# Patient Record
Sex: Female | Born: 1950 | Race: White | Hispanic: No | Marital: Married | Smoking: Never smoker
Health system: Southern US, Community
[De-identification: ages and names within clinical notes are randomized; demographics above are authoritative.]

## PROBLEM LIST (undated history)

## (undated) DIAGNOSIS — Z86718 Personal history of other venous thrombosis and embolism: Secondary | ICD-10-CM

## (undated) DIAGNOSIS — M549 Dorsalgia, unspecified: Secondary | ICD-10-CM

## (undated) DIAGNOSIS — M1990 Unspecified osteoarthritis, unspecified site: Secondary | ICD-10-CM

## (undated) DIAGNOSIS — M479 Spondylosis, unspecified: Secondary | ICD-10-CM

## (undated) DIAGNOSIS — M5126 Other intervertebral disc displacement, lumbar region: Secondary | ICD-10-CM

## (undated) DIAGNOSIS — K219 Gastro-esophageal reflux disease without esophagitis: Secondary | ICD-10-CM

## (undated) DIAGNOSIS — M4800 Spinal stenosis, site unspecified: Secondary | ICD-10-CM

## (undated) DIAGNOSIS — I10 Essential (primary) hypertension: Secondary | ICD-10-CM

## (undated) HISTORY — PX: OTHER SURGICAL HISTORY: SHX169

## (undated) HISTORY — PX: UPPER GI ENDOSCOPY: SHX6162

## (undated) HISTORY — PX: TUBAL LIGATION: SHX77

## (undated) HISTORY — PX: TOTAL KNEE ARTHROPLASTY: SHX125

## (undated) HISTORY — PX: BREAST BIOPSY: SHX20

---

## 2000-11-22 ENCOUNTER — Encounter: Payer: Self-pay | Admitting: Specialist

## 2000-12-03 ENCOUNTER — Inpatient Hospital Stay (HOSPITAL_COMMUNITY): Admission: RE | Admit: 2000-12-03 | Discharge: 2000-12-07 | Payer: Self-pay | Admitting: Specialist

## 2004-04-07 ENCOUNTER — Encounter: Admission: RE | Admit: 2004-04-07 | Discharge: 2004-04-07 | Payer: Self-pay | Admitting: General Surgery

## 2004-04-20 HISTORY — PX: JOINT REPLACEMENT: SHX530

## 2005-05-29 ENCOUNTER — Ambulatory Visit (HOSPITAL_COMMUNITY): Admission: RE | Admit: 2005-05-29 | Discharge: 2005-05-29 | Payer: Self-pay | Admitting: Unknown Physician Specialty

## 2008-06-04 ENCOUNTER — Ambulatory Visit (HOSPITAL_COMMUNITY): Admission: RE | Admit: 2008-06-04 | Discharge: 2008-06-04 | Payer: Self-pay | Admitting: Unknown Physician Specialty

## 2009-06-10 ENCOUNTER — Ambulatory Visit (HOSPITAL_COMMUNITY): Admission: RE | Admit: 2009-06-10 | Discharge: 2009-06-10 | Payer: Self-pay | Admitting: Unknown Physician Specialty

## 2010-05-20 ENCOUNTER — Other Ambulatory Visit: Payer: Self-pay

## 2010-05-20 DIAGNOSIS — Z139 Encounter for screening, unspecified: Secondary | ICD-10-CM

## 2010-06-16 ENCOUNTER — Ambulatory Visit (HOSPITAL_COMMUNITY): Payer: Self-pay

## 2010-06-16 ENCOUNTER — Ambulatory Visit (HOSPITAL_COMMUNITY)
Admission: RE | Admit: 2010-06-16 | Discharge: 2010-06-16 | Disposition: A | Discharge status: Home / Self Care | Payer: Managed Care, Other (non HMO) | Source: Ambulatory Visit | Attending: Unknown Physician Specialty | Admitting: Unknown Physician Specialty

## 2010-06-16 DIAGNOSIS — Z1231 Encounter for screening mammogram for malignant neoplasm of breast: Secondary | ICD-10-CM | POA: Insufficient documentation

## 2010-06-16 DIAGNOSIS — Z139 Encounter for screening, unspecified: Secondary | ICD-10-CM

## 2010-09-05 NOTE — Discharge Summary (Signed)
[facility]  Patient:    Danielle Gray, Danielle Gray Visit Number: [id number] MRN: [id number]          Service Type: SUR Location: [id number] Attending Physician:  Erasmo Leventhal Dictated by:   Sammuel Cooper Mahar, P.A.-C. Admit Date:  12/03/2000 Discharge Date: 12/07/2000                             Discharge Summary  DATE OF BIRTH:  [birth year]  HISTORY OF PRESENT ILLNESS:  Patient is a 60 year old female.  ADMITTING DIAGNOSES: 1. Post-traumatic right knee osteoarthritis, end stage. 2. Anxiety and depression. 3. History of superficial thrombophlebitis.  DISCHARGE DIAGNOSES: 1. Status post right total knee arthroplasty. 2. Postoperative hemorrhagic anemia which is asymptomatic and stabilized, did    not require any transfusions. 3. Anxiety and depression. 4. History of superficial thrombophlebitis.  PROCEDURE:  Right total knee arthroplasty by Dr. Valma Cava with the assistance of Ralene Bathe, P.A.-C.  ANESTHESIA USED:  General.  CONSULTS:  None.  HISTORY OF PRESENT ILLNESS:  Patient is a 60 year old female who has had progressive problems involving her right knee.  She gives a history of what sounds to be a knee injury that was treated by a knee arthroscopy in another town.  The details of the events are unknown.  However, she has been followed and, ultimately, has come to our office for further treatment involving her right knee.  She has known osteoarthritis.  Standing x-rays taken in our office shows significantly advanced arthritis, tricompartmental in nature. She had bone-on-bone deformity and is having significant pain limiting her activities of daily living.  She had undergone several interarticular injections of cortisone which have failed to relieve her pain.  She also failed on oral anti-inflammatories.  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  LABORATORY DATA:  Preadmission labs on November 22, 2000:  CBC was within normal limits.  PT and INR on the same date and PTT were 13.3, 1.0, and 36 respectively, all within normal limits.  A complete metabolic panel on November 22, 2000, was within normal limits.  UA on the same date was negative. On December 03, 2000, blood type was O, Rh type positive, antibody screen negative.  On December 04, 2000, H&H 11.4 and 32.7 respectively, dropping down to 9.8 and 28.5 on December 05, 2000.  This was the low point, did stabilize there.  On December 06, 2000, it had increased to 10.2 and 29.6 respectively. PT and INR were monitored postoperatively while on Coumadin reaching therapeutic range on December 06, 2000, of 21.0 and 2.2 respectively.  On December 04, 2000, a basic metabolic panel was within normal limits with the exception of a glucose of 144 and a calcium of 8.3.  EKG done on November 22, 2000, revealed normal sinus rhythm.  Additional comments:  No old tracing to compare.  This was confirmed by Charlton Haws, M.D.  X-rays done on November 22, 2000, of the chest revealed no active cardiopulmonary disease and x-rays of the right knee revealed severe degenerative changes.  HOSPITAL COURSE:  On December 03, 2000, the patient was taken to the operating room to undergo the above-listed procedures by Dr. Elvera Lennox. Valma Cava with the assistance of [facility], P.A.-C.  Patient was put under general anesthesia.  Patient had tolerated surgery very well.  There were no intraoperative complications and  was transferred to the recovery room in stable condition.  Initially postoperatively, the patient was placed on both Coumadin as well as Lovenox for DVT prevention.  Lovenox was continued until the patient reached a therapeutic range Coumadin.  Postoperatively, the patient was placed on appropriate antibiotics and tolerated that course well. Patient was also placed on a PCA for pain control utilizing a PCA as well  as oral analgesics.  On postoperative day #2, the patient was transferred over to oral analgesics and tolerated that well.  Pain control remained adequate throughout the entire hospital stay.  Neurovascular checks were initiated postoperatively and done periodically throughout the hospital stay and she remained intact without any problems.  Patient did work with physical therapy and occupational therapy on a total knee protocol, progressed very well, increasing up to ambulating 200 feet with only supervision prior to discharge. Patients operative dressing was taken down on postoperative day #2, revealed good looking incision without any signs of infection, no erythema or purulent discharge.  Dressing changes were done daily as the incision continued to look good and did not develop any signs of infection.  On December 07, 2000, the patient was doing very well, had met all orthopedic goals, and was medically stable and thought to be ready for discharge home.  DISCHARGE DIAGNOSIS:  Status post right total knee arthroscopy doing very well.  DISCHARGE INSTRUCTIONS:  Activity is weightbearing as tolerated to the right lower extremity, total knee protocol.  Patient may shower on postoperative day #5, dressing changes should be done daily, and the patient will utilize [place] for home health physical therapy as well as nursing.  Patient is to follow up with Dr. Thomasena Edis two weeks postoperatively, to call [phone] for an appointment for that.  Diet:  Is to resume her preoperative diet as tolerated.  MEDICATIONS ON DISCHARGE: 1. Percocet one to two tablets p.o. q.4-6h. p.r.n. pain, #40, with no refills. 2. Robaxin 500 mg one tablet p.o. q.6-8h. p.r.n. spasm, #40, with no refills. 3. Coumadin to be dosed per pharmacy and managed as an outpatient per [place].  DISPOSITION:  Patient is being discharged to her home with her family.  CONDITION ON DISCHARGE:  Stable and improved. Dictated by:    Sammuel Cooper. Mahar, P.A.-C. Attending Physician:  Erasmo Leventhal DD:  12/17/00 TD:  12/17/00 Job: [id number]

## 2010-09-05 NOTE — H&P (Signed)
[facility]  Patient:    Danielle Gray, Danielle Gray                       MRN: [id number] Attending:  R. Valma Cava, M.D. Dictator:   Ralene Bathe, P.A. CC:         Danielle Gray, M.D., [facility], [address] [place], [place], [place] [address]                         History and Physical  DATE OF BIRTH:  [birth year]  CHIEF COMPLAINT:  Right knee pain.  HISTORY OF PRESENT ILLNESS:  Danielle Gray is a 60 year old female who has had progressive problems involving her right knee.  She gives a history of what sounds to be a knee injury that was treated by a knee arthroscopy in another town.  The details of this event are unknown; however, she has been followed and ultimately has come to our office for further treatment involving her right knee.  She has known osteoarthritis.  Standing x-rays taken in our office show significantly advanced arthritis, tricompartmental in nature.  She has bone-on-bone deformity and is having significant pain limiting her activities of daily living.  She has undergone several intra-articular injections of cortisone, which have failed to relieve her pain.  She has also failed oral anti-inflammatories.  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  At this time, we have been unable to control her symptoms. Therefore, total knee arthroplasty is indicated.  Risks and benefits discussed with the patient and she is in agreement and wishes to proceed at this time.  PAST MEDICAL HISTORY: 1. Anxiety/depression. 2. History of superficial phlebitis right lower extremity x 2. 3. Gastroesophageal reflux disease. 4. Perimenopausal.  PAST SURGICAL HISTORY: 1. Right knee surgery, unknown details. 2. Bladder tacking. 3. BTL.  ALLERGIES:  No known drug allergies.  MEDICATIONS: 1. Celebrex 200 mg q.d. 2. Zoloft 100 mg 1/2 q.d. 3. Ranitidine 150 mg  b.i.d. 4. Ortho-Prefest tablet 1 q.d.  FAMILY HISTORY:  Mother with history of blood clots.  SOCIAL HISTORY:  The patient is married.  She has two children.  She does not drink nor smoke.  She lives in a one-story home with five steps into the usual entrance and has other family also to help.  Family physician is Dr. Almond Gray with [facility] in [place] who did provide medical clearance.  REVIEW OF SYSTEMS:  The patient denies any recent fevers, chills, night sweats.  No bleeding tendencies.  CNS:  No blurred or double vision, seizures, or paralysis.  RESPIRATORY:  No shortness of breath, productive cough, hemoptysis.  CARDIOVASCULAR:  No chest pain, angina, or orthopnea.  GI:  No nausea or vomiting, constipation, melena, or bloody stools.  GENITOURINARY: No dysuria or hematuria.  MUSCULOSKELETAL:  As pertinent to present illness.  PHYSICAL EXAMINATION:  GENERAL:  A well-developed, well-nourished 60 year old female.  She walks with an antalgic gait.  VITAL SIGNS:  Blood pressure today on exam is 142/88.  HEENT:  Normocephalic.  Extraocular motions intact.  NECK:  Supple.  No lymphadenopathy, no carotid bruits appreciated on exam.  CHEST:  Clear to auscultation bilaterally.  No rales or rhonchi.  HEART:  Regular rate and rhythm.  No murmurs, gallops, rubs, heaves, or thrills.  ABDOMEN:  Positive bowel sounds.  Soft and nontender.  EXTREMITIES:  Right knee as in history of present illness and bilateral lower extremities show she has some mild varicosities noted.  She is neurovascularly intact.  No pitting edema and +2 symmetric distal pulses.  IMPRESSION: 1. Post-traumatic right knee osteoarthritis, end-stage. 2. Anxiety/depression. 3. History of superficial thrombophlebitis.  PLAN:  At this time, she will be admitted for right total knee arthroplasty. Given her history of superficial phlebitis, we will avoid postoperative epidural, so as  to begin anticoagulation immediately postoperatively.  We will use also Lovenox with Coumadin for coverage of this. DD:  11/23/00 TD:  11/23/00 Job: [id number]

## 2010-09-05 NOTE — Op Note (Signed)
[facility]  Patient:    Danielle Gray, Danielle Gray                MRN: [id number] Proc. Date: 12/03/00 Adm. Date:  [id number] Attending:  Erasmo Leventhal                           Operative Report  PREOPERATIVE DIAGNOSIS:  Right knee end-stage osteoarthritis.  POSTOPERATIVE DIAGNOSIS:  Right knee end-stage osteoarthritis.  PROCEDURE:  Right total knee arthroplasty.  SURGEON:  R. Valma Cava, M.D.  ASSISTANT:  [place] Ana Shuford, P.A.-C.  ANESTHESIA:  General.  ESTIMATED BLOOD LOSS:  Less than 50 cc.  DRAINS:  Two Hemovacs.  COMPLICATIONS:  None.  OPERATIVE IMPLANTS:  Osteonics components, posterior stabilized.  Size #9 femur, size #9 tibia, 15 mm polyethylene insert, 26 mm patella, all cemented.  TOURNIQUET TIME:  1 hour and 40 minutes at 350 mmHg.  OPERATIVE DETAILS:  The patient was counseled in the holding area.  The correct site was identified, IV started.  Antibiotics were given.  She was taken to the OR and placed in the supine position with general anesthesia. The knee was examined, 5 degree flexure contracture and flexed to 110 degrees. Foley catheter was then placed utilizing sterile technique by the OR circulating nurse.  The right lower extremity elevated, prepped with DuraPrep, and draped in a sterile fashion.  Exsanguinated with an Esmarch, and the tourniquet was inflated to 350 mmHg.  A straight midline incision was made through the skin and subcutaneous tissue. Skin flaps were developed at the appropriate level.  Medial parapatellar arthrotomy was performed and medial soft tissue release was done to her varus knee.  She had end-stage osteoarthritis with large bone spurs.  These were removed.  Cruciate ligaments were resected.  Starting hole was made in the distal femur.  The canal was irrigated.  Intermedullary awl was placed and took a 10 mm cut off the distal femur.  The distal femur was found to be a size #9.   Rotational marks were made, and the distal femur was cut to fit a size #9.  Tibial eminence was resected.  Medial and lateral menisci remnants were removed.  Tenaculates were coagulated.  The proximal tibia was found to be a size #9.  Starting hole was made.  Step reamer was utilized.  The intramedullary canal was irrigated.  An intramedullary rod was gently placed. We took a total 10 mm cut based upon the lateral side with 5 degree posterior slope.  Posteromedial and posterolateral femoral osteophytes were removed under direct visualization.  The femoral trochlea was prepared in a standard fashion.  At this time, with a size #9 femur, a size #9 tibia, with a 15 mm polyethylene insert to trial with excellent range of motion and soft tissue balance and alignment.  Rotation marks were made in the proximal tibia, and the Delta keel was performed in the standard fashion.  Attention directed to the patella.  It was found to be 24 mm in thickness. Osteophytes were removed and found to be a size #26.  It was reamed to a depth of 10 mm.  Locking holes were made and excess bone was removed.  At this point in time, utilizing Modern and cement technique, all components were cemented into place.  Size #9 tibia, size #9 femur, with a 26 mm patella.  After the cement had been allowed to cure, we went through trials with the 12  and 15 mm inserts, and the 15 mm insert had the best range of motion and soft tissue balance.  At this point in time, the trial was removed, and we put in a 15 mm polyethylene insert.  Lateral release was performed, patellofemoral tracking. Patellofemoral tracking was anatomic, and she had excellent alignment.  The wounds were copiously irrigated.  Bone wax was placed on the exposed bony surfaces.  Two mini Hemovac drains were placed.  The knee was irrigated with antibiotic solution during the closure.  A sequential closure of layers was done, synovium with Vicryl, arthrotomy with  Vicryl, subcutaneous with Vicryl, skin closed with subcuticular Monocryl suture.  Benzoin and Steri-Strips were applied.  Marcaine 20 cc of 0.5% with epinephrine was placed through the drains to the knee joint.  A sterile compressive dressing was applied to the knee, tourniquet was deflated.  She was given another gram of Ancef and tourniquet deflated.  Sponge, needle, and instrument counts were correct.  She had normal pulse in the foot and ankle at the end of the case.  She was awakened and extubated and taken from the operating room to the PACU in stable condition.  There were no complications. DD:  12/03/00 TD:  12/04/00 Job: [id number]

## 2011-06-17 ENCOUNTER — Other Ambulatory Visit (HOSPITAL_COMMUNITY): Payer: Self-pay | Admitting: Unknown Physician Specialty

## 2011-06-17 DIAGNOSIS — Z139 Encounter for screening, unspecified: Secondary | ICD-10-CM

## 2011-07-03 ENCOUNTER — Ambulatory Visit (HOSPITAL_COMMUNITY)
Admission: RE | Admit: 2011-07-03 | Discharge: 2011-07-03 | Disposition: A | Discharge status: Home / Self Care | Payer: Managed Care, Other (non HMO) | Source: Ambulatory Visit | Attending: Unknown Physician Specialty | Admitting: Unknown Physician Specialty

## 2011-07-03 ENCOUNTER — Ambulatory Visit (HOSPITAL_COMMUNITY): Payer: Managed Care, Other (non HMO)

## 2011-07-03 DIAGNOSIS — Z1231 Encounter for screening mammogram for malignant neoplasm of breast: Secondary | ICD-10-CM | POA: Insufficient documentation

## 2011-07-03 DIAGNOSIS — Z139 Encounter for screening, unspecified: Secondary | ICD-10-CM

## 2012-07-11 ENCOUNTER — Other Ambulatory Visit (HOSPITAL_COMMUNITY): Payer: Self-pay | Admitting: Unknown Physician Specialty

## 2012-07-11 DIAGNOSIS — Z139 Encounter for screening, unspecified: Secondary | ICD-10-CM

## 2012-08-05 ENCOUNTER — Ambulatory Visit (HOSPITAL_COMMUNITY): Payer: Managed Care, Other (non HMO)

## 2012-08-09 ENCOUNTER — Ambulatory Visit (HOSPITAL_COMMUNITY): Payer: Managed Care, Other (non HMO)

## 2012-08-15 ENCOUNTER — Ambulatory Visit (HOSPITAL_COMMUNITY)
Admission: RE | Admit: 2012-08-15 | Discharge: 2012-08-15 | Disposition: A | Discharge status: Home / Self Care | Payer: Managed Care, Other (non HMO) | Source: Ambulatory Visit | Attending: Unknown Physician Specialty | Admitting: Unknown Physician Specialty

## 2012-08-15 DIAGNOSIS — Z1231 Encounter for screening mammogram for malignant neoplasm of breast: Secondary | ICD-10-CM | POA: Insufficient documentation

## 2012-08-15 DIAGNOSIS — Z139 Encounter for screening, unspecified: Secondary | ICD-10-CM

## 2012-08-18 ENCOUNTER — Other Ambulatory Visit: Payer: Self-pay | Admitting: Unknown Physician Specialty

## 2012-08-18 DIAGNOSIS — R928 Other abnormal and inconclusive findings on diagnostic imaging of breast: Secondary | ICD-10-CM

## 2012-08-31 ENCOUNTER — Other Ambulatory Visit: Payer: Self-pay | Admitting: Unknown Physician Specialty

## 2012-08-31 ENCOUNTER — Ambulatory Visit
Admission: RE | Admit: 2012-08-31 | Discharge: 2012-08-31 | Disposition: A | Discharge status: Home / Self Care | Payer: Managed Care, Other (non HMO) | Source: Ambulatory Visit | Attending: Unknown Physician Specialty | Admitting: Unknown Physician Specialty

## 2012-08-31 DIAGNOSIS — R928 Other abnormal and inconclusive findings on diagnostic imaging of breast: Secondary | ICD-10-CM

## 2012-10-10 ENCOUNTER — Other Ambulatory Visit: Payer: Self-pay | Admitting: Unknown Physician Specialty

## 2012-10-10 DIAGNOSIS — N6310 Unspecified lump in the right breast, unspecified quadrant: Secondary | ICD-10-CM

## 2013-02-17 ENCOUNTER — Other Ambulatory Visit (HOSPITAL_COMMUNITY): Payer: Self-pay | Admitting: Unknown Physician Specialty

## 2013-02-17 DIAGNOSIS — N6310 Unspecified lump in the right breast, unspecified quadrant: Secondary | ICD-10-CM

## 2013-03-08 ENCOUNTER — Ambulatory Visit (HOSPITAL_COMMUNITY)
Admission: RE | Admit: 2013-03-08 | Discharge: 2013-03-08 | Disposition: A | Discharge status: Home / Self Care | Payer: Managed Care, Other (non HMO) | Source: Ambulatory Visit | Attending: Unknown Physician Specialty | Admitting: Unknown Physician Specialty

## 2013-03-08 DIAGNOSIS — N63 Unspecified lump in unspecified breast: Secondary | ICD-10-CM | POA: Insufficient documentation

## 2013-03-08 DIAGNOSIS — N6310 Unspecified lump in the right breast, unspecified quadrant: Secondary | ICD-10-CM

## 2013-03-08 DIAGNOSIS — Z09 Encounter for follow-up examination after completed treatment for conditions other than malignant neoplasm: Secondary | ICD-10-CM | POA: Insufficient documentation

## 2013-09-21 ENCOUNTER — Other Ambulatory Visit (HOSPITAL_COMMUNITY): Payer: Self-pay | Admitting: Unknown Physician Specialty

## 2013-09-21 DIAGNOSIS — R928 Other abnormal and inconclusive findings on diagnostic imaging of breast: Secondary | ICD-10-CM

## 2013-09-27 ENCOUNTER — Ambulatory Visit (HOSPITAL_COMMUNITY)
Admission: RE | Admit: 2013-09-27 | Discharge: 2013-09-27 | Disposition: A | Discharge status: Home / Self Care | Payer: Managed Care, Other (non HMO) | Source: Ambulatory Visit | Attending: Unknown Physician Specialty | Admitting: Unknown Physician Specialty

## 2013-09-27 ENCOUNTER — Other Ambulatory Visit (HOSPITAL_COMMUNITY): Payer: Self-pay | Admitting: Unknown Physician Specialty

## 2013-09-27 DIAGNOSIS — R928 Other abnormal and inconclusive findings on diagnostic imaging of breast: Secondary | ICD-10-CM

## 2013-09-27 DIAGNOSIS — N641 Fat necrosis of breast: Secondary | ICD-10-CM | POA: Insufficient documentation

## 2014-12-21 ENCOUNTER — Other Ambulatory Visit (HOSPITAL_COMMUNITY): Payer: Self-pay | Admitting: Unknown Physician Specialty

## 2014-12-21 DIAGNOSIS — Z1231 Encounter for screening mammogram for malignant neoplasm of breast: Secondary | ICD-10-CM

## 2015-01-03 ENCOUNTER — Ambulatory Visit (HOSPITAL_COMMUNITY)
Admission: RE | Admit: 2015-01-03 | Discharge: 2015-01-03 | Disposition: A | Discharge status: Home / Self Care | Payer: Managed Care, Other (non HMO) | Source: Ambulatory Visit | Attending: Unknown Physician Specialty | Admitting: Unknown Physician Specialty

## 2015-01-03 DIAGNOSIS — Z1231 Encounter for screening mammogram for malignant neoplasm of breast: Secondary | ICD-10-CM | POA: Diagnosis present

## 2015-04-11 ENCOUNTER — Other Ambulatory Visit: Payer: Self-pay | Admitting: Orthopedic Surgery

## 2015-04-11 DIAGNOSIS — M48061 Spinal stenosis, lumbar region without neurogenic claudication: Secondary | ICD-10-CM

## 2015-04-26 ENCOUNTER — Inpatient Hospital Stay
Admission: RE | Admit: 2015-04-26 | Discharge: 2015-04-26 | Disposition: A | Discharge status: Home / Self Care | Payer: Self-pay | Source: Ambulatory Visit | Attending: Orthopedic Surgery | Admitting: Orthopedic Surgery

## 2015-04-26 ENCOUNTER — Other Ambulatory Visit: Payer: Self-pay | Admitting: Orthopedic Surgery

## 2015-04-26 ENCOUNTER — Ambulatory Visit
Admission: RE | Admit: 2015-04-26 | Discharge: 2015-04-26 | Disposition: A | Discharge status: Home / Self Care | Payer: Managed Care, Other (non HMO) | Source: Ambulatory Visit | Attending: Orthopedic Surgery | Admitting: Orthopedic Surgery

## 2015-04-26 DIAGNOSIS — R52 Pain, unspecified: Secondary | ICD-10-CM

## 2015-04-26 DIAGNOSIS — M48061 Spinal stenosis, lumbar region without neurogenic claudication: Secondary | ICD-10-CM

## 2015-04-26 MED ORDER — DIAZEPAM 5 MG PO TABS
5.0000 mg | ORAL_TABLET | Freq: Once | ORAL | Status: AC
  Administered 2015-04-26: 5 mg via ORAL

## 2015-04-26 MED ORDER — IOHEXOL 180 MG/ML  SOLN
15.0000 mL | Freq: Once | INTRAMUSCULAR | Status: AC | PRN
  Administered 2015-04-26: 15 mL via INTRATHECAL

## 2015-04-26 MED ORDER — MEPERIDINE HCL 100 MG/ML IJ SOLN
75.0000 mg | Freq: Once | INTRAMUSCULAR | Status: AC
  Administered 2015-04-26: 75 mg via INTRAMUSCULAR

## 2015-04-26 MED ORDER — ONDANSETRON HCL 4 MG/2ML IJ SOLN
4.0000 mg | Freq: Once | INTRAMUSCULAR | Status: AC
Start: 2015-04-26 — End: 2015-04-26
  Administered 2015-04-26: 4 mg via INTRAMUSCULAR

## 2015-04-26 NOTE — Discharge Instructions (Signed)
Myelogram Discharge Instructions  1. Go home and rest quietly for the next 24 hours.  It is important to lie flat for the next 24 hours.  Get up only to go to the restroom.  You may lie in the bed or on a couch on your back, your stomach, your left side or your right side.  You may have one pillow under your head.  You may have pillows between your knees while you are on your side or under your knees while you are on your back.  2. DO NOT drive today.  Recline the seat as far back as it will go, while still wearing your seat belt, on the way home.  3. You may get up to go to the bathroom as needed.  You may sit up for 10 minutes to eat.  You may resume your normal diet and medications unless otherwise indicated.  Drink lots of extra fluids today and tomorrow.  4. The incidence of headache, nausea, or vomiting is about 5% (one in 20 patients).  If you develop a headache, lie flat and drink plenty of fluids until the headache goes away.  Caffeinated beverages may be helpful.  If you develop severe nausea and vomiting or a headache that does not go away with flat bed rest, call [phone].  5. You may resume normal activities after your 24 hours of bed rest is over; however, do not exert yourself strongly or do any heavy lifting tomorrow. If when you get up you have a headache when standing, go back to bed and force fluids for another 24 hours.  6. Call your physician for a follow-up appointment.  The results of your myelogram will be sent directly to your physician by the following day.  7. If you have any questions or if complications develop after you arrive home, please call [phone].  Discharge instructions have been explained to the patient.  The patient, or the person responsible for the patient, fully understands these instructions.       May resume Tramadol on Apr 27, 2015, after 11:00 am.

## 2015-05-07 ENCOUNTER — Other Ambulatory Visit: Payer: Self-pay | Admitting: Surgical

## 2015-05-14 NOTE — Patient Instructions (Addendum)
YOUR PROCEDURE IS SCHEDULED ON :  05/22/15  REPORT TO [facility] MAIN ENTRANCE FOLLOW SIGNS TO EAST ELEVATOR - GO TO 3rd FLOOR CHECK IN AT 3 EAST NURSES STATION (SHORT STAY) AT:  6:30 AM  CALL THIS NUMBER IF YOU HAVE PROBLEMS THE MORNING OF SURGERY [phone]  REMEMBER:ONLY 1 PER PERSON MAY GO TO SHORT STAY WITH YOU TO GET READY THE MORNING OF YOUR SURGERY  DO NOT EAT FOOD OR DRINK LIQUIDS AFTER MIDNIGHT  TAKE THESE MEDICINES THE MORNING OF SURGERY: OXYCODONE  YOU MAY NOT HAVE ANY METAL ON YOUR BODY INCLUDING HAIR PINS AND PIERCING'S. DO NOT WEAR JEWELRY, MAKEUP, LOTIONS, POWDERS OR PERFUMES. DO NOT WEAR NAIL POLISH. DO NOT SHAVE 48 HRS PRIOR TO SURGERY. MEN MAY SHAVE FACE AND NECK.  DO NOT [place]. [facility] IS NOT RESPONSIBLE FOR VALUABLES.  CONTACTS, DENTURES OR PARTIALS MAY NOT BE WORN TO SURGERY. LEAVE SUITCASE IN CAR. CAN BE BROUGHT TO ROOM AFTER SURGERY.  PATIENTS DISCHARGED THE DAY OF SURGERY WILL NOT BE ALLOWED TO DRIVE HOME.  PLEASE READ OVER THE FOLLOWING INSTRUCTION SHEETS _________________________________________________________________________________                                          [facility] - PREPARING FOR SURGERY  Before surgery, you can play an important role.  Because skin is not sterile, your skin needs to be as free of germs as possible.  You can reduce the number of germs on your skin by washing with CHG (chlorahexidine gluconate) soap before surgery.  CHG is an antiseptic cleaner which kills germs and bonds with the skin to continue killing germs even after washing. Please DO NOT use if you have an allergy to CHG or antibacterial soaps.  If your skin becomes reddened/irritated stop using the CHG and inform your nurse when you arrive at Short Stay. Do not shave (including legs and underarms) for at least 48 hours prior to the first CHG shower.  You may shave your face. Please follow these instructions  carefully:   1.  Shower with CHG Soap the night before surgery and the  morning of Surgery.   2.  If you choose to wash your hair, wash your hair first as usual with your  normal  Shampoo.   3.  After you shampoo, rinse your hair and body thoroughly to remove the  shampoo.                                         4.  Use CHG as you would any other liquid soap.  You can apply chg directly  to the skin and wash . Gently wash with scrungie or clean wascloth    5.  Apply the CHG Soap to your body ONLY FROM THE NECK DOWN.   Do not use on open                           Wound or open sores. Avoid contact with eyes, ears mouth and genitals (private parts).                        Genitals (private parts) with your normal soap.  6.  Wash thoroughly, paying special attention to the area where your surgery  will be performed.   7.  Thoroughly rinse your body with warm water from the neck down.   8.  DO NOT shower/wash with your normal soap after using and rinsing off  the CHG Soap .                9.  Pat yourself dry with a clean towel.             10.  Wear clean night clothes to bed after shower             11.  Place clean sheets on your bed the night of your first shower and do not  sleep with pets.  Day of Surgery : Do not apply any lotions/deodorants the morning of surgery.  Please wear clean clothes to the hospital/surgery center.  FAILURE TO FOLLOW THESE INSTRUCTIONS MAY RESULT IN THE CANCELLATION OF YOUR SURGERY    PATIENT SIGNATURE_________________________________  ______________________________________________________________________     Danielle Gray  An incentive spirometer is a tool that can help keep your lungs clear and active. This tool measures how well you are filling your lungs with each breath. Taking long deep breaths may help reverse or decrease the chance of developing breathing (pulmonary) problems (especially infection) following:  A long  period of time when you are unable to move or be active. BEFORE THE PROCEDURE   If the spirometer includes an indicator to show your best effort, your nurse or respiratory therapist will set it to a desired goal.  If possible, sit up straight or lean slightly forward. Try not to slouch.  Hold the incentive spirometer in an upright position. INSTRUCTIONS FOR USE  1. Sit on the edge of your bed if possible, or sit up as far as you can in bed or on a chair. 2. Hold the incentive spirometer in an upright position. 3. Breathe out normally. 4. Place the mouthpiece in your mouth and seal your lips tightly around it. 5. Breathe in slowly and as deeply as possible, raising the piston or the ball toward the top of the column. 6. Hold your breath for 3-5 seconds or for as long as possible. Allow the piston or ball to fall to the bottom of the column. 7. Remove the mouthpiece from your mouth and breathe out normally. 8. Rest for a few seconds and repeat Steps 1 through 7 at least 10 times every 1-2 hours when you are awake. Take your time and take a few normal breaths between deep breaths. 9. The spirometer may include an indicator to show your best effort. Use the indicator as a goal to work toward during each repetition. 10. After each set of 10 deep breaths, practice coughing to be sure your lungs are clear. If you have an incision (the cut made at the time of surgery), support your incision when coughing by placing a pillow or rolled up towels firmly against it. Once you are able to get out of bed, walk around indoors and cough well. You may stop using the incentive spirometer when instructed by your caregiver.  RISKS AND COMPLICATIONS  Take your time so you do not get dizzy or light-headed.  If you are in pain, you may need to take or ask for pain medication before doing incentive spirometry. It is harder to take a deep breath if you are having pain. AFTER USE  Rest and breathe slowly and  easily.  It can be helpful to keep track of a log of your progress. Your caregiver can provide you with a simple table to help with this. If you are using the spirometer at home, follow these instructions: [place] IF:   You are having difficultly using the spirometer.  You have trouble using the spirometer as often as instructed.  Your pain medication is not giving enough relief while using the spirometer.  You develop fever of 100.5 F (38.1 C) or higher. SEEK IMMEDIATE MEDICAL CARE IF:   You cough up bloody sputum that had not been present before.  You develop fever of 102 F (38.9 C) or greater.  You develop worsening pain at or near the incision site. MAKE SURE YOU:   Understand these instructions.  Will watch your condition.  Will get help right away if you are not doing well or get worse. Document Released: 08/17/2006 Document Revised: 06/29/2011 Document Reviewed: 10/18/2006 [facility] Patient Information 2014 [place], [place].   ________________________________________________________________________

## 2015-05-15 ENCOUNTER — Encounter (HOSPITAL_COMMUNITY)
Admission: RE | Admit: 2015-05-15 | Discharge: 2015-05-15 | Disposition: A | Discharge status: Home / Self Care | Payer: Managed Care, Other (non HMO) | Source: Ambulatory Visit | Attending: Orthopedic Surgery | Admitting: Orthopedic Surgery

## 2015-05-15 ENCOUNTER — Ambulatory Visit (HOSPITAL_COMMUNITY)
Admission: RE | Admit: 2015-05-15 | Discharge: 2015-05-15 | Disposition: A | Discharge status: Home / Self Care | Payer: Managed Care, Other (non HMO) | Source: Ambulatory Visit | Attending: Surgical | Admitting: Surgical

## 2015-05-15 ENCOUNTER — Encounter (HOSPITAL_COMMUNITY): Payer: Self-pay

## 2015-05-15 DIAGNOSIS — Z01818 Encounter for other preprocedural examination: Secondary | ICD-10-CM | POA: Diagnosis present

## 2015-05-15 DIAGNOSIS — M5136 Other intervertebral disc degeneration, lumbar region: Secondary | ICD-10-CM | POA: Diagnosis not present

## 2015-05-15 HISTORY — DX: Unspecified osteoarthritis, unspecified site: M19.90

## 2015-05-15 HISTORY — DX: Dorsalgia, unspecified: M54.9

## 2015-05-15 HISTORY — DX: Personal history of other venous thrombosis and embolism: Z86.718

## 2015-05-15 HISTORY — DX: Essential (primary) hypertension: I10

## 2015-05-15 HISTORY — DX: Other intervertebral disc displacement, lumbar region: M51.26

## 2015-05-15 HISTORY — DX: Gastro-esophageal reflux disease without esophagitis: K21.9

## 2015-05-15 LAB — CBC WITH DIFFERENTIAL/PLATELET
Basophils Absolute: 0 10*3/uL (ref 0.0–0.1)
Basophils Relative: 0 %
Eosinophils Absolute: 0.2 10*3/uL (ref 0.0–0.7)
Eosinophils Relative: 2 %
HCT: 40.8 % (ref 36.0–46.0)
Hemoglobin: 13.5 g/dL (ref 12.0–15.0)
Lymphocytes Relative: 22 %
Lymphs Abs: 2.4 10*3/uL (ref 0.7–4.0)
MCH: 29.9 pg (ref 26.0–34.0)
MCHC: 33.1 g/dL (ref 30.0–36.0)
MCV: 90.3 fL (ref 78.0–100.0)
Monocytes Absolute: 0.7 10*3/uL (ref 0.1–1.0)
Monocytes Relative: 6 %
Neutro Abs: 7.7 10*3/uL (ref 1.7–7.7)
Neutrophils Relative %: 70 %
Platelets: 279 10*3/uL (ref 150–400)
RBC: 4.52 MIL/uL (ref 3.87–5.11)
RDW: 12.3 % (ref 11.5–15.5)
WBC: 11.1 10*3/uL — ABNORMAL HIGH (ref 4.0–10.5)

## 2015-05-15 LAB — COMPREHENSIVE METABOLIC PANEL
ALT: 21 U/L (ref 14–54)
AST: 21 U/L (ref 15–41)
Albumin: 4 g/dL (ref 3.5–5.0)
Alkaline Phosphatase: 78 U/L (ref 38–126)
Anion gap: 10 (ref 5–15)
BUN: 18 mg/dL (ref 6–20)
CO2: 28 mmol/L (ref 22–32)
Calcium: 9.7 mg/dL (ref 8.9–10.3)
Chloride: 104 mmol/L (ref 101–111)
Creatinine, Ser: 0.8 mg/dL (ref 0.44–1.00)
GFR calc Af Amer: >60 mL/min (ref >60)
GFR calc non Af Amer: >60 mL/min (ref >60)
Glucose, Bld: 106 mg/dL — ABNORMAL HIGH (ref 65–99)
Potassium: 4 mmol/L (ref 3.5–5.1)
Sodium: 142 mmol/L (ref 135–145)
Total Bilirubin: 0.3 mg/dL (ref 0.3–1.2)
Total Protein: 6.9 g/dL (ref 6.5–8.1)

## 2015-05-15 LAB — SURGICAL PCR SCREEN
MRSA, PCR: NEGATIVE
STAPHYLOCOCCUS AUREUS: POSITIVE — AB

## 2015-05-15 LAB — PROTIME-INR
INR: 1.09 (ref 0.00–1.49)
Prothrombin Time: 13.9 seconds (ref 11.6–15.2)

## 2015-05-15 LAB — APTT: aPTT: 34 seconds (ref 24–37)

## 2015-05-15 NOTE — Progress Notes (Signed)
Rx Mupirocin called to [place] [id number] Pt notified

## 2015-05-21 NOTE — Anesthesia Preprocedure Evaluation (Addendum)
Anesthesia Evaluation  Patient identified by MRN, date of birth, ID band Patient awake    Reviewed: Allergy & Precautions, H&P , NPO status , Patient's Chart, lab work & pertinent test results  Airway Mallampati: II  TM Distance: >3 FB Neck ROM: full    Dental  (+) Dental Advisory Given, Caps Left upper front capped:   Pulmonary neg pulmonary ROS,    Pulmonary exam normal breath sounds clear to auscultation       Cardiovascular Exercise Tolerance: Good hypertension, Pt. on medications Normal cardiovascular exam Rhythm:regular Rate:Normal     Neuro/Psych negative neurological ROS  negative psych ROS   GI/Hepatic negative GI ROS, Neg liver ROS, GERD  Medicated and Controlled,  Endo/Other  negative endocrine ROS  Renal/GU negative Renal ROS  negative genitourinary   Musculoskeletal   Abdominal   Peds  Hematology negative hematology ROS (+)   Anesthesia Other Findings   Reproductive/Obstetrics negative OB ROS                            Anesthesia Physical Anesthesia Plan  ASA: II  Anesthesia Plan: General   Post-op Pain Management:    Induction: Intravenous  Airway Management Planned: Oral ETT  Additional Equipment:   Intra-op Plan:   Post-operative Plan: Extubation in OR  Informed Consent: I have reviewed the patients History and Physical, chart, labs and discussed the procedure including the risks, benefits and alternatives for the proposed anesthesia with the patient or authorized representative who has indicated his/her understanding and acceptance.   Dental Advisory Given  Plan Discussed with: CRNA and Surgeon  Anesthesia Plan Comments:         Anesthesia Quick Evaluation

## 2015-05-22 ENCOUNTER — Ambulatory Visit (HOSPITAL_COMMUNITY): Payer: Managed Care, Other (non HMO) | Admitting: Anesthesiology

## 2015-05-22 ENCOUNTER — Encounter (HOSPITAL_COMMUNITY)
Admission: RE | Disposition: A | Discharge status: Home / Self Care | Payer: Self-pay | Source: Ambulatory Visit | Attending: Orthopedic Surgery

## 2015-05-22 ENCOUNTER — Ambulatory Visit (HOSPITAL_COMMUNITY): Payer: Managed Care, Other (non HMO)

## 2015-05-22 ENCOUNTER — Encounter (HOSPITAL_COMMUNITY): Payer: Self-pay | Admitting: *Deleted

## 2015-05-22 ENCOUNTER — Observation Stay (HOSPITAL_COMMUNITY)
Admission: RE | Admit: 2015-05-22 | Discharge: 2015-05-24 | Disposition: A | Discharge status: Home / Self Care | Payer: Managed Care, Other (non HMO) | Source: Ambulatory Visit | Attending: Orthopedic Surgery | Admitting: Orthopedic Surgery

## 2015-05-22 DIAGNOSIS — M1990 Unspecified osteoarthritis, unspecified site: Secondary | ICD-10-CM | POA: Insufficient documentation

## 2015-05-22 DIAGNOSIS — M5126 Other intervertebral disc displacement, lumbar region: Secondary | ICD-10-CM | POA: Diagnosis not present

## 2015-05-22 DIAGNOSIS — K219 Gastro-esophageal reflux disease without esophagitis: Secondary | ICD-10-CM | POA: Insufficient documentation

## 2015-05-22 DIAGNOSIS — Z79899 Other long term (current) drug therapy: Secondary | ICD-10-CM | POA: Insufficient documentation

## 2015-05-22 DIAGNOSIS — I10 Essential (primary) hypertension: Secondary | ICD-10-CM | POA: Diagnosis not present

## 2015-05-22 DIAGNOSIS — Z419 Encounter for procedure for purposes other than remedying health state, unspecified: Secondary | ICD-10-CM

## 2015-05-22 DIAGNOSIS — Z79891 Long term (current) use of opiate analgesic: Secondary | ICD-10-CM | POA: Insufficient documentation

## 2015-05-22 DIAGNOSIS — M48062 Spinal stenosis, lumbar region with neurogenic claudication: Secondary | ICD-10-CM | POA: Diagnosis present

## 2015-05-22 DIAGNOSIS — M21372 Foot drop, left foot: Secondary | ICD-10-CM | POA: Diagnosis not present

## 2015-05-22 DIAGNOSIS — M4806 Spinal stenosis, lumbar region: Secondary | ICD-10-CM | POA: Diagnosis not present

## 2015-05-22 DIAGNOSIS — Z96651 Presence of right artificial knee joint: Secondary | ICD-10-CM | POA: Insufficient documentation

## 2015-05-22 DIAGNOSIS — M549 Dorsalgia, unspecified: Secondary | ICD-10-CM | POA: Diagnosis present

## 2015-05-22 DIAGNOSIS — Z86718 Personal history of other venous thrombosis and embolism: Secondary | ICD-10-CM | POA: Insufficient documentation

## 2015-05-22 HISTORY — PX: LUMBAR LAMINECTOMY/DECOMPRESSION MICRODISCECTOMY: SHX5026

## 2015-05-22 SURGERY — LUMBAR LAMINECTOMY/DECOMPRESSION MICRODISCECTOMY 1 LEVEL
Anesthesia: General | Site: Spine Lumbar | Laterality: Left

## 2015-05-22 MED ORDER — ONDANSETRON HCL 4 MG/2ML IJ SOLN
INTRAMUSCULAR | Status: AC
  Filled 2015-05-22: qty 4

## 2015-05-22 MED ORDER — LIDOCAINE HCL (CARDIAC) 20 MG/ML IV SOLN
INTRAVENOUS | Status: AC
  Filled 2015-05-22: qty 5

## 2015-05-22 MED ORDER — HYDROMORPHONE HCL 1 MG/ML IJ SOLN
0.2500 mg | INTRAMUSCULAR | Status: DC | PRN
  Administered 2015-05-22 (×4): 0.5 mg via INTRAVENOUS

## 2015-05-22 MED ORDER — SUGAMMADEX SODIUM 200 MG/2ML IV SOLN
INTRAVENOUS | Status: DC | PRN
  Administered 2015-05-22: 200 mg via INTRAVENOUS

## 2015-05-22 MED ORDER — HYDROMORPHONE HCL 1 MG/ML IJ SOLN
INTRAMUSCULAR | Status: AC
  Filled 2015-05-22: qty 1

## 2015-05-22 MED ORDER — POLYETHYLENE GLYCOL 3350 17 G PO PACK: 17.0000 g | PACK | Freq: Every day | ORAL | Status: DC | PRN

## 2015-05-22 MED ORDER — ONDANSETRON HCL 4 MG/2ML IJ SOLN
INTRAMUSCULAR | Status: DC | PRN
  Administered 2015-05-22: 2 mg via INTRAVENOUS
  Administered 2015-05-22: 4 mg via INTRAVENOUS
  Administered 2015-05-22: 2 mg via INTRAVENOUS

## 2015-05-22 MED ORDER — BACITRACIN-NEOMYCIN-POLYMYXIN 400-5-5000 EX OINT
TOPICAL_OINTMENT | CUTANEOUS | Status: AC
  Filled 2015-05-22: qty 1

## 2015-05-22 MED ORDER — SUGAMMADEX SODIUM 200 MG/2ML IV SOLN
INTRAVENOUS | Status: AC
  Filled 2015-05-22: qty 2

## 2015-05-22 MED ORDER — PROMETHAZINE HCL 25 MG/ML IJ SOLN
INTRAMUSCULAR | Status: DC
Start: 2015-05-22 — End: 2015-05-22
  Filled 2015-05-22: qty 1

## 2015-05-22 MED ORDER — CEFAZOLIN SODIUM-DEXTROSE 2-3 GM-% IV SOLR
INTRAVENOUS | Status: AC
  Filled 2015-05-22: qty 50

## 2015-05-22 MED ORDER — ACETAMINOPHEN 10 MG/ML IV SOLN
INTRAVENOUS | Status: AC
  Filled 2015-05-22: qty 100

## 2015-05-22 MED ORDER — OXYCODONE-ACETAMINOPHEN 5-325 MG PO TABS
1.0000 | ORAL_TABLET | ORAL | Status: DC | PRN
  Administered 2015-05-22: 1 via ORAL
  Administered 2015-05-23 (×2): 2 via ORAL
  Administered 2015-05-23: 1 via ORAL
  Administered 2015-05-23 – 2015-05-24 (×6): 2 via ORAL
  Filled 2015-05-22 (×2): qty 2
  Filled 2015-05-22: qty 1
  Filled 2015-05-22 (×7): qty 2

## 2015-05-22 MED ORDER — METHOCARBAMOL 500 MG PO TABS
500.0000 mg | ORAL_TABLET | Freq: Four times a day (QID) | ORAL | Status: DC | PRN
  Administered 2015-05-23 – 2015-05-24 (×6): 500 mg via ORAL
  Filled 2015-05-22 (×6): qty 1

## 2015-05-22 MED ORDER — MIDAZOLAM HCL 2 MG/2ML IJ SOLN
INTRAMUSCULAR | Status: AC
  Filled 2015-05-22: qty 2

## 2015-05-22 MED ORDER — PROMETHAZINE HCL 25 MG/ML IJ SOLN
6.2500 mg | INTRAMUSCULAR | Status: DC | PRN
Start: 2015-05-22 — End: 2015-05-22

## 2015-05-22 MED ORDER — BUPIVACAINE-EPINEPHRINE (PF) 0.5% -1:200000 IJ SOLN
INTRAMUSCULAR | Status: AC
  Filled 2015-05-22: qty 30

## 2015-05-22 MED ORDER — PROPOFOL 10 MG/ML IV BOLUS
INTRAVENOUS | Status: AC
  Filled 2015-05-22: qty 20

## 2015-05-22 MED ORDER — DEXAMETHASONE SODIUM PHOSPHATE 10 MG/ML IJ SOLN
INTRAMUSCULAR | Status: DC | PRN
  Administered 2015-05-22: 5 mg via INTRAVENOUS

## 2015-05-22 MED ORDER — FENTANYL CITRATE (PF) 100 MCG/2ML IJ SOLN
INTRAMUSCULAR | Status: DC | PRN
  Administered 2015-05-22 (×5): 50 ug via INTRAVENOUS

## 2015-05-22 MED ORDER — ONDANSETRON HCL 4 MG/2ML IJ SOLN
4.0000 mg | INTRAMUSCULAR | Status: DC | PRN
  Administered 2015-05-23: 4 mg via INTRAVENOUS
  Filled 2015-05-22: qty 2

## 2015-05-22 MED ORDER — BUPIVACAINE-EPINEPHRINE 0.5% -1:200000 IJ SOLN
INTRAMUSCULAR | Status: DC | PRN
  Administered 2015-05-22: 20 mL

## 2015-05-22 MED ORDER — HYDROCHLOROTHIAZIDE 12.5 MG PO CAPS
12.5000 mg | ORAL_CAPSULE | Freq: Every day | ORAL | Status: DC
  Administered 2015-05-22: 12.5 mg via ORAL
  Filled 2015-05-22 (×3): qty 1

## 2015-05-22 MED ORDER — MENTHOL 3 MG MT LOZG: 1.0000 | LOZENGE | OROMUCOSAL | Status: DC | PRN

## 2015-05-22 MED ORDER — ACETAMINOPHEN 325 MG PO TABS: 650.0000 mg | ORAL_TABLET | ORAL | Status: DC | PRN

## 2015-05-22 MED ORDER — CEFAZOLIN SODIUM 1-5 GM-% IV SOLN
1.0000 g | Freq: Three times a day (TID) | INTRAVENOUS | Status: AC
Start: 2015-05-22 — End: 2015-05-23
  Administered 2015-05-22 – 2015-05-23 (×3): 1 g via INTRAVENOUS
  Filled 2015-05-22 (×3): qty 50

## 2015-05-22 MED ORDER — THROMBIN 5000 UNITS EX SOLR
OROMUCOSAL | Status: DC | PRN
  Administered 2015-05-22: 10 mL via TOPICAL

## 2015-05-22 MED ORDER — ROCURONIUM BROMIDE 100 MG/10ML IV SOLN
INTRAVENOUS | Status: DC | PRN
  Administered 2015-05-22: 50 mg via INTRAVENOUS
  Administered 2015-05-22: 10 mg via INTRAVENOUS

## 2015-05-22 MED ORDER — LISINOPRIL-HYDROCHLOROTHIAZIDE 10-12.5 MG PO TABS: 1.0000 | ORAL_TABLET | Freq: Every morning | ORAL | Status: DC

## 2015-05-22 MED ORDER — LACTATED RINGERS IV SOLN
INTRAVENOUS | Status: DC
  Administered 2015-05-22 (×2): via INTRAVENOUS

## 2015-05-22 MED ORDER — BACITRACIN-NEOMYCIN-POLYMYXIN 400-5-5000 EX OINT
TOPICAL_OINTMENT | CUTANEOUS | Status: DC | PRN
  Administered 2015-05-22: 1 via TOPICAL

## 2015-05-22 MED ORDER — MIDAZOLAM HCL 2 MG/2ML IJ SOLN
0.5000 mg | INTRAMUSCULAR | Status: DC | PRN
  Administered 2015-05-22 (×2): 0.5 mg via INTRAVENOUS

## 2015-05-22 MED ORDER — PROPOFOL 10 MG/ML IV BOLUS
INTRAVENOUS | Status: DC | PRN
  Administered 2015-05-22: 200 mg via INTRAVENOUS

## 2015-05-22 MED ORDER — LACTATED RINGERS IV SOLN: INTRAVENOUS | Status: DC

## 2015-05-22 MED ORDER — SODIUM CHLORIDE 0.9 % IR SOLN
Status: DC | PRN
  Administered 2015-05-22: 500 mL

## 2015-05-22 MED ORDER — CHLORHEXIDINE GLUCONATE 4 % EX LIQD: 60.0000 mL | Freq: Once | CUTANEOUS | Status: DC

## 2015-05-22 MED ORDER — BISACODYL 5 MG PO TBEC: 5.0000 mg | DELAYED_RELEASE_TABLET | Freq: Every day | ORAL | Status: DC | PRN

## 2015-05-22 MED ORDER — HYDROMORPHONE HCL 1 MG/ML IJ SOLN
0.2500 mg | INTRAMUSCULAR | Status: DC | PRN
  Administered 2015-05-22 (×2): 0.5 mg via INTRAVENOUS

## 2015-05-22 MED ORDER — PHENOL 1.4 % MT LIQD
1.0000 | OROMUCOSAL | Status: DC | PRN
  Filled 2015-05-22: qty 177

## 2015-05-22 MED ORDER — PHENYLEPHRINE 40 MCG/ML (10ML) SYRINGE FOR IV PUSH (FOR BLOOD PRESSURE SUPPORT)
PREFILLED_SYRINGE | INTRAVENOUS | Status: AC
  Filled 2015-05-22: qty 10

## 2015-05-22 MED ORDER — LISINOPRIL 10 MG PO TABS
10.0000 mg | ORAL_TABLET | Freq: Every day | ORAL | Status: DC
  Administered 2015-05-22: 10 mg via ORAL
  Filled 2015-05-22 (×3): qty 1

## 2015-05-22 MED ORDER — SODIUM CHLORIDE 0.9 % IR SOLN
Status: AC
  Filled 2015-05-22: qty 1

## 2015-05-22 MED ORDER — HYDROCODONE-ACETAMINOPHEN 5-325 MG PO TABS: 1.0000 | ORAL_TABLET | ORAL | Status: DC | PRN

## 2015-05-22 MED ORDER — FENTANYL CITRATE (PF) 250 MCG/5ML IJ SOLN
INTRAMUSCULAR | Status: AC
  Filled 2015-05-22: qty 5

## 2015-05-22 MED ORDER — SUCCINYLCHOLINE CHLORIDE 20 MG/ML IJ SOLN
INTRAMUSCULAR | Status: DC | PRN
  Administered 2015-05-22: 100 mg via INTRAVENOUS

## 2015-05-22 MED ORDER — FLEET ENEMA 7-19 GM/118ML RE ENEM: 1.0000 | ENEMA | Freq: Once | RECTAL | Status: DC | PRN

## 2015-05-22 MED ORDER — LIDOCAINE HCL (CARDIAC) 20 MG/ML IV SOLN
INTRAVENOUS | Status: DC | PRN
  Administered 2015-05-22: 60 mg via INTRAVENOUS

## 2015-05-22 MED ORDER — BUPIVACAINE LIPOSOME 1.3 % IJ SUSP
20.0000 mL | Freq: Once | INTRAMUSCULAR | Status: AC
  Administered 2015-05-22: 20 mL
  Filled 2015-05-22: qty 20

## 2015-05-22 MED ORDER — ACETAMINOPHEN 10 MG/ML IV SOLN
1000.0000 mg | Freq: Once | INTRAVENOUS | Status: AC
  Administered 2015-05-22: 1000 mg via INTRAVENOUS

## 2015-05-22 MED ORDER — DEXAMETHASONE SODIUM PHOSPHATE 10 MG/ML IJ SOLN
INTRAMUSCULAR | Status: AC
  Filled 2015-05-22: qty 1

## 2015-05-22 MED ORDER — LACTATED RINGERS IV SOLN
INTRAVENOUS | Status: DC
  Administered 2015-05-22 (×2): via INTRAVENOUS

## 2015-05-22 MED ORDER — HYDROMORPHONE HCL 1 MG/ML IJ SOLN
0.5000 mg | INTRAMUSCULAR | Status: DC | PRN
  Administered 2015-05-22 – 2015-05-23 (×3): 1 mg via INTRAVENOUS
  Filled 2015-05-22 (×3): qty 1

## 2015-05-22 MED ORDER — CEFAZOLIN SODIUM-DEXTROSE 2-3 GM-% IV SOLR
2.0000 g | INTRAVENOUS | Status: AC
  Administered 2015-05-22: 2 g via INTRAVENOUS

## 2015-05-22 MED ORDER — THROMBIN 5000 UNITS EX SOLR
CUTANEOUS | Status: AC
  Filled 2015-05-22: qty 10000

## 2015-05-22 MED ORDER — PHENYLEPHRINE HCL 10 MG/ML IJ SOLN
INTRAMUSCULAR | Status: DC | PRN
  Administered 2015-05-22: 40 ug via INTRAVENOUS
  Administered 2015-05-22: 80 ug via INTRAVENOUS
  Administered 2015-05-22 (×2): 40 ug via INTRAVENOUS
  Administered 2015-05-22: 80 ug via INTRAVENOUS
  Administered 2015-05-22: 40 ug via INTRAVENOUS

## 2015-05-22 MED ORDER — MIDAZOLAM HCL 5 MG/5ML IJ SOLN
INTRAMUSCULAR | Status: DC | PRN
  Administered 2015-05-22: 2 mg via INTRAVENOUS

## 2015-05-22 MED ORDER — ROCURONIUM BROMIDE 100 MG/10ML IV SOLN
INTRAVENOUS | Status: AC
  Filled 2015-05-22: qty 1

## 2015-05-22 MED ORDER — DEXTROSE 5 % IV SOLN
500.0000 mg | Freq: Four times a day (QID) | INTRAVENOUS | Status: DC | PRN
  Administered 2015-05-22: 500 mg via INTRAVENOUS
  Filled 2015-05-22 (×2): qty 5

## 2015-05-22 MED ORDER — ACETAMINOPHEN 650 MG RE SUPP: 650.0000 mg | RECTAL | Status: DC | PRN

## 2015-05-22 SURGICAL SUPPLY — 43 items
CLEANER TIP ELECTROSURG 2X2 (MISCELLANEOUS) ×2 IMPLANT
DRAPE MICROSCOPE LEICA (MISCELLANEOUS) ×2 IMPLANT
DRAPE POUCH INSTRU U-SHP 10X18 (DRAPES) ×2 IMPLANT
DRAPE SHEET LG 3/4 BI-LAMINATE (DRAPES) ×2 IMPLANT
DRAPE SURG 17X11 SM STRL (DRAPES) ×2 IMPLANT
DRSG ADAPTIC 3X8 NADH LF (GAUZE/BANDAGES/DRESSINGS) ×2 IMPLANT
DRSG PAD ABDOMINAL 8X10 ST (GAUZE/BANDAGES/DRESSINGS) ×5 IMPLANT
DURAPREP 26ML APPLICATOR (WOUND CARE) ×2 IMPLANT
ELECT BLADE TIP CTD 4 INCH (ELECTRODE) ×2 IMPLANT
ELECT REM PT RETURN 9FT ADLT (ELECTROSURGICAL) ×2
ELECTRODE REM PT RTRN 9FT ADLT (ELECTROSURGICAL) ×1 IMPLANT
GAUZE SPONGE 4X4 12PLY STRL (GAUZE/BANDAGES/DRESSINGS) ×2 IMPLANT
GLOVE BIOGEL PI IND STRL 6.5 (GLOVE) IMPLANT
GLOVE BIOGEL PI IND STRL 7.5 (GLOVE) IMPLANT
GLOVE BIOGEL PI IND STRL 8 (GLOVE) ×1 IMPLANT
GLOVE BIOGEL PI INDICATOR 6.5 (GLOVE) ×2
GLOVE BIOGEL PI INDICATOR 7.5 (GLOVE) ×1
GLOVE BIOGEL PI INDICATOR 8 (GLOVE) ×1
GLOVE ECLIPSE 8.0 STRL XLNG CF (GLOVE) ×3 IMPLANT
GLOVE SURG SS PI 7.5 STRL IVOR (GLOVE) ×1 IMPLANT
GOWN STRL REUS W/ TWL LRG LVL3 (GOWN DISPOSABLE) IMPLANT
GOWN STRL REUS W/TWL LRG LVL3 (GOWN DISPOSABLE) ×2
GOWN STRL REUS W/TWL XL LVL3 (GOWN DISPOSABLE) ×5 IMPLANT
KIT BASIN OR (CUSTOM PROCEDURE TRAY) ×2 IMPLANT
KIT POSITIONING SURG ANDREWS (MISCELLANEOUS) ×1 IMPLANT
MANIFOLD NEPTUNE II (INSTRUMENTS) ×2 IMPLANT
MARKER SKIN DUAL TIP RULER LAB (MISCELLANEOUS) ×2 IMPLANT
NEEDLE HYPO 22GX1.5 SAFETY (NEEDLE) ×2 IMPLANT
NEEDLE SPNL 18GX3.5 QUINCKE PK (NEEDLE) ×6 IMPLANT
PACK LAMINECTOMY ORTHO (CUSTOM PROCEDURE TRAY) ×2 IMPLANT
PATTIES SURGICAL .5 X.5 (GAUZE/BANDAGES/DRESSINGS) ×1 IMPLANT
PATTIES SURGICAL .75X.75 (GAUZE/BANDAGES/DRESSINGS) ×2 IMPLANT
PATTIES SURGICAL 1X1 (DISPOSABLE) ×2 IMPLANT
SPONGE LAP 4X18 X RAY DECT (DISPOSABLE) ×4 IMPLANT
SPONGE SURGIFOAM ABS GEL 100 (HEMOSTASIS) ×2 IMPLANT
STAPLER VISISTAT 35W (STAPLE) ×2 IMPLANT
SUT VIC AB 0 CT1 27 (SUTURE) ×2
SUT VIC AB 0 CT1 27XBRD ANTBC (SUTURE) ×1 IMPLANT
SUT VIC AB 1 CT1 27 (SUTURE) ×6
SUT VIC AB 1 CT1 27XBRD ANTBC (SUTURE) ×3 IMPLANT
SYR 20CC LL (SYRINGE) ×4 IMPLANT
TAPE CLOTH SURG 4X10 WHT LF (GAUZE/BANDAGES/DRESSINGS) ×1 IMPLANT
TOWEL OR 17X26 10 PK STRL BLUE (TOWEL DISPOSABLE) ×2 IMPLANT

## 2015-05-22 NOTE — Anesthesia Procedure Notes (Addendum)
Procedure Name: Intubation Date/Time: 05/22/2015 9:17 AM Performed by: Freddie Breech Pre-anesthesia Checklist: Patient identified, Emergency Drugs available, Suction available, Patient being monitored and Timeout performed Patient Re-evaluated:Patient Re-evaluated prior to inductionOxygen Delivery Method: Circle system utilized Preoxygenation: Pre-oxygenation with 100% oxygen Intubation Type: IV induction Ventilation: Mask ventilation with difficulty and Oral airway inserted - appropriate to patient size Laryngoscope Size: Mac and 3 Grade View: Grade II Tube type: Oral Tube size: 7.5 mm Number of attempts: 1 Airway Equipment and Method: Patient positioned with wedge pillow and Stylet Placement Confirmation: ETT inserted through vocal cords under direct vision,  positive ETCO2,  CO2 detector and breath sounds checked- equal and bilateral Secured at: 22 cm Tube secured with: Tape Dental Injury: Teeth and Oropharynx as per pre-operative assessment

## 2015-05-22 NOTE — Op Note (Signed)
NAMERULA, VRANICH              ACCOUNT NO.:  [id number]  MEDICAL RECORD NO.:  [id number]  LOCATION:  [facility]                         FACILITY:  [facility]  PHYSICIAN:  Kipp Brood. Kenderick Kobler, M.D.DATE OF BIRTH:  December 14, 1950  DATE OF PROCEDURE:  05/22/2015 DATE OF DISCHARGE:                              OPERATIVE REPORT   SURGEON:  Kipp Brood. Gladstone Lighter, M.D.  ASSISTANT:  Susa Day, M.D.  PREOPERATIVE DIAGNOSES: 1. Severe spinal stenosis with a complete block at L3-L4. 2. Partial block at L4-L5 spinal stenosis. 3. Herniated lumbar disk at L3-4 on the left. 4. Foraminal stenosis of the L4 root on the left. 5. Foraminal stenosis of the L5 root on the left.  POSTOPERATIVE DIAGNOSES: 1. Severe spinal stenosis with a complete block at L3-L4. 2. Partial block at L4-L5 spinal stenosis. 3. Herniated lumbar disk at L3-4 on the left. 4. Foraminal stenosis of the L4 root on the left. 5. Foraminal stenosis of the L5 root on the left.  OPERATION: 1. Central decompressive lumbar laminectomy at L3-4. 2. Central lumbar laminectomy for spinal stenosis at L4-5. 3. Foraminotomy of the L4 root on the left. 4. Foraminotomy for the L5 root on the left. 5. Microdiskectomy for herniated disk at L3-4 on the left.  Note, she     had a partial footdrop on the left.  DESCRIPTION OF PROCEDURE:  Under general anesthesia, routine orthopedic prep and drape was carried out with the patient on spinal frame.  The appropriate time-out was first carried out.  I marked the left side of the back as that is where both of her symptoms and her footdrop was.  At this time after the time-out and prep, 2 needles were placed in the back for localization purposes.  X-ray was taken.  At this time, an incision was made over L3-4 and L4-5 space.  Bleeders were identified and cauterized.  At this time, the muscle was stripped from the lamina and spinous process bilaterally.  Another x-ray was taken with 2 Kocher clamps in place.   We then went on and did a central decompressive lumbar laminectomy at L3-4 and L4-5.  Note, there was extreme tightness and ligamentum thickness at L3-4, mainly central and to the left.  We brought the microscope in and carefully did a decompression in the usual fashion.  We protected the dura at all times and then removed the ligamentum flavum.  We then went out and did foraminotomies for the L4 root and the L5 root on the left.  We were able to identify the disk, small cruciate incision was made in the disk and microdiskectomy was carried out.  The area was thoroughly irrigated.  Note, we checked the foramina with the hockey-stick and they were wide open now.  We went up with a hockey-stick proximally and distally and the decompression now was complete.  We were able to easily pass the hockey-stick in both directions.  At this time, we thoroughly irrigated the wound, no specimen was sent.  At the beginning of the case, I injected 25 mL of 0.5% Marcaine with epinephrine into the soft tissue.  At the end of the case, I injected 20 mL of Exparel into the  soft tissue.  The thrombin- soaked Gelfoam was placed over the dura loosely, I then closed the muscle and fascia with #1 Vicryl.  I left a small distal and deep part of the wound open for drainage purposes.  I injected 20 mL of Exparel into the soft tissue at this time.  I then closed the subcu with #1 Vicryl.  Skin was closed with metal staples and sterile Neosporin dressing was applied.  Prior to surgery, she had 2 g of IV Ancef.          ______________________________ Kipp Brood. Gladstone Lighter, M.D.     RAG/MEDQ  D:  05/22/2015  T:  05/22/2015  Job:  [id number]

## 2015-05-22 NOTE — Anesthesia Postprocedure Evaluation (Signed)
Anesthesia Post Note  Patient: Danielle Gray  Procedure(s) Performed: Procedure(s) (LRB): CENTRAL DECOMPRESSION LUMBAR LAMINECTOMY L3-4 AND L4-5, 2 LEVEL SPINAL STENOSIS WITH MICRODISCECTOMY L3-4 ON LEFT AND FORAMINOTOMY L3-4 AND L4-5 LEFT (Left)  Patient location during evaluation: PACU Anesthesia Type: General Level of consciousness: awake and alert Pain management: pain level controlled Vital Signs Assessment: post-procedure vital signs reviewed and stable Respiratory status: spontaneous breathing, nonlabored ventilation, respiratory function stable and patient connected to nasal cannula oxygen Cardiovascular status: blood pressure returned to baseline and stable Postop Assessment: no signs of nausea or vomiting Anesthetic complications: no    Last Vitals:  Filed Vitals:   05/22/15 1130 05/22/15 1145  BP: 135/60 122/57  Pulse: 80 67  Temp:    Resp: 18 20    Last Pain:  Filed Vitals:   05/22/15 1148  PainSc: 5                  Ascencion Coye L

## 2015-05-22 NOTE — Transfer of Care (Signed)
Immediate Anesthesia Transfer of Care Note  Patient: Danielle Gray  Procedure(s) Performed: Procedure(s): CENTRAL DECOMPRESSION LUMBAR LAMINECTOMY L3-4 AND L4-5, 2 LEVEL SPINAL STENOSIS WITH MICRODISCECTOMY L3-4 ON LEFT AND FORAMINOTOMY L3-4 AND L4-5 LEFT (Left)  Patient Location: PACU  Anesthesia Type:General  Level of Consciousness:  sedated, patient cooperative and responds to stimulation  Airway & Oxygen Therapy:Patient Spontanous Breathing and Patient connected to face mask oxgen  Post-op Assessment:  Report given to PACU RN and Post -op Vital signs reviewed and stable  Post vital signs:  Reviewed and stable  Last Vitals:  Filed Vitals:   05/22/15 0640 05/22/15 1045  BP: 126/53 142/64  Pulse: 79 80  Temp: 36.5 C 36.4 C  Resp: 18 14    Complications: No apparent anesthesia complications

## 2015-05-22 NOTE — Brief Op Note (Signed)
05/22/2015  10:40 AM  PATIENT:  Danielle Gray  65 y.o. female  PRE-OPERATIVE DIAGNOSIS:  SPINAL STENOSIS.HNP L3-4 and L-4-L-5.Foraminal Stenosis at BOTH Levels on the Left.Herniated Lumbar Disc at L-3-L-4 on the Left.  POST-OPERATIVE DIAGNOSIS:  Same as Pre-Op  PROCEDURE:  Procedure(s): CENTRAL DECOMPRESSION LUMBAR LAMINECTOMY L3-4 AND L4-5, 2 LEVEL SPINAL STENOSIS WITH MICRODISCECTOMY L3-4 ON LEFT AND FORAMINOTOMY L3-4 AND L4-5 LEFT (Left)  SURGEON:  Surgeon(s) and Role:    * Susa Day, MD - Assisting    * Latanya Maudlin, MD - Primary    ASSISTANTS: Susa Day MD   ANESTHESIA:   general  EBL:  Total I/O In: 1000 [I.V.:1000] Out: 200 [Blood:200]  BLOOD ADMINISTERED:none  DRAINS: none   LOCAL MEDICATIONS USED:  MARCAINE 20cc of 0.25% with Epinephrine at the start of the case and 20cc of Exparel mixed with 20cc of Normal Saline at the end of the Case.    SPECIMEN:  No Specimen  DISPOSITION OF SPECIMEN:  N/A  COUNTS:  YES  TOURNIQUET:  * No tourniquets in log *  DICTATION: .Other Dictation: Dictation Number [id number]  PLAN OF CARE: Admit for overnight observation  PATIENT DISPOSITION:  PACU - hemodynamically stable.   Delay start of Pharmacological VTE agent (>24hrs) due to surgical blood loss or risk of bleeding: yes

## 2015-05-22 NOTE — Interval H&P Note (Signed)
History and Physical Interval Note:  05/22/2015 8:14 AM  Danielle Gray  has presented today for surgery, with the diagnosis of SPINAL STENOSIS.HNP L3-4  The various methods of treatment have been discussed with the patient and family. After consideration of risks, benefits and other options for treatment, the patient has consented to  Procedure(s): CENTRAL DECOMPRESSION LUMBAR LAMINECTOMY L3-4 WITH MICRODISCECTOMY L3-4 LEFT (Left) as a surgical intervention .  The patient's history has been reviewed, patient examined, no change in status, stable for surgery.  I have reviewed the patient's chart and labs.  Questions were answered to the patient's satisfaction.     Vallorie Niccoli A

## 2015-05-22 NOTE — H&P (Signed)
Danielle Gray is an 65 y.o. female.   Chief Complaint: Pain and weakness in both Legs. HPI: Progressive pain in both Legs.  Past Medical History  Diagnosis Date  . Hypertension   . Arthritis   . Lumbar herniated disc   . GERD (gastroesophageal reflux disease)   . 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 Back pain     Past Surgical History  Procedure Laterality Date  . Joint replacement  2006    RT TOTAL KNEE  . Bladder tack  25 YRS AGO    History reviewed. No pertinent family history. Social History:  reports that she has never smoked. She does not have any smokeless tobacco history on file. She reports that she drinks alcohol. She reports that she does not use illicit drugs.  Allergies: No Known Allergies  Medications Prior to Admission  Medication Sig Dispense Refill  . lisinopril-hydrochlorothiazide (PRINZIDE,ZESTORETIC) 10-12.5 MG tablet Take 1 tablet by mouth every morning.    Marland Kitchen oxyCODONE-acetaminophen (PERCOCET) 10-325 MG tablet Take 1 tablet by mouth every 4 (four) hours as needed for pain.    Marland Kitchen acetaminophen (TYLENOL) 500 MG tablet Take 1,000 mg by mouth every 6 (six) hours as needed for moderate pain.    Marland Kitchen FLUCELVAX QUADRIVALENT 0.5 ML SUSY TO BE ADMINISTERED BY PHARMACIST FOR IMMUNIZATION  0  . predniSONE (STERAPRED UNI-PAK 21 TAB) 10 MG (21) TBPK tablet       No results found for this or any previous visit (from the past 48 hour(s)). No results found.  Review of Systems  Constitutional: Negative.   HENT: Negative.   Eyes: Negative.   Respiratory: Negative.   Cardiovascular: Negative.   Gastrointestinal: Negative.   Genitourinary: Negative.   Musculoskeletal: Positive for back pain and joint pain.  Skin: Negative.   Neurological: Positive for focal weakness.  Endo/Heme/Allergies: Negative.   Psychiatric/Behavioral: Negative.     Blood pressure 126/53, pulse 79, temperature 97.7 F (36.5 C), temperature source Oral, resp. rate 18, height 5\' 5"   (1.651 m), weight 98.431 kg (217 lb), SpO2 98 %. Physical Exam  Constitutional: She appears well-developed.  HENT:  Head: Normocephalic.  Eyes: Pupils are equal, round, and reactive to light.  Neck: Normal range of motion.  Cardiovascular: Normal rate.   Respiratory: Effort normal.  GI: Soft.  Musculoskeletal:  Weakness of Dorsiflexors of Left foot.  Neurological:  Weakness of dorsiflexors of Left foot.  Skin: Skin is warm.  Psychiatric: She has a normal mood and affect.     Assessment/Plan Decompressive Lumbar Laminectomies at L-3-L-4 and L-4-L-5 and Microdiscectomy at L-3-L-4 on the Left.  Danton Palmateer A 05/22/2015, 8:09 AM

## 2015-05-23 DIAGNOSIS — M4806 Spinal stenosis, lumbar region: Secondary | ICD-10-CM | POA: Diagnosis not present

## 2015-05-23 MED ORDER — ASPIRIN 325 MG PO TABS: 325.0000 mg | ORAL_TABLET | Freq: Every day | ORAL | Status: DC

## 2015-05-23 MED ORDER — OXYCODONE-ACETAMINOPHEN 5-325 MG PO TABS: 1.0000 | ORAL_TABLET | ORAL | Status: DC | PRN

## 2015-05-23 MED ORDER — METHOCARBAMOL 500 MG PO TABS: 500.0000 mg | ORAL_TABLET | Freq: Four times a day (QID) | ORAL | Status: DC | PRN

## 2015-05-23 NOTE — Progress Notes (Signed)
Subjective: 1 Day Post-Op Procedure(s) (LRB): CENTRAL DECOMPRESSION LUMBAR LAMINECTOMY L3-4 AND L4-5, 2 LEVEL SPINAL STENOSIS WITH MICRODISCECTOMY L3-4 ON LEFT AND FORAMINOTOMY L3-4 AND L4-5 LEFT (Left) Patient reports pain as 1 on 0-10 scale. Doing very well. Has voided and no further leg pain Will DC   Objective: Vital signs in last 24 hours: Temp:  [97.6 F (36.4 C)-100.1 F (37.8 C)] 98 F (36.7 C) (02/02 0530) Pulse Rate:  [63-90] 76 (02/02 0530) Resp:  [14-23] 16 (02/02 0530) BP: (112-142)/(48-66) 131/61 mmHg (02/02 0530) SpO2:  [93 %-100 %] 96 % (02/02 0530) Weight:  [98.431 kg (217 lb)] 98.431 kg (217 lb) (02/01 1231)  Intake/Output from previous day: 02/01 0701 - 02/02 0700 In: 5593.3 [P.O.:1560; I.V.:3783.3; IV Piggyback:250] Out: 1150 [Urine:950; Blood:200] Intake/Output this shift:    No results for input(s): HGB in the last 72 hours. No results for input(s): WBC, RBC, HCT, PLT in the last 72 hours. No results for input(s): NA, K, CL, CO2, BUN, CREATININE, GLUCOSE, CALCIUM in the last 72 hours. No results for input(s): LABPT, INR in the last 72 hours.  Neurologically intact  Assessment/Plan: 1 Day Post-Op Procedure(s) (LRB): CENTRAL DECOMPRESSION LUMBAR LAMINECTOMY L3-4 AND L4-5, 2 LEVEL SPINAL STENOSIS WITH MICRODISCECTOMY L3-4 ON LEFT AND FORAMINOTOMY L3-4 AND L4-5 LEFT (Left) Up with therapy Discharge home with home health  Devarius Nelles A 05/23/2015, 7:09 AM

## 2015-05-23 NOTE — Care Management Note (Signed)
Case Management Note  Patient Details  Name: MAGGIEMAE DIGIOVANNA MRN: [id number] Date of Birth: 1950-05-01  Subjective/Objective:                  CENTRAL DECOMPRESSION LUMBAR LAMINECTOMY L3-4 AND L4-5, 2 LEVEL SPINAL STENOSIS WITH MICRODISCECTOMY L3-4 ON LEFT AND FORAMINOTOMY L3-4 AND L4-5 LEFT (Left) Action/Plan: Discharge planning Expected Discharge Date:  05/23/15               Expected Discharge Plan:  Home/Self Care  In-House Referral:     Discharge planning Services  CM Consult  Post Acute Care Choice:    Choice offered to:     DME Arranged:  N/A DME Agency:  NA  HH Arranged:  NA HH Agency:     Status of Service:  Completed, signed off  Medicare Important Message Given:    Date Medicare IM Given:    Medicare IM give by:    Date Additional Medicare IM Given:    Additional Medicare Important Message give by:     If discussed at [place] of Stay Meetings, dates discussed:    Additional Comments: Pt states no DME as pt is borrowing both a rolling wakler and 3n1.  NO PT/OT HH services recc or ordered.  No other CM needs were communicated. Dellie Catholic, RN 05/23/2015, 1:04 PM

## 2015-05-23 NOTE — Evaluation (Signed)
Physical Therapy Evaluation Patient Details Name: Danielle Gray MRN: [id number] DOB: 1951-01-01 Today's Date: 05/23/2015   History of Present Illness  s/p CENTRAL DECOMPRESSION LUMBAR LAMINECTOMY L3-4 AND L4-5, 2 LEVEL SPINAL STENOSIS WITH MICRODISCECTOMY L3-4 ON LEFT AND FORAMINOTOMY L3-4 AND L4-5 LEFT (Left)  Clinical Impression  Pt admitted with above diagnosis. Pt currently with functional limitations due to the deficits listed below (see PT Problem List).  Pt will benefit from skilled PT to increase their independence and safety with mobility to allow discharge to the venue listed below.      Follow Up Recommendations No PT follow up;Supervision for mobility/OOB    Equipment Recommendations  None recommended by PT    Recommendations for Other Services       Precautions / Restrictions Precautions Precautions: Back Precaution Comments: reviewed back precautions with patient via handout Restrictions Weight Bearing Restrictions: No      Mobility  Bed Mobility Overal bed mobility: Needs Assistance Bed Mobility: Sidelying to Sit;Sit to Sidelying Rolling: Min guard Sidelying to sit: Min guard     Sit to sidelying: Min assist General bed mobility comments: cues for technique to maintain back precautions, incr time, assist to bring LEs onto bed  Transfers Overall transfer level: Needs assistance Equipment used: Rolling walker (2 wheeled) Transfers: Sit to/from Stand Sit to Stand: Min guard         General transfer comment: cues for hand placement, incr time  Ambulation/Gait Ambulation/Gait assistance: Min assist Ambulation Distance (Feet): 5 Feet (forward and back) Assistive device: Rolling walker (2 wheeled) Gait Pattern/deviations: Step-to pattern;Decreased stride length;Shuffle     General Gait Details: cues for step length and posture; pt too painful to continue and had to return to bed   Stairs            Wheelchair Mobility    Modified Rankin  (Stroke Patients Only)       Balance Overall balance assessment: Needs assistance           Standing balance-Leahy Scale: Fair                               Pertinent Vitals/Pain Pain Assessment: 0-10 Pain Score: 10-Worst pain ever Pain Location: hips and back Pain Descriptors / Indicators: Cramping Pain Intervention(s): Limited activity within patient's tolerance;Premedicated before session;Monitored during session;Repositioned    Home Living Family/patient expects to be discharged to:: Private residence Living Arrangements: Spouse/significant other Available Help at Discharge: Family;Available PRN/intermittently Type of Home: House         Home Equipment: None Additional Comments: can borrow RW, [facility]    Prior Function Level of Independence: Independent               Hand Dominance   Dominant Hand: Right    Extremity/Trunk Assessment   Upper Extremity Assessment: Overall WFL for tasks assessed;Defer to OT evaluation           Lower Extremity Assessment: Overall WFL for tasks assessed;RLE deficits/detail;LLE deficits/detail         Communication   Communication: No difficulties  Cognition Arousal/Alertness: Awake/alert Behavior During Therapy: WFL for tasks assessed/performed Overall Cognitive Status: Within Functional Limits for tasks assessed                      General Comments      Exercises        Assessment/Plan    PT Assessment Patient needs continued  PT services  PT Diagnosis Difficulty walking   PT Problem List Decreased strength;Decreased range of motion;Decreased activity tolerance;Decreased mobility  PT Treatment Interventions DME instruction;Gait training;Stair training;Functional mobility training;Therapeutic activities;Therapeutic exercise;Balance training;Patient/family education   PT Goals (Current goals can be found in the Care Plan section) Acute Rehab PT Goals Patient Stated Goal: decreased  pain PT Goal Formulation: With patient Time For Goal Achievement: 05/30/15 Potential to Achieve Goals: Good    Frequency 7X/week   Barriers to discharge        Co-evaluation               End of Session Equipment Utilized During Treatment: Gait belt Activity Tolerance: Patient tolerated treatment well Patient left: with call bell/phone within reach;in bed;with family/visitor present      Functional Assessment Tool Used: clinical judgement Functional Limitation: Mobility: Walking and moving around Mobility: Walking and Moving Around Current Status [id number]): At least 1 percent but less than 20 percent impaired, limited or restricted Mobility: Walking and Moving Around Goal Status [phone]): At least 1 percent but less than 20 percent impaired, limited or restricted    Time: 0918-0933 PT Time Calculation (min) (ACUTE ONLY): 15 min   Charges:   PT Evaluation $PT Eval Low Complexity: 1 Procedure     PT G Codes:   PT G-Codes **NOT FOR INPATIENT CLASS** Functional Assessment Tool Used: clinical judgement Functional Limitation: Mobility: Walking and moving around Mobility: Walking and Moving Around Current Status [id number]): At least 1 percent but less than 20 percent impaired, limited or restricted Mobility: Walking and Moving Around Goal Status [phone]): At least 1 percent but less than 20 percent impaired, limited or restricted    [facility] 05/23/2015, 12:44 PM

## 2015-05-23 NOTE — Discharge Instructions (Signed)
For the first few days, remove your dressing, tape a piece of saran wrap over your incision.  Take your shower, then remove the saran wrap and put a clean dressing on. After two days you can shower without the saran wrap.  Call Dr. Gladstone Lighter if any wound complications or temperature of 101 degrees F or over.  Call the office for an appointment to see Dr. Gladstone Lighter in two weeks: [phone] and ask for Dr. Charlestine Night nurse, Brunilda Payor.  Change your dressing daily. Shower only, no tub bath. Call if any temperatures greater than 101 or any wound complications: [phone] during the day and ask for Dr. Charlestine Night nurse, Brunilda Payor.

## 2015-05-23 NOTE — Progress Notes (Signed)
PT Cancellation Note  Patient Details Name: Danielle Gray MRN: [id number] DOB: 09-Aug-1950   Cancelled Treatment:    Reason Eval/Treat Not Completed: Pain limiting ability to participate; second attempt to see pt since limited eval earlier this am--pt in bed crying and states she can't get up d/t muscle spasms;  Pt has been up to bathroom repeatedly with NT but has been unable to take more than 2 steps with PT or OT today; will attempt again next day or as schedule permits; RN is aware   [facility] 05/23/2015, 3:12 PM

## 2015-05-23 NOTE — Progress Notes (Signed)
Physical Therapy Treatment Patient Details Name: Danielle Gray MRN: [id number] DOB: [birth year] Today's Date: 05/23/2015    History of Present Illness s/p CENTRAL DECOMPRESSION LUMBAR LAMINECTOMY L3-4 AND L4-5, 2 LEVEL SPINAL STENOSIS WITH MICRODISCECTOMY L3-4 ON LEFT AND FORAMINOTOMY L3-4 AND L4-5 LEFT (Left)    PT Comments    Pain improved, pt still very guarded but able to amb short distance and practice stairs this pm; will see in am prior to D/C   Follow Up Recommendations  No PT follow up;Supervision for mobility/OOB     Equipment Recommendations  None recommended by PT    Recommendations for Other Services       Precautions / Restrictions Precautions Precautions: Back Precaution Comments: reviewed back precautions Restrictions Weight Bearing Restrictions: No    Mobility  Bed Mobility Overal bed mobility: Needs Assistance Bed Mobility: Rolling;Sidelying to Sit Rolling: Supervision Sidelying to sit: Supervision     Sit to sidelying: Min assist General bed mobility comments: cues for technique to maintain back precautions, incr time, assist to bring LEs onto bed  Transfers Overall transfer level: Needs assistance Equipment used: Rolling walker (2 wheeled) Transfers: Sit to/from Stand Sit to Stand: Supervision         General transfer comment: cues for hand placement, incr time  Ambulation/Gait Ambulation/Gait assistance: Min guard Ambulation Distance (Feet): 30 Feet Assistive device: Rolling walker (2 wheeled) Gait Pattern/deviations: Step-to pattern;Step-through pattern;Decreased stride length     General Gait Details: cues for posture, step length, RW distance from self   Stairs Stairs: Yes Stairs assistance: Min assist Stair Management: One rail Left;One rail Right;Step to pattern;Forwards (and HHA) Number of Stairs: 5 General stair comments: cues for sequence  Wheelchair Mobility    Modified Rankin (Stroke Patients Only)        Balance Overall balance assessment: Needs assistance           Standing balance-Leahy Scale: Fair                      Cognition Arousal/Alertness: Awake/alert Behavior During Therapy: WFL for tasks assessed/performed Overall Cognitive Status: Within Functional Limits for tasks assessed                      Exercises      General Comments        Pertinent Vitals/Pain Pain Assessment: 0-10 Pain Score: 4  Pain Location: hips Pain Descriptors / Indicators: Cramping Pain Intervention(s): Limited activity within patient's tolerance;Monitored during session;Premedicated before session;Repositioned    Home Living Family/patient expects to be discharged to:: Private residence Living Arrangements: Spouse/significant other Available Help at Discharge: Family;Available PRN/intermittently Type of Home: House       Home Equipment: None Additional Comments: can borrow RW, [facility]    Prior Function Level of Independence: Independent          PT Goals (current goals can now be found in the care plan section) Acute Rehab PT Goals Patient Stated Goal: decreased pain PT Goal Formulation: With patient Time For Goal Achievement: 05/30/15 Potential to Achieve Goals: Good Progress towards PT goals: Progressing toward goals    Frequency  7X/week    PT Plan Current plan remains appropriate    Co-evaluation             End of Session Equipment Utilized During Treatment: Gait belt Activity Tolerance: Patient tolerated treatment well Patient left: in chair;with call bell/phone within reach;with family/visitor present     Time: [id number] PT Time  Calculation (min) (ACUTE ONLY): 18 min  Charges:  $Gait Training: 8-22 mins                    G Codes:  Functional Assessment Tool Used: clinical judgement Functional Limitation: Mobility: Walking and moving around Mobility: Walking and Moving Around Current Status [phone]): At least 1 percent but less than 20  percent impaired, limited or restricted Mobility: Walking and Moving Around Goal Status [phone]): At least 1 percent but less than 20 percent impaired, limited or restricted   [facility] 05/23/2015, 4:14 PM

## 2015-05-23 NOTE — Progress Notes (Signed)
Occupational Therapy Evaluation Patient Details Name: Danielle Gray MRN: [id number] DOB: 1950-08-31 Today's Date: 05/23/2015    History of Present Illness s/p CENTRAL DECOMPRESSION LUMBAR LAMINECTOMY L3-4 AND L4-5, 2 LEVEL SPINAL STENOSIS WITH MICRODISCECTOMY L3-4 ON LEFT AND FORAMINOTOMY L3-4 AND L4-5 LEFT (Left)   Clinical Impression   Patient limited by pain this session. Education completed but patient unable to practice toilet and shower transfers. OT will follow.    Follow Up Recommendations  No OT follow up;Supervision/Assistance - 24 hour    Equipment Recommendations  None recommended by OT (pt to borrow RW and BSC)    Recommendations for Other Services PT consult     Precautions / Restrictions Precautions Precautions: Back Precaution Comments: reviewed back precautions with patient via handout Restrictions Weight Bearing Restrictions: No      Mobility Bed Mobility Overal bed mobility: Needs Assistance Bed Mobility: Sidelying to Sit;Sit to Sidelying   Sidelying to sit: Min guard     Sit to sidelying: Min assist General bed mobility comments: cues for technique to maintain back precautions  Transfers Overall transfer level: Needs assistance Equipment used: Rolling walker (2 wheeled) Transfers: Sit to/from Stand Sit to Stand: Min guard         General transfer comment: cues for hand placement    Balance                                            ADL Overall ADL's : Needs assistance/impaired Eating/Feeding: Independent;Bed level   Grooming: Set up;Bed level   Upper Body Bathing: Set up;Sitting   Lower Body Bathing: Moderate assistance;Sit to/from stand   Upper Body Dressing : Set up;Sitting   Lower Body Dressing: Moderate assistance;Sit to/from stand               Functional mobility during ADLs: Min guard;Minimal assistance;Rolling walker General ADL Comments: Patient received in bed, dressed in her own pajamas,  family at bedside. Agreeable to try to get OOB. Reports recently going to the bathroom to toilet using RW and 3 in 1 over toilet, while she toileted she also had family assist her with getting dressed into her own pajamas. Reviewed all back precautions with patient. Bed mobility min guard A supine to sit and min A sit to supine for LE management. Cues for technique. Patient stood to RW with min guard A and was able to take a few steps forward before reporting she could not do any more and proceeded to take backward steps back to bed. Back to sidelying and positioned comfortably. Nurse made aware of patient's pain.     Vision     Perception     Praxis      Pertinent Vitals/Pain Pain Assessment: 0-10 Pain Score: 10-Worst pain ever Pain Location: hips and back Pain Descriptors / Indicators: Cramping;Discomfort;Grimacing;Guarding;Squeezing Pain Intervention(s): Limited activity within patient's tolerance;Monitored during session;Premedicated before session     Hand Dominance Right   Extremity/Trunk Assessment Upper Extremity Assessment Upper Extremity Assessment: Overall WFL for tasks assessed   Lower Extremity Assessment Lower Extremity Assessment: Defer to PT evaluation       Communication Communication Communication: No difficulties   Cognition Arousal/Alertness: Awake/alert Behavior During Therapy: WFL for tasks assessed/performed Overall Cognitive Status: Within Functional Limits for tasks assessed  General Comments       Exercises       Shoulder Instructions      Home Living Family/patient expects to be discharged to:: Private residence Living Arrangements: Spouse/significant other Available Help at Discharge: Family;Available PRN/intermittently Type of Home: House             Bathroom Shower/Tub: Walk-in Psychologist, prison and probation services: Standard Bathroom Accessibility: Yes How Accessible: Accessible via walker Home Equipment: None    Additional Comments: can borrow RW, BSC      Prior Functioning/Environment Level of Independence: Independent             OT Diagnosis: Acute pain   OT Problem List: Decreased activity tolerance;Decreased knowledge of use of DME or AE;Decreased knowledge of precautions;Pain   OT Treatment/Interventions: Self-care/ADL training;DME and/or AE instruction;Therapeutic activities;Patient/family education    OT Goals(Current goals can be found in the care plan section) Acute Rehab OT Goals Patient Stated Goal: decreased pain OT Goal Formulation: With patient Time For Goal Achievement: 06/06/15 Potential to Achieve Goals: Good ADL Goals Pt Will Perform Lower Body Bathing: with supervision;sit to/from stand Pt Will Perform Lower Body Dressing: with min assist;sit to/from stand Pt Will Transfer to Toilet: with supervision;ambulating;bedside commode Pt Will Perform Toileting - Clothing Manipulation and hygiene: with supervision;sit to/from stand Pt Will Perform Tub/Shower Transfer: with supervision;ambulating;shower seat;rolling walker;Shower transfer  OT Frequency: Min 2X/week   Barriers to D/C:            Co-evaluation              End of Session Equipment Utilized During Treatment: Surveyor, mining Communication: Mobility status;Patient requests pain meds  Activity Tolerance: Patient limited by pain Patient left: in bed;with call bell/phone within reach;with family/visitor present   Time: 1157-1210 OT Time Calculation (min): 13 min Charges:  OT General Charges $OT Visit: 1 Procedure OT Evaluation $OT Eval Low Complexity: 1 Procedure G-Codes: OT G-codes **NOT FOR INPATIENT CLASS** Functional Assessment Tool Used: clinical judgment Functional Limitation: Self care Self Care Current Status [id number]): At least 40 percent but less than 60 percent impaired, limited or restricted Self Care Goal Status [id number]): At least 1 percent but less than 20 percent impaired,  limited or restricted  Jaqualyn Juday A 05/23/2015, 12:22 PM

## 2015-05-24 DIAGNOSIS — M4806 Spinal stenosis, lumbar region: Secondary | ICD-10-CM | POA: Diagnosis not present

## 2015-05-24 NOTE — Progress Notes (Signed)
Subjective: 2 Days Post-Op Procedure(s) (LRB): CENTRAL DECOMPRESSION LUMBAR LAMINECTOMY L3-4 AND L4-5, 2 LEVEL SPINAL STENOSIS WITH MICRODISCECTOMY L3-4 ON LEFT AND FORAMINOTOMY L3-4 AND L4-5 LEFT (Left) Patient reports pain as 1 on 0-10 scale.  Much better this morning. She had difficulty ambulating yesterday at noon so we held her DC. She was in severe spasms but now is much better.Will DC  Objective: Vital signs in last 24 hours: Temp:  [98.6 F (37 C)-99.3 F (37.4 C)] 99.3 F (37.4 C) (02/03 0507) Pulse Rate:  [80-93] 88 (02/03 0507) Resp:  [18] 18 (02/03 0507) BP: (109-113)/(38-49) 113/38 mmHg (02/03 0507) SpO2:  [95 %-97 %] 95 % (02/03 0507)  Intake/Output from previous day: 02/02 0701 - 02/03 0700 In: 480 [P.O.:480] Out: 750 [Urine:750] Intake/Output this shift:    No results for input(s): HGB in the last 72 hours. No results for input(s): WBC, RBC, HCT, PLT in the last 72 hours. No results for input(s): NA, K, CL, CO2, BUN, CREATININE, GLUCOSE, CALCIUM in the last 72 hours. No results for input(s): LABPT, INR in the last 72 hours.  Dorsiflexion/Plantar flexion intact  Assessment/Plan: 2 Days Post-Op Procedure(s) (LRB): CENTRAL DECOMPRESSION LUMBAR LAMINECTOMY L3-4 AND L4-5, 2 LEVEL SPINAL STENOSIS WITH MICRODISCECTOMY L3-4 ON LEFT AND FORAMINOTOMY L3-4 AND L4-5 LEFT (Left) Up with therapy Discharge home with home health  Grazia Taffe A 05/24/2015, 7:21 AM

## 2015-05-24 NOTE — Progress Notes (Signed)
Occupational Therapy Treatment Patient Details Name: Danielle Gray MRN: [id number] DOB: 01-18-1951 Today's Date: 05/24/2015    History of present illness s/p CENTRAL DECOMPRESSION LUMBAR LAMINECTOMY L3-4 AND L4-5, 2 LEVEL SPINAL STENOSIS WITH MICRODISCECTOMY L3-4 ON LEFT AND FORAMINOTOMY L3-4 AND L4-5 LEFT (Left)   OT comments  All education completed this session.  No further OT needs  Follow Up Recommendations  No OT follow up;Supervision/Assistance - 24 hour    Equipment Recommendations  None recommended by OT (pt will borrow 3:1)    Recommendations for Other Services      Precautions / Restrictions Precautions Precautions: Back Precaution Comments: reviewed back precautions Restrictions Weight Bearing Restrictions: No       Mobility Bed Mobility       Sidelying to sit: Supervision       General bed mobility comments: pt was in sidelying. utilized Merchandiser, retail used: Conservation officer, nature (2 wheeled)   Sit to Stand: Supervision         General transfer comment: cues for UE placement    Balance                                   ADL                           Toilet Transfer: Supervision/safety;Ambulation;BSC   Toileting- Clothing Manipulation and Hygiene: Minimal assistance;Sit to/from stand   Tub/ Shower Transfer: Min guard;Ambulation     General ADL Comments: practiced bathroom transfers and completed oral care at sink with cues for back precautions. Pt also needed cues to step into walker to keep back straight.  Reviewed toilet aide, but did not have one to show her.  Reviewed back precautions during the context of ADLs. Pt and daughter verbalize understanding of all education      Vision                     Perception     Praxis      Cognition   Behavior During Therapy: WFL for tasks assessed/performed Overall Cognitive Status: Within Functional Limits for tasks assessed                        Extremity/Trunk Assessment               Exercises     Shoulder Instructions       General Comments      Pertinent Vitals/ Pain       Pain Score: 4  Pain Location: back Pain Descriptors / Indicators: Aching Pain Intervention(s): Limited activity within patient's tolerance;Monitored during session;Premedicated before session;Repositioned;Ice applied  Home Living                                          Prior Functioning/Environment              Frequency       Progress Toward Goals  OT Goals(current goals can now be found in the care plan section)  Progress towards OT goals: Progressing toward goals  Acute Rehab OT Goals Patient Stated Goal: decreased pain  Plan      Co-evaluation  End of Session     Activity Tolerance Patient tolerated treatment well   Patient Left in chair;with call bell/phone within reach;with family/visitor present   Nurse Communication          Time: [phone] OT Time Calculation (min): 19 min  Charges: OT General Charges $OT Visit: 1 Procedure OT Treatments $Self Care/Home Management : 8-22 mins  Abriel Geesey 05/24/2015, 9:00 AM Lesle Chris, OTR/L [phone] 05/24/2015

## 2015-05-27 NOTE — Discharge Summary (Signed)
Physician Discharge Summary   Patient ID: Danielle Gray MRN: [id number] DOB/AGE: [birth year] 63 y.o.  Admit date: 05/22/2015 Discharge date: 05/24/2015  Primary Diagnosis: Lumbar spinal stenosis with disc herniation  Admission Diagnoses:  Past Medical History  Diagnosis Date  . Hypertension   . Arthritis   . Lumbar herniated disc   . GERD (gastroesophageal reflux disease)   . 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 Back pain    Discharge Diagnoses:   Active Problems:   Spinal stenosis, lumbar region, with neurogenic claudication  Estimated body mass index is 36.11 kg/(m^2) as calculated from the following:   Height as of this encounter: 5' 5"  (1.651 m).   Weight as of this encounter: 98.431 kg (217 lb).  Procedure:  Procedure(s) (LRB): CENTRAL DECOMPRESSION LUMBAR LAMINECTOMY L3-4 AND L4-5, 2 LEVEL SPINAL STENOSIS WITH MICRODISCECTOMY L3-4 ON LEFT AND FORAMINOTOMY L3-4 AND L4-5 LEFT (Left)   Consults: None  HPI: The patient presented with the chief complaint of low back pain that started to radiate into her legs, left greater than right. She also developed weakness in the left foot. She was not responsive to conservative treatments. MRI showed a disc herniation at L3-L4 and L4-L5.   Laboratory Data: Hospital Outpatient Visit on 05/15/2015  Component Date Value Ref Range Status  . MRSA, PCR 05/15/2015 NEGATIVE  NEGATIVE Final  . Staphylococcus aureus 05/15/2015 POSITIVE* NEGATIVE Final   Comment:        The Xpert SA Assay (FDA approved for NASAL specimens in patients over 63 years of age), is one component of a comprehensive surveillance program.  Test performance has been validated by [facility] for patients greater than or equal to 5 year old. It is not intended to diagnose infection nor to guide or monitor treatment.   Marland Kitchen aPTT 05/15/2015 34  24 - 37 seconds Final  . WBC 05/15/2015 11.1* 4.0 - 10.5 K/uL Final  . RBC 05/15/2015 4.52  3.87 - 5.11 MIL/uL  Final  . Hemoglobin 05/15/2015 13.5  12.0 - 15.0 g/dL Final  . HCT 05/15/2015 40.8  36.0 - 46.0 % Final  . MCV 05/15/2015 90.3  78.0 - 100.0 fL Final  . MCH 05/15/2015 29.9  26.0 - 34.0 pg Final  . MCHC 05/15/2015 33.1  30.0 - 36.0 g/dL Final  . RDW 05/15/2015 12.3  11.5 - 15.5 % Final  . Platelets 05/15/2015 279  150 - 400 K/uL Final  . Neutrophils Relative % 05/15/2015 70   Final  . Neutro Abs 05/15/2015 7.7  1.7 - 7.7 K/uL Final  . Lymphocytes Relative 05/15/2015 22   Final  . Lymphs Abs 05/15/2015 2.4  0.7 - 4.0 K/uL Final  . Monocytes Relative 05/15/2015 6   Final  . Monocytes Absolute 05/15/2015 0.7  0.1 - 1.0 K/uL Final  . Eosinophils Relative 05/15/2015 2   Final  . Eosinophils Absolute 05/15/2015 0.2  0.0 - 0.7 K/uL Final  . Basophils Relative 05/15/2015 0   Final  . Basophils Absolute 05/15/2015 0.0  0.0 - 0.1 K/uL Final  . Sodium 05/15/2015 142  135 - 145 mmol/L Final  . Potassium 05/15/2015 4.0  3.5 - 5.1 mmol/L Final  . Chloride 05/15/2015 104  101 - 111 mmol/L Final  . CO2 05/15/2015 28  22 - 32 mmol/L Final  . Glucose, Bld 05/15/2015 106* 65 - 99 mg/dL Final  . BUN 05/15/2015 18  6 - 20 mg/dL Final  . Creatinine, Ser 05/15/2015 0.80  0.44 - 1.00 mg/dL Final  . Calcium 05/15/2015 9.7  8.9 - 10.3 mg/dL Final  . Total Protein 05/15/2015 6.9  6.5 - 8.1 g/dL Final  . Albumin 05/15/2015 4.0  3.5 - 5.0 g/dL Final  . AST 05/15/2015 21  15 - 41 U/L Final  . ALT 05/15/2015 21  14 - 54 U/L Final  . Alkaline Phosphatase 05/15/2015 78  38 - 126 U/L Final  . Total Bilirubin 05/15/2015 0.3  0.3 - 1.2 mg/dL Final  . GFR calc non Af Amer 05/15/2015 >60  >60 mL/min Final  . GFR calc Af Amer 05/15/2015 >60  >60 mL/min Final   Comment: (NOTE) The eGFR has been calculated using the CKD EPI equation. This calculation has not been validated in all clinical situations. eGFR's persistently <60 mL/min signify possible Chronic Kidney Disease.   . Anion gap 05/15/2015 10  5 - 15 Final  .  Prothrombin Time 05/15/2015 13.9  11.6 - 15.2 seconds Final  . INR 05/15/2015 1.09  0.00 - 1.49 Final     X-Rays:Dg Chest 2 View  05/15/2015  CLINICAL DATA:  Preop lumbar laminectomy. EXAM: CHEST  2 VIEW COMPARISON:  None. FINDINGS: Linear densities in the lingula, right middle lobe, likely scarring. Heart is normal size. No confluent opacities or effusions. No acute bony abnormality. Degenerative spurring anteriorly throughout the thoracic spine. IMPRESSION: No active cardiopulmonary disease. Electronically Signed   By: Rolm Baptise M.D.   On: 05/15/2015 10:34   Dg Lumbar Spine 2-3 Views  05/15/2015  CLINICAL DATA:  Moderate mid to lower back pain radiating down left leg. Preop laminectomy. EXAM: LUMBAR SPINE - 2-3 VIEW COMPARISON:  None. FINDINGS: Diffuse degenerative disc disease and facet disease throughout the lumbar spine, most pronounced at L4-5 and L5-S1. Normal alignment. No fracture. IMPRESSION: Diffuse spondylosis.  No acute bony abnormality. Electronically Signed   By: Rolm Baptise M.D.   On: 05/15/2015 10:35   Dg Spine Portable 1 View  05/22/2015  CLINICAL DATA:  Intraoperative film  #3 EXAM: PORTABLE SPINE - 1 VIEW COMPARISON:  Prior lumbar spine films same day FINDINGS: Single lateral view of the lumbar spine submitted. There is a posterior localization instrument at the level of L3-L4 disc space. IMPRESSION: Posterior localization instrument at the level of L3-L4 disc space. Electronically Signed   By: Lahoma Crocker M.D.   On: 05/22/2015 09:40   Dg Spine Portable 1 View  05/22/2015  CLINICAL DATA:  Intraoperative film for L3-L4 lumbar decompression EXAM: PORTABLE SPINE - 1 VIEW COMPARISON:  Prior film same day FINDINGS: Single lateral view of the lumbar spine submitted. There is a posterior localization instrument at the level of L3-L4 disc space. Second posterior localization instrument at the level of L4 vertebral body about mid aspect of spinous process of L4. IMPRESSION: There is a  posterior localization instrument at the level of L3-L4 disc space. Second posterior localization instrument at the level of L4 vertebral body about mid aspect of spinous process of L4. Electronically Signed   By: Lahoma Crocker M.D.   On: 05/22/2015 09:30   Dg Spine Portable 1 View  05/22/2015  CLINICAL DATA:  Lumbar decompression L3-L4 level. EXAM: PORTABLE SPINE - 1 VIEW COMPARISON:  05/15/2015 FINDINGS: Single lateral view of the lumbar spine submitted. There is minimal about 4 mm anterolisthesis L3 on L4 vertebral body. There is a posterior localization needle at L4 level about superior aspect of spinous process of L4. There is a second posterior localization needle  posteriorly at the level of L5 vertebral body. IMPRESSION: There is minimal about 4 mm anterolisthesis L3 on L4 vertebral body. There is a posterior localization needle at L4 level about superior aspect of spinous process of L4. There is a second posterior localization needle posteriorly at the level of L5 vertebral body. Electronically Signed   By: Lahoma Crocker M.D.   On: 05/22/2015 09:04    EKG: Orders placed or performed during the hospital encounter of 05/15/15  . EKG  . EKG     Hospital Course: Danielle Gray is a 65 y.o. who was admitted to [facility]. They were brought to the operating room on 05/22/2015 and underwent Procedure(s): CENTRAL DECOMPRESSION LUMBAR LAMINECTOMY L3-4 AND L4-5, 2 LEVEL SPINAL STENOSIS WITH MICRODISCECTOMY L3-4 ON LEFT AND FORAMINOTOMY L3-4 AND L4-5 LEFT.  Patient tolerated the procedure well and was later transferred to the recovery room and then to the orthopaedic floor for postoperative care.  They were given PO and IV analgesics for pain control following their surgery.  They were given 24 hours of postoperative antibiotics of  Anti-infectives    Start     Dose/Rate Route Frequency Ordered Stop   05/22/15 1400  ceFAZolin (ANCEF) IVPB 1 g/50 mL premix     1 g 100 mL/hr over 30 Minutes  Intravenous 3 times per day 05/22/15 1241 05/23/15 0541   05/22/15 0859  polymyxin B 500,000 Units, bacitracin 50,000 Units in sodium chloride irrigation 0.9 % 500 mL irrigation  Status:  Discontinued       As needed 05/22/15 0914 05/22/15 1047   05/22/15 0633  ceFAZolin (ANCEF) IVPB 2 g/50 mL premix     2 g 100 mL/hr over 30 Minutes Intravenous On call to O.R. 05/22/15 [id number] 05/22/15 0845     and started on DVT prophylaxis in the form of Aspirin.   PT was ordered to walk with the patient.  Discharge planning consulted to help with postop disposition and equipment needs.  Patient had a fair night on the evening of surgery.  They started to get up OOB with therapy on day one. She progressed slowly with therapy because of her pain.  Continued to work with therapy into day two.  Dressing was changed on day two and the incision was clean and dry.  The patient had progressed with therapy and meeting their goals.  Incision was healing well.  Patient was seen in rounds and was ready to go home.   Diet: Cardiac diet Activity:WBAT Follow-up:in 2 weeks Disposition - Home Discharged Condition: stable   Discharge Instructions    Call MD / Call 911    Complete by:  As directed   If you experience chest pain or shortness of breath, CALL 911 and be transported to the hospital emergency room.  If you develope a fever above 101 F, pus (white drainage) or increased drainage or redness at the wound, or calf pain, call your surgeon's office.     Constipation Prevention    Complete by:  As directed   Drink plenty of fluids.  Prune juice may be helpful.  You may use a stool softener, such as Colace (over the counter) 100 mg twice a day.  Use MiraLax (over the counter) for constipation as needed.     Diet - low sodium heart healthy    Complete by:  As directed      Discharge instructions    Complete by:  As directed   For the first few  days, remove your dressing, tape a piece of saran wrap over your incision.    Take your shower, then remove the saran wrap and put a clean dressing on. After two days you can shower without the saran wrap.  Call Dr. Gladstone Lighter if any wound complications or temperature of 101 degrees F or over.  Call the office for an appointment to see Dr. Gladstone Lighter in two weeks: [phone] and ask for Dr. Charlestine Night nurse, Brunilda Payor.  Change your dressing daily. Shower only, no tub bath. Call if any temperatures greater than 101 or any wound complications: [phone] during the day and ask for Dr. Charlestine Night nurse, Brunilda Payor.     Driving restrictions    Complete by:  As directed   No driving while taking pain medications     Increase activity slowly as tolerated    Complete by:  As directed      Lifting restrictions    Complete by:  As directed   No lifting            Medication List    STOP taking these medications        oxyCODONE-acetaminophen 10-325 MG tablet  Commonly known as:  PERCOCET  Replaced by:  oxyCODONE-acetaminophen 5-325 MG tablet     predniSONE 10 MG (21) Tbpk tablet  Commonly known as:  STERAPRED UNI-PAK 21 TAB      TAKE these medications        acetaminophen 500 MG tablet  Commonly known as:  TYLENOL  Take 1,000 mg by mouth every 6 (six) hours as needed for moderate pain.     aspirin 325 MG tablet  Commonly known as:  BAYER ASPIRIN  Take 1 tablet (325 mg total) by mouth daily. To prevent blood clots     FLUCELVAX QUADRIVALENT 0.5 ML Susy  Generic drug:  Influenza Vac Subunit Quad  TO BE ADMINISTERED BY PHARMACIST FOR IMMUNIZATION     lisinopril-hydrochlorothiazide 10-12.5 MG tablet  Commonly known as:  PRINZIDE,ZESTORETIC  Take 1 tablet by mouth every morning.     methocarbamol 500 MG tablet  Commonly known as:  ROBAXIN  Take 1 tablet (500 mg total) by mouth every 6 (six) hours as needed for muscle spasms.     oxyCODONE-acetaminophen 5-325 MG tablet  Commonly known as:  PERCOCET/ROXICET  Take 1-2 tablets by mouth every 4 (four)  hours as needed for moderate pain.           Follow-up Information    Follow up with GIOFFRE,RONALD A, MD. Schedule an appointment as soon as possible for a visit in 2 weeks.   Specialty:  Orthopedic Surgery   Contact information:   [address] [place] [address] [phone]       Signed: Ardeen Jourdain, PA-C Orthopaedic Surgery 05/27/2015, 11:14 AM

## 2016-02-27 ENCOUNTER — Ambulatory Visit: Payer: Self-pay | Admitting: Orthopedic Surgery

## 2016-03-10 ENCOUNTER — Ambulatory Visit: Payer: Self-pay | Admitting: Orthopedic Surgery

## 2016-03-10 NOTE — H&P (Signed)
TOTAL HIP ADMISSION H&P  Patient is admitted for left total hip arthroplasty.  Subjective:  Chief Complaint: left hip pain  HPI: Danielle Gray, 65 y.o. female, has a history of pain and functional disability in the left hip(s) due to arthritis and patient has failed non-surgical conservative treatments for greater than 12 weeks to include NSAID's and/or analgesics, viscosupplementation injections, use of assistive devices, weight reduction as appropriate and activity modification.  Onset of symptoms was gradual starting 1 years ago with gradually worsening course since that time.The patient noted no past surgery on the left hip(s).  Patient currently rates pain in the left hip at 10 out of 10 with activity. Patient has night pain, worsening of pain with activity and weight bearing, pain that interfers with activities of daily living, pain with passive range of motion and crepitus. Patient has evidence of subchondral cysts, subchondral sclerosis, periarticular osteophytes and joint space narrowing by imaging studies. This condition presents safety issues increasing the risk of falls. There is no current active infection.  Patient Active Problem List   Diagnosis Date Noted  . Spinal stenosis, lumbar region, with neurogenic claudication 05/22/2015   Past Medical History:  Diagnosis Date  . Arthritis   . Back pain   . GERD (gastroesophageal reflux disease)   . 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 Hypertension   . Lumbar herniated disc     Past Surgical History:  Procedure Laterality Date  . BLADDER TACK  25 YRS AGO  . JOINT REPLACEMENT  2006   RT TOTAL KNEE  . LUMBAR LAMINECTOMY/DECOMPRESSION MICRODISCECTOMY Left 05/22/2015   Procedure: CENTRAL DECOMPRESSION LUMBAR LAMINECTOMY L3-4 AND L4-5, 2 LEVEL SPINAL STENOSIS WITH MICRODISCECTOMY L3-4 ON LEFT AND FORAMINOTOMY L3-4 AND L4-5 LEFT;  Surgeon: Latanya Maudlin, MD;  Location: [facility];  Service: Orthopedics;  Laterality: Left;      (Not in a hospital admission) No Known Allergies  Social History  Substance Use Topics  . Smoking status: Never Smoker  . Smokeless tobacco: Not on file  . Alcohol use Yes     Comment: OCCASIONAL    No family history on file.   Review of Systems  Constitutional: Negative.   HENT: Negative.   Eyes: Negative.   Respiratory: Negative.   Cardiovascular: Negative.   Gastrointestinal: Positive for heartburn.  Genitourinary: Negative.   Musculoskeletal: Positive for joint pain.  Skin: Negative.   Neurological: Negative.   Endo/Heme/Allergies: Negative.   Psychiatric/Behavioral: Negative.     Objective:  Physical Exam  Vitals reviewed. Constitutional: She is oriented to person, place, and time. She appears well-developed and well-nourished.  HENT:  Head: Normocephalic and atraumatic.  Eyes: Conjunctivae and EOM are normal. Pupils are equal, round, and reactive to light.  Neck: Normal range of motion. Neck supple.  Cardiovascular: Normal rate, regular rhythm and intact distal pulses.   Respiratory: Effort normal and breath sounds normal.  GI: Soft. She exhibits no distension.  Genitourinary:  Genitourinary Comments: deferred  Musculoskeletal:       Left hip: She exhibits decreased range of motion and bony tenderness.  Neurological: She is alert and oriented to person, place, and time. She has normal reflexes.  Skin: Skin is warm and dry.  Psychiatric: She has a normal mood and affect. Her behavior is normal. Judgment and thought content normal.    Vital signs in last 24 hours: @VSRANGES @  Labs:   Estimated body mass index is 36.11 kg/m as calculated from the following:  Height as of 05/22/15: 5\' 5"  (1.651 m).   Weight as of 05/22/15: 98.4 kg (217 lb).   Imaging Review Plain radiographs demonstrate severe degenerative joint disease of the left hip(s). The bone quality appears to be adequate for age and reported activity level.  Assessment/Plan:  End stage  arthritis, left hip(s)  The patient history, physical examination, clinical judgement of the provider and imaging studies are consistent with end stage degenerative joint disease of the left hip(s) and total hip arthroplasty is deemed medically necessary. The treatment options including medical management, injection therapy, arthroscopy and arthroplasty were discussed at length. The risks and benefits of total hip arthroplasty were presented and reviewed. The risks due to aseptic loosening, infection, stiffness, dislocation/subluxation,  thromboembolic complications and other imponderables were discussed.  The patient acknowledged the explanation, agreed to proceed with the plan and consent was signed. Patient is being admitted for inpatient treatment for surgery, pain control, PT, OT, prophylactic antibiotics, VTE prophylaxis, progressive ambulation and ADL's and discharge planning.The patient is planning to be discharged home with home health services

## 2016-03-17 ENCOUNTER — Encounter (HOSPITAL_COMMUNITY): Payer: Self-pay

## 2016-03-17 ENCOUNTER — Encounter (HOSPITAL_COMMUNITY)
Admission: RE | Admit: 2016-03-17 | Discharge: 2016-03-17 | Disposition: A | Discharge status: Home / Self Care | Payer: Managed Care, Other (non HMO) | Source: Ambulatory Visit | Attending: Orthopedic Surgery | Admitting: Orthopedic Surgery

## 2016-03-17 DIAGNOSIS — I10 Essential (primary) hypertension: Secondary | ICD-10-CM | POA: Insufficient documentation

## 2016-03-17 DIAGNOSIS — M5126 Other intervertebral disc displacement, lumbar region: Secondary | ICD-10-CM | POA: Insufficient documentation

## 2016-03-17 DIAGNOSIS — M1990 Unspecified osteoarthritis, unspecified site: Secondary | ICD-10-CM | POA: Diagnosis not present

## 2016-03-17 DIAGNOSIS — Z86718 Personal history of other venous thrombosis and embolism: Secondary | ICD-10-CM | POA: Diagnosis not present

## 2016-03-17 DIAGNOSIS — Z01812 Encounter for preprocedural laboratory examination: Secondary | ICD-10-CM | POA: Insufficient documentation

## 2016-03-17 DIAGNOSIS — M549 Dorsalgia, unspecified: Secondary | ICD-10-CM | POA: Insufficient documentation

## 2016-03-17 DIAGNOSIS — M48062 Spinal stenosis, lumbar region with neurogenic claudication: Secondary | ICD-10-CM | POA: Insufficient documentation

## 2016-03-17 LAB — ABO/RH: ABO/RH(D): O POS

## 2016-03-17 LAB — BASIC METABOLIC PANEL
ANION GAP: 7 (ref 5–15)
BUN: 18 mg/dL (ref 6–20)
CALCIUM: 9.3 mg/dL (ref 8.9–10.3)
CO2: 28 mmol/L (ref 22–32)
CREATININE: 0.74 mg/dL (ref 0.44–1.00)
Chloride: 104 mmol/L (ref 101–111)
GFR calc non Af Amer: >60 mL/min (ref >60)
Glucose, Bld: 101 mg/dL — ABNORMAL HIGH (ref 65–99)
Potassium: 4.1 mmol/L (ref 3.5–5.1)
SODIUM: 139 mmol/L (ref 135–145)

## 2016-03-17 LAB — CBC
HCT: 40 % (ref 36.0–46.0)
HEMOGLOBIN: 13.3 g/dL (ref 12.0–15.0)
MCH: 30.4 pg (ref 26.0–34.0)
MCHC: 33.3 g/dL (ref 30.0–36.0)
MCV: 91.3 fL (ref 78.0–100.0)
PLATELETS: 242 10*3/uL (ref 150–400)
RBC: 4.38 MIL/uL (ref 3.87–5.11)
RDW: 12.7 % (ref 11.5–15.5)
WBC: 7.6 10*3/uL (ref 4.0–10.5)

## 2016-03-17 LAB — SURGICAL PCR SCREEN
MRSA, PCR: NEGATIVE
Staphylococcus aureus: POSITIVE — AB

## 2016-03-17 LAB — PROTIME-INR
INR: 1
PROTHROMBIN TIME: 13.2 s (ref 11.4–15.2)

## 2016-03-17 NOTE — Progress Notes (Signed)
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 Results sent to Dr Lyla Glassing. Prescription for Mupriocin Ointment called to [facility]; spoke with pharmacist-Robert. Pt aware.

## 2016-03-17 NOTE — Patient Instructions (Signed)
Danielle Gray  03/17/2016   Your procedure is scheduled on: Thursday March 26, 2016  Report to [facility]  Entrance take [place]  elevators to 3rd floor to  [place] at 10:45 AM.  Call this number if you have problems the morning of surgery [phone]   Remember: ONLY 1 PERSON MAY GO WITH YOU TO SHORT STAY TO GET  READY MORNING OF [place].  Do not eat food or drink liquids :After Midnight.     Take these medicines the morning of surgery with A SIP OF WATER: Ranitidine (Zantac)                                You may not have any metal on your body including hair pins and              piercings  Do not wear jewelry, make-up, lotions, powders or perfumes, deodorant             Do not wear nail polish.  Do not shave  48 hours prior to surgery.     Do not bring valuables to the hospital. [place].  Contacts, dentures or bridgework may not be worn into surgery.  Leave suitcase in the car. After surgery it may be brought to your room.                Please read over the following fact sheets you were given:MRSA INFORMATION SHEET; INCENTIVE SPIROMETER; BLOOD TRANSFUSION INFORMATION SHEET  _____________________________________________________________________             [facility] - Preparing for Surgery Before surgery, you can play an important role.  Because skin is not sterile, your skin needs to be as free of germs as possible.  You can reduce the number of germs on your skin by washing with CHG (chlorahexidine gluconate) soap before surgery.  CHG is an antiseptic cleaner which kills germs and bonds with the skin to continue killing germs even after washing. Please DO NOT use if you have an allergy to CHG or antibacterial soaps.  If your skin becomes reddened/irritated stop using the CHG and inform your nurse when you arrive at Short Stay. Do not shave (including legs and underarms) for  at least 48 hours prior to the first CHG shower.  You may shave your face/neck. Please follow these instructions carefully:  1.  Shower with CHG Soap the night before surgery and the  morning of Surgery.  2.  If you choose to wash your hair, wash your hair first as usual with your  normal  shampoo.  3.  After you shampoo, rinse your hair and body thoroughly to remove the  shampoo.                           4.  Use CHG as you would any other liquid soap.  You can apply chg directly  to the skin and wash                       Gently with a scrungie or clean washcloth.  5.  Apply the CHG Soap to your body ONLY FROM THE  NECK DOWN.   Do not use on face/ open                           Wound or open sores. Avoid contact with eyes, ears mouth and genitals (private parts).                       Wash face,  Genitals (private parts) with your normal soap.             6.  Wash thoroughly, paying special attention to the area where your surgery  will be performed.  7.  Thoroughly rinse your body with warm water from the neck down.  8.  DO NOT shower/wash with your normal soap after using and rinsing off  the CHG Soap.                9.  Pat yourself dry with a clean towel.            10.  Wear clean pajamas.            11.  Place clean sheets on your bed the night of your first shower and do not  sleep with pets. Day of Surgery : Do not apply any lotions/deodorants the morning of surgery.  Please wear clean clothes to the hospital/surgery center.  FAILURE TO FOLLOW THESE INSTRUCTIONS MAY RESULT IN THE CANCELLATION OF YOUR SURGERY PATIENT SIGNATURE_________________________________  NURSE SIGNATURE__________________________________  ________________________________________________________________________   Adam Phenix  An incentive spirometer is a tool that can help keep your lungs clear and active. This tool measures how well you are filling your lungs with each breath. Taking long deep  breaths may help reverse or decrease the chance of developing breathing (pulmonary) problems (especially infection) following:  A long period of time when you are unable to move or be active. BEFORE THE PROCEDURE   If the spirometer includes an indicator to show your best effort, your nurse or respiratory therapist will set it to a desired goal.  If possible, sit up straight or lean slightly forward. Try not to slouch.  Hold the incentive spirometer in an upright position. INSTRUCTIONS FOR USE  1. Sit on the edge of your bed if possible, or sit up as far as you can in bed or on a chair. 2. Hold the incentive spirometer in an upright position. 3. Breathe out normally. 4. Place the mouthpiece in your mouth and seal your lips tightly around it. 5. Breathe in slowly and as deeply as possible, raising the piston or the ball toward the top of the column. 6. Hold your breath for 3-5 seconds or for as long as possible. Allow the piston or ball to fall to the bottom of the column. 7. Remove the mouthpiece from your mouth and breathe out normally. 8. Rest for a few seconds and repeat Steps 1 through 7 at least 10 times every 1-2 hours when you are awake. Take your time and take a few normal breaths between deep breaths. 9. The spirometer may include an indicator to show your best effort. Use the indicator as a goal to work toward during each repetition. 10. After each set of 10 deep breaths, practice coughing to be sure your lungs are clear. If you have an incision (the cut made at the time of surgery), support your incision when coughing by placing a pillow or rolled up towels firmly against it. Once you are able  to get out of bed, walk around indoors and cough well. You may stop using the incentive spirometer when instructed by your caregiver.  RISKS AND COMPLICATIONS  Take your time so you do not get dizzy or light-headed.  If you are in pain, you may need to take or ask for pain medication before  doing incentive spirometry. It is harder to take a deep breath if you are having pain. AFTER USE  Rest and breathe slowly and easily.  It can be helpful to keep track of a log of your progress. Your caregiver can provide you with a simple table to help with this. If you are using the spirometer at home, follow these instructions: [place] IF:   You are having difficultly using the spirometer.  You have trouble using the spirometer as often as instructed.  Your pain medication is not giving enough relief while using the spirometer.  You develop fever of 100.5 F (38.1 C) or higher. SEEK IMMEDIATE MEDICAL CARE IF:   You cough up bloody sputum that had not been present before.  You develop fever of 102 F (38.9 C) or greater.  You develop worsening pain at or near the incision site. MAKE SURE YOU:   Understand these instructions.  Will watch your condition.  Will get help right away if you are not doing well or get worse. Document Released: 08/17/2006 Document Revised: 06/29/2011 Document Reviewed: 10/18/2006 ExitCare Patient Information 2014 ExitCare, [place].   ________________________________________________________________________  WHAT IS A BLOOD TRANSFUSION? Blood Transfusion Information  A transfusion is the replacement of blood or some of its parts. Blood is made up of multiple cells which provide different functions.  Red blood cells carry oxygen and are used for blood loss replacement.  White blood cells fight against infection.  Platelets control bleeding.  Plasma helps clot blood.  Other blood products are available for specialized needs, such as hemophilia or other clotting disorders. BEFORE THE TRANSFUSION  Who gives blood for transfusions?   Healthy volunteers who are fully evaluated to make sure their blood is safe. This is blood bank blood. Transfusion therapy is the safest it has ever been in the practice of medicine. Before blood is taken  from a donor, a complete history is taken to make sure that person has no history of diseases nor engages in risky social behavior (examples are intravenous drug use or sexual activity with multiple partners). The donor's travel history is screened to minimize risk of transmitting infections, such as malaria. The donated blood is tested for signs of infectious diseases, such as HIV and hepatitis. The blood is then tested to be sure it is compatible with you in order to minimize the chance of a transfusion reaction. If you or a relative donates blood, this is often done in anticipation of surgery and is not appropriate for emergency situations. It takes many days to process the donated blood. RISKS AND COMPLICATIONS Although transfusion therapy is very safe and saves many lives, the main dangers of transfusion include:   Getting an infectious disease.  Developing a transfusion reaction. This is an allergic reaction to something in the blood you were given. Every precaution is taken to prevent this. The decision to have a blood transfusion has been considered carefully by your caregiver before blood is given. Blood is not given unless the benefits outweigh the risks. AFTER THE TRANSFUSION  Right after receiving a blood transfusion, you will usually feel much better and more energetic. This is especially true  if your red blood cells have gotten low (anemic). The transfusion raises the level of the red blood cells which carry oxygen, and this usually causes an energy increase.  The nurse administering the transfusion will monitor you carefully for complications. HOME CARE INSTRUCTIONS  No special instructions are needed after a transfusion. You may find your energy is better. Speak with your caregiver about any limitations on activity for underlying diseases you may have. SEEK MEDICAL CARE IF:   Your condition is not improving after your transfusion.  You develop redness or irritation at the  intravenous (IV) site. SEEK IMMEDIATE MEDICAL CARE IF:  Any of the following symptoms occur over the next 12 hours:  Shaking chills.  You have a temperature by mouth above 102 F (38.9 C), not controlled by medicine.  Chest, back, or muscle pain.  People around you feel you are not acting correctly or are confused.  Shortness of breath or difficulty breathing.  Dizziness and fainting.  You get a rash or develop hives.  You have a decrease in urine output.  Your urine turns a dark color or changes to pink, red, or brown. Any of the following symptoms occur over the next 10 days:  You have a temperature by mouth above 102 F (38.9 C), not controlled by medicine.  Shortness of breath.  Weakness after normal activity.  The white part of the eye turns yellow (jaundice).  You have a decrease in the amount of urine or are urinating less often.  Your urine turns a dark color or changes to pink, red, or brown. Document Released: 04/03/2000 Document Revised: 06/29/2011 Document Reviewed: 11/21/2007 [facility] Patient Information 2014 [place], [place].  _______________________________________________________________________

## 2016-03-26 ENCOUNTER — Inpatient Hospital Stay (HOSPITAL_COMMUNITY)
Admission: RE | Admit: 2016-03-26 | Discharge: 2016-03-27 | DRG: 470 | Disposition: A | Discharge status: Home Health | Payer: Managed Care, Other (non HMO) | Source: Ambulatory Visit | Attending: Orthopedic Surgery | Admitting: Orthopedic Surgery

## 2016-03-26 ENCOUNTER — Inpatient Hospital Stay (HOSPITAL_COMMUNITY): Payer: Managed Care, Other (non HMO) | Admitting: Anesthesiology

## 2016-03-26 ENCOUNTER — Inpatient Hospital Stay (HOSPITAL_COMMUNITY): Payer: Managed Care, Other (non HMO)

## 2016-03-26 ENCOUNTER — Encounter (HOSPITAL_COMMUNITY)
Admission: RE | Disposition: A | Discharge status: Home Health | Payer: Self-pay | Source: Ambulatory Visit | Attending: Orthopedic Surgery

## 2016-03-26 ENCOUNTER — Encounter (HOSPITAL_COMMUNITY): Payer: Self-pay | Admitting: Anesthesiology

## 2016-03-26 DIAGNOSIS — K219 Gastro-esophageal reflux disease without esophagitis: Secondary | ICD-10-CM | POA: Diagnosis present

## 2016-03-26 DIAGNOSIS — Z86718 Personal history of other venous thrombosis and embolism: Secondary | ICD-10-CM

## 2016-03-26 DIAGNOSIS — M5126 Other intervertebral disc displacement, lumbar region: Secondary | ICD-10-CM | POA: Diagnosis present

## 2016-03-26 DIAGNOSIS — I10 Essential (primary) hypertension: Secondary | ICD-10-CM | POA: Diagnosis present

## 2016-03-26 DIAGNOSIS — M1612 Unilateral primary osteoarthritis, left hip: Secondary | ICD-10-CM | POA: Diagnosis present

## 2016-03-26 DIAGNOSIS — Z09 Encounter for follow-up examination after completed treatment for conditions other than malignant neoplasm: Secondary | ICD-10-CM

## 2016-03-26 DIAGNOSIS — M48062 Spinal stenosis, lumbar region with neurogenic claudication: Secondary | ICD-10-CM | POA: Diagnosis present

## 2016-03-26 DIAGNOSIS — M25552 Pain in left hip: Secondary | ICD-10-CM

## 2016-03-26 DIAGNOSIS — Z96651 Presence of right artificial knee joint: Secondary | ICD-10-CM | POA: Diagnosis present

## 2016-03-26 HISTORY — PX: TOTAL HIP ARTHROPLASTY: SHX124

## 2016-03-26 LAB — TYPE AND SCREEN
ABO/RH(D): O POS
ANTIBODY SCREEN: NEGATIVE

## 2016-03-26 SURGERY — ARTHROPLASTY, HIP, TOTAL, ANTERIOR APPROACH
Anesthesia: General | Site: Hip | Laterality: Left

## 2016-03-26 MED ORDER — KETOROLAC TROMETHAMINE 15 MG/ML IJ SOLN
15.0000 mg | Freq: Four times a day (QID) | INTRAMUSCULAR | Status: DC
  Administered 2016-03-26 – 2016-03-27 (×3): 15 mg via INTRAVENOUS
  Filled 2016-03-26 (×3): qty 1

## 2016-03-26 MED ORDER — METOCLOPRAMIDE HCL 5 MG/ML IJ SOLN
5.0000 mg | Freq: Three times a day (TID) | INTRAMUSCULAR | Status: DC | PRN
Start: 2016-03-26 — End: 2016-03-27

## 2016-03-26 MED ORDER — HYDROMORPHONE HCL 2 MG/ML IJ SOLN
INTRAMUSCULAR | Status: AC
  Administered 2016-03-26 (×3): 0.5 mg
  Filled 2016-03-26: qty 1

## 2016-03-26 MED ORDER — HYDROMORPHONE HCL 1 MG/ML IJ SOLN
INTRAMUSCULAR | Status: DC | PRN
  Administered 2016-03-26 (×4): 0.5 mg via INTRAVENOUS

## 2016-03-26 MED ORDER — CEFAZOLIN SODIUM-DEXTROSE 2-4 GM/100ML-% IV SOLN
2.0000 g | INTRAVENOUS | Status: AC
  Administered 2016-03-26: 2 g via INTRAVENOUS
  Filled 2016-03-26: qty 100

## 2016-03-26 MED ORDER — SENNA 8.6 MG PO TABS
2.0000 | ORAL_TABLET | Freq: Every day | ORAL | Status: DC
  Administered 2016-03-26: 22:00:00 17.2 mg via ORAL
  Filled 2016-03-26: qty 2

## 2016-03-26 MED ORDER — HYDROMORPHONE HCL 1 MG/ML IJ SOLN: 0.5000 mg | INTRAMUSCULAR | Status: DC | PRN

## 2016-03-26 MED ORDER — CHLORHEXIDINE GLUCONATE 4 % EX LIQD: 60.0000 mL | Freq: Once | CUTANEOUS | Status: DC

## 2016-03-26 MED ORDER — LIDOCAINE 2% (20 MG/ML) 5 ML SYRINGE
INTRAMUSCULAR | Status: DC | PRN
  Administered 2016-03-26: 100 mg via INTRAVENOUS

## 2016-03-26 MED ORDER — LISINOPRIL 10 MG PO TABS
10.0000 mg | ORAL_TABLET | Freq: Every day | ORAL | Status: DC
  Administered 2016-03-27: 09:00:00 10 mg via ORAL
  Filled 2016-03-26: qty 1

## 2016-03-26 MED ORDER — BUPIVACAINE HCL (PF) 0.25 % IJ SOLN
INTRAMUSCULAR | Status: AC
  Filled 2016-03-26: qty 30

## 2016-03-26 MED ORDER — SODIUM CHLORIDE 0.9 % IV SOLN
INTRAVENOUS | Status: DC
  Administered 2016-03-26 – 2016-03-27 (×2): via INTRAVENOUS

## 2016-03-26 MED ORDER — POLYETHYLENE GLYCOL 3350 17 G PO PACK: 17.0000 g | PACK | Freq: Every day | ORAL | Status: DC | PRN

## 2016-03-26 MED ORDER — ROCURONIUM BROMIDE 50 MG/5ML IV SOSY
PREFILLED_SYRINGE | INTRAVENOUS | Status: AC
  Filled 2016-03-26: qty 5

## 2016-03-26 MED ORDER — LACTATED RINGERS IV SOLN
INTRAVENOUS | Status: DC
  Administered 2016-03-26: 14:00:00 via INTRAVENOUS
  Administered 2016-03-26: 1000 mL via INTRAVENOUS
  Administered 2016-03-26: 15:00:00 via INTRAVENOUS

## 2016-03-26 MED ORDER — WATER FOR IRRIGATION, STERILE IR SOLN
Status: DC | PRN
  Administered 2016-03-26: 2000 mL

## 2016-03-26 MED ORDER — LISINOPRIL-HYDROCHLOROTHIAZIDE 10-12.5 MG PO TABS: 1.0000 | ORAL_TABLET | Freq: Every morning | ORAL | Status: DC

## 2016-03-26 MED ORDER — PROMETHAZINE HCL 25 MG/ML IJ SOLN
INTRAMUSCULAR | Status: AC
  Filled 2016-03-26: qty 1

## 2016-03-26 MED ORDER — HYDROCHLOROTHIAZIDE 12.5 MG PO CAPS
12.5000 mg | ORAL_CAPSULE | Freq: Every day | ORAL | Status: DC
  Administered 2016-03-27: 09:00:00 12.5 mg via ORAL
  Filled 2016-03-26: qty 1

## 2016-03-26 MED ORDER — KETOROLAC TROMETHAMINE 30 MG/ML IJ SOLN
INTRAMUSCULAR | Status: DC | PRN
  Administered 2016-03-26: 30 mg

## 2016-03-26 MED ORDER — SODIUM CHLORIDE 0.9 % IJ SOLN
INTRAMUSCULAR | Status: AC
  Filled 2016-03-26: qty 50

## 2016-03-26 MED ORDER — ROCURONIUM BROMIDE 10 MG/ML (PF) SYRINGE
PREFILLED_SYRINGE | INTRAVENOUS | Status: DC | PRN
  Administered 2016-03-26: 10 mg via INTRAVENOUS
  Administered 2016-03-26: 50 mg via INTRAVENOUS
  Administered 2016-03-26: 20 mg via INTRAVENOUS

## 2016-03-26 MED ORDER — ONDANSETRON HCL 4 MG/2ML IJ SOLN: 4.0000 mg | Freq: Four times a day (QID) | INTRAMUSCULAR | Status: DC | PRN

## 2016-03-26 MED ORDER — PROPOFOL 10 MG/ML IV BOLUS
INTRAVENOUS | Status: AC
  Filled 2016-03-26: qty 20

## 2016-03-26 MED ORDER — FAMOTIDINE 20 MG PO TABS
20.0000 mg | ORAL_TABLET | Freq: Every day | ORAL | Status: DC
  Administered 2016-03-26 – 2016-03-27 (×2): 20 mg via ORAL
  Filled 2016-03-26 (×2): qty 1

## 2016-03-26 MED ORDER — LIDOCAINE 2% (20 MG/ML) 5 ML SYRINGE
INTRAMUSCULAR | Status: AC
  Filled 2016-03-26: qty 5

## 2016-03-26 MED ORDER — DOCUSATE SODIUM 100 MG PO CAPS
100.0000 mg | ORAL_CAPSULE | Freq: Two times a day (BID) | ORAL | Status: DC
  Administered 2016-03-26 – 2016-03-27 (×2): 100 mg via ORAL
  Filled 2016-03-26 (×2): qty 1

## 2016-03-26 MED ORDER — CEFAZOLIN SODIUM-DEXTROSE 2-4 GM/100ML-% IV SOLN
2.0000 g | Freq: Four times a day (QID) | INTRAVENOUS | Status: AC
  Administered 2016-03-26 – 2016-03-27 (×2): 2 g via INTRAVENOUS
  Filled 2016-03-26 (×2): qty 100

## 2016-03-26 MED ORDER — HYDROMORPHONE HCL 1 MG/ML IJ SOLN: 0.2500 mg | INTRAMUSCULAR | Status: DC | PRN

## 2016-03-26 MED ORDER — PROPOFOL 10 MG/ML IV BOLUS
INTRAVENOUS | Status: DC | PRN
  Administered 2016-03-26: 150 mg via INTRAVENOUS

## 2016-03-26 MED ORDER — ONDANSETRON HCL 4 MG/2ML IJ SOLN
INTRAMUSCULAR | Status: DC | PRN
  Administered 2016-03-26: 4 mg via INTRAVENOUS

## 2016-03-26 MED ORDER — SODIUM CHLORIDE 0.9 % IJ SOLN
INTRAMUSCULAR | Status: AC
  Filled 2016-03-26: qty 10

## 2016-03-26 MED ORDER — METOCLOPRAMIDE HCL 5 MG PO TABS
5.0000 mg | ORAL_TABLET | Freq: Three times a day (TID) | ORAL | Status: DC | PRN
Start: 2016-03-26 — End: 2016-03-27

## 2016-03-26 MED ORDER — SODIUM CHLORIDE 0.9 % IV SOLN: INTRAVENOUS | Status: DC

## 2016-03-26 MED ORDER — SUGAMMADEX SODIUM 200 MG/2ML IV SOLN
INTRAVENOUS | Status: DC | PRN
  Administered 2016-03-26: 200 mg via INTRAVENOUS

## 2016-03-26 MED ORDER — MIDAZOLAM HCL 2 MG/2ML IJ SOLN
INTRAMUSCULAR | Status: DC | PRN
  Administered 2016-03-26: 2 mg via INTRAVENOUS

## 2016-03-26 MED ORDER — ISOPROPYL ALCOHOL 70 % SOLN
Status: DC | PRN
  Administered 2016-03-26: 1 via TOPICAL

## 2016-03-26 MED ORDER — TRANEXAMIC ACID 1000 MG/10ML IV SOLN
INTRAVENOUS | Status: DC | PRN
  Administered 2016-03-26: 2000 mg via TOPICAL

## 2016-03-26 MED ORDER — VITAMIN D3 25 MCG (1000 UNIT) PO TABS
1000.0000 [IU] | ORAL_TABLET | Freq: Every day | ORAL | Status: DC
  Administered 2016-03-26 – 2016-03-27 (×2): 1000 [IU] via ORAL
  Filled 2016-03-26 (×2): qty 1

## 2016-03-26 MED ORDER — ISOPROPYL ALCOHOL 70 % SOLN
Status: AC
  Filled 2016-03-26: qty 480

## 2016-03-26 MED ORDER — DEXAMETHASONE SODIUM PHOSPHATE 10 MG/ML IJ SOLN
INTRAMUSCULAR | Status: AC
  Filled 2016-03-26: qty 1

## 2016-03-26 MED ORDER — HYDROMORPHONE HCL 2 MG/ML IJ SOLN
INTRAMUSCULAR | Status: AC
  Filled 2016-03-26: qty 1

## 2016-03-26 MED ORDER — METHOCARBAMOL 500 MG PO TABS
500.0000 mg | ORAL_TABLET | Freq: Four times a day (QID) | ORAL | Status: DC | PRN
  Administered 2016-03-27: 08:00:00 500 mg via ORAL
  Filled 2016-03-26: qty 1

## 2016-03-26 MED ORDER — DEXAMETHASONE SODIUM PHOSPHATE 10 MG/ML IJ SOLN
10.0000 mg | Freq: Once | INTRAMUSCULAR | Status: AC
  Administered 2016-03-27: 09:00:00 10 mg via INTRAVENOUS
  Filled 2016-03-26: qty 1

## 2016-03-26 MED ORDER — PROMETHAZINE HCL 25 MG/ML IJ SOLN
6.2500 mg | INTRAMUSCULAR | Status: DC | PRN
  Administered 2016-03-26: 6.25 mg via INTRAVENOUS

## 2016-03-26 MED ORDER — DEXAMETHASONE SODIUM PHOSPHATE 10 MG/ML IJ SOLN
INTRAMUSCULAR | Status: DC | PRN
  Administered 2016-03-26: 10 mg via INTRAVENOUS

## 2016-03-26 MED ORDER — SODIUM CHLORIDE 0.9 % IJ SOLN
INTRAMUSCULAR | Status: DC | PRN
  Administered 2016-03-26: 30 mL

## 2016-03-26 MED ORDER — MENTHOL 3 MG MT LOZG: 1.0000 | LOZENGE | OROMUCOSAL | Status: DC | PRN

## 2016-03-26 MED ORDER — SUFENTANIL CITRATE 50 MCG/ML IV SOLN
INTRAVENOUS | Status: DC | PRN
  Administered 2016-03-26: 20 ug via INTRAVENOUS
  Administered 2016-03-26 (×3): 10 ug via INTRAVENOUS

## 2016-03-26 MED ORDER — MIDAZOLAM HCL 2 MG/2ML IJ SOLN
INTRAMUSCULAR | Status: AC
  Filled 2016-03-26: qty 2

## 2016-03-26 MED ORDER — SUFENTANIL CITRATE 50 MCG/ML IV SOLN
INTRAVENOUS | Status: AC
  Filled 2016-03-26: qty 1

## 2016-03-26 MED ORDER — ACETAMINOPHEN 10 MG/ML IV SOLN
1000.0000 mg | INTRAVENOUS | Status: AC
  Administered 2016-03-26: 1000 mg via INTRAVENOUS
  Filled 2016-03-26: qty 100

## 2016-03-26 MED ORDER — 0.9 % SODIUM CHLORIDE (POUR BTL) OPTIME
TOPICAL | Status: DC | PRN
Start: 2016-03-26 — End: 2016-03-26
  Administered 2016-03-26: 1000 mL

## 2016-03-26 MED ORDER — BUPIVACAINE HCL (PF) 0.25 % IJ SOLN
INTRAMUSCULAR | Status: DC | PRN
  Administered 2016-03-26: 30 mL

## 2016-03-26 MED ORDER — SUGAMMADEX SODIUM 200 MG/2ML IV SOLN
INTRAVENOUS | Status: AC
  Filled 2016-03-26: qty 2

## 2016-03-26 MED ORDER — TRANEXAMIC ACID 1000 MG/10ML IV SOLN
2000.0000 mg | INTRAVENOUS | Status: DC
  Filled 2016-03-26: qty 20

## 2016-03-26 MED ORDER — KETOROLAC TROMETHAMINE 30 MG/ML IJ SOLN
INTRAMUSCULAR | Status: AC
  Filled 2016-03-26: qty 1

## 2016-03-26 MED ORDER — ACETAMINOPHEN 10 MG/ML IV SOLN
INTRAVENOUS | Status: AC
  Filled 2016-03-26: qty 100

## 2016-03-26 MED ORDER — ACETAMINOPHEN 325 MG PO TABS: 650.0000 mg | ORAL_TABLET | Freq: Four times a day (QID) | ORAL | Status: DC | PRN

## 2016-03-26 MED ORDER — HYDROCODONE-ACETAMINOPHEN 5-325 MG PO TABS
1.0000 | ORAL_TABLET | ORAL | Status: DC | PRN
  Administered 2016-03-26: 1 via ORAL
  Administered 2016-03-26 – 2016-03-27 (×4): 2 via ORAL
  Filled 2016-03-26: qty 1
  Filled 2016-03-26 (×4): qty 2

## 2016-03-26 MED ORDER — HYDROGEN PEROXIDE 3 % EX SOLN
CUTANEOUS | Status: AC
  Filled 2016-03-26: qty 473

## 2016-03-26 MED ORDER — ONDANSETRON HCL 4 MG/2ML IJ SOLN
INTRAMUSCULAR | Status: AC
  Filled 2016-03-26: qty 2

## 2016-03-26 MED ORDER — POVIDONE-IODINE 10 % EX SWAB: 2.0000 "application " | Freq: Once | CUTANEOUS | Status: DC

## 2016-03-26 MED ORDER — CEFAZOLIN SODIUM-DEXTROSE 2-4 GM/100ML-% IV SOLN
INTRAVENOUS | Status: AC
  Filled 2016-03-26: qty 100

## 2016-03-26 MED ORDER — PHENOL 1.4 % MT LIQD
1.0000 | OROMUCOSAL | Status: DC | PRN
  Filled 2016-03-26: qty 177

## 2016-03-26 MED ORDER — METHOCARBAMOL 1000 MG/10ML IJ SOLN
500.0000 mg | Freq: Four times a day (QID) | INTRAVENOUS | Status: DC | PRN
  Administered 2016-03-26: 500 mg via INTRAVENOUS
  Filled 2016-03-26: qty 550
  Filled 2016-03-26 (×2): qty 5

## 2016-03-26 MED ORDER — APIXABAN 2.5 MG PO TABS
2.5000 mg | ORAL_TABLET | Freq: Two times a day (BID) | ORAL | Status: DC
  Administered 2016-03-27: 2.5 mg via ORAL
  Filled 2016-03-26: qty 1

## 2016-03-26 MED ORDER — DIPHENHYDRAMINE HCL 12.5 MG/5ML PO ELIX: 12.5000 mg | ORAL_SOLUTION | ORAL | Status: DC | PRN

## 2016-03-26 MED ORDER — ACETAMINOPHEN 650 MG RE SUPP: 650.0000 mg | Freq: Four times a day (QID) | RECTAL | Status: DC | PRN

## 2016-03-26 MED ORDER — ONDANSETRON HCL 4 MG PO TABS: 4.0000 mg | ORAL_TABLET | Freq: Four times a day (QID) | ORAL | Status: DC | PRN

## 2016-03-26 SURGICAL SUPPLY — 50 items
ADH SKN CLS APL DERMABOND .7 (GAUZE/BANDAGES/DRESSINGS) ×2
BAG DECANTER FOR FLEXI CONT (MISCELLANEOUS) ×2 IMPLANT
BAG SPEC THK2 15X12 ZIP CLS (MISCELLANEOUS) ×1
BAG ZIPLOCK 12X15 (MISCELLANEOUS) ×2 IMPLANT
CAPT HIP TOTAL 2 ×3 IMPLANT
CHLORAPREP W/TINT 26ML (MISCELLANEOUS) ×3 IMPLANT
CLOTH BEACON ORANGE TIMEOUT ST (SAFETY) ×3 IMPLANT
COVER PERINEAL POST (MISCELLANEOUS) ×3 IMPLANT
DECANTER SPIKE VIAL GLASS SM (MISCELLANEOUS) ×3 IMPLANT
DERMABOND ADVANCED (GAUZE/BANDAGES/DRESSINGS) ×4
DERMABOND ADVANCED .7 DNX12 (GAUZE/BANDAGES/DRESSINGS) ×2 IMPLANT
DRAPE SHEET LG 3/4 BI-LAMINATE (DRAPES) ×8 IMPLANT
DRAPE STERI IOBAN 125X83 (DRAPES) ×3 IMPLANT
DRAPE U-SHAPE 47X51 STRL (DRAPES) ×6 IMPLANT
DRSG AQUACEL AG ADV 3.5X10 (GAUZE/BANDAGES/DRESSINGS) ×3 IMPLANT
ELECT PENCIL ROCKER SW 15FT (MISCELLANEOUS) ×3 IMPLANT
ELECT REM PT RETURN 15FT ADLT (MISCELLANEOUS) ×3 IMPLANT
GAUZE SPONGE 4X4 12PLY STRL (GAUZE/BANDAGES/DRESSINGS) ×3 IMPLANT
GLOVE BIO SURGEON STRL SZ8.5 (GLOVE) ×8 IMPLANT
GLOVE BIOGEL PI IND STRL 6.5 (GLOVE) IMPLANT
GLOVE BIOGEL PI IND STRL 8.5 (GLOVE) ×1 IMPLANT
GLOVE BIOGEL PI INDICATOR 6.5 (GLOVE) ×2
GLOVE BIOGEL PI INDICATOR 8.5 (GLOVE) ×2
GLOVE SURG SS PI 7.0 STRL IVOR (GLOVE) ×2 IMPLANT
GLOVE SURG SS PI 7.5 STRL IVOR (GLOVE) ×2 IMPLANT
GOWN SPEC L3 XXLG W/TWL (GOWN DISPOSABLE) ×3 IMPLANT
HANDPIECE INTERPULSE COAX TIP (DISPOSABLE) ×3
HOLDER FOLEY CATH W/STRAP (MISCELLANEOUS) ×3 IMPLANT
HOOD PEEL AWAY FLYTE STAYCOOL (MISCELLANEOUS) ×6 IMPLANT
MARKER SKIN DUAL TIP RULER LAB (MISCELLANEOUS) ×3 IMPLANT
NDL SPNL 18GX3.5 QUINCKE PK (NEEDLE) ×1 IMPLANT
NEEDLE SPNL 18GX3.5 QUINCKE PK (NEEDLE) ×3 IMPLANT
PACK ANTERIOR HIP CUSTOM (KITS) ×3 IMPLANT
SAW OSC TIP CART 19.5X105X1.3 (SAW) ×3 IMPLANT
SEALER BIPOLAR AQUA 6.0 (INSTRUMENTS) ×3 IMPLANT
SET HNDPC FAN SPRY TIP SCT (DISPOSABLE) ×1 IMPLANT
SOL PREP POV-IOD 4OZ 10% (MISCELLANEOUS) ×1 IMPLANT
SUT ETHIBOND NAB CT1 #1 30IN (SUTURE) ×6 IMPLANT
SUT MNCRL AB 3-0 PS2 18 (SUTURE) ×3 IMPLANT
SUT MON AB 2-0 CT1 36 (SUTURE) ×6 IMPLANT
SUT STRATAFIX PDO 1 14 VIOLET (SUTURE) ×3
SUT STRATFX PDO 1 14 VIOLET (SUTURE) ×1
SUT VIC AB 2-0 CT1 27 (SUTURE) ×3
SUT VIC AB 2-0 CT1 TAPERPNT 27 (SUTURE) ×1 IMPLANT
SUTURE STRATFX PDO 1 14 VIOLET (SUTURE) ×1 IMPLANT
SYR 50ML LL SCALE MARK (SYRINGE) ×3 IMPLANT
TRAY FOLEY CATH 14FRSI W/METER (CATHETERS) ×2 IMPLANT
TRAY FOLEY W/METER SILVER 16FR (SET/KITS/TRAYS/PACK) IMPLANT
WATER STERILE IRR 1500ML POUR (IV SOLUTION) ×3 IMPLANT
YANKAUER SUCT BULB TIP 10FT TU (MISCELLANEOUS) ×3 IMPLANT

## 2016-03-26 NOTE — Interval H&P Note (Signed)
History and Physical Interval Note:  03/26/2016 12:39 PM  Danielle Gray  has presented today for surgery, with the diagnosis of left hip DJD  The various methods of treatment have been discussed with the patient and family. After consideration of risks, benefits and other options for treatment, the patient has consented to  Procedure(s) with comments: LEFT TOTAL HIP ARTHROPLASTY ANTERIOR APPROACH (Left) - Nedds RNFA as a surgical intervention .  The patient's history has been reviewed, patient examined, no change in status, stable for surgery.  I have reviewed the patient's chart and labs.  Questions were answered to the patient's satisfaction.     Zelphia Glover, Horald Pollen

## 2016-03-26 NOTE — Anesthesia Preprocedure Evaluation (Addendum)
Anesthesia Evaluation  Patient identified by MRN, date of birth, ID band Patient awake    Reviewed: Allergy & Precautions, NPO status , Patient's Chart, lab work & pertinent test results  Airway Mallampati: II  TM Distance: >3 FB Neck ROM: Full    Dental  (+) Teeth Intact, Dental Advisory Given   Pulmonary neg pulmonary ROS,    breath sounds clear to auscultation       Cardiovascular hypertension,  Rhythm:Regular Rate:Normal     Neuro/Psych negative neurological ROS     GI/Hepatic Neg liver ROS, GERD  ,  Endo/Other  negative endocrine ROS  Renal/GU negative Renal ROS     Musculoskeletal  (+) Arthritis , Lumbar surg this year   Abdominal   Peds  Hematology negative hematology ROS (+)   Anesthesia Other Findings   Reproductive/Obstetrics                            Anesthesia Physical Anesthesia Plan  ASA: II  Anesthesia Plan: General   Post-op Pain Management:    Induction: Intravenous  Airway Management Planned: Oral ETT  Additional Equipment:   Intra-op Plan:   Post-operative Plan: Extubation in OR  Informed Consent: I have reviewed the patients History and Physical, chart, labs and discussed the procedure including the risks, benefits and alternatives for the proposed anesthesia with the patient or authorized representative who has indicated his/her understanding and acceptance.   Dental advisory given  Plan Discussed with: CRNA  Anesthesia Plan Comments:         Anesthesia Quick Evaluation

## 2016-03-26 NOTE — Transfer of Care (Signed)
Immediate Anesthesia Transfer of Care Note  Patient: Danielle Gray  Procedure(s) Performed: Procedure(s) with comments: LEFT TOTAL HIP ARTHROPLASTY ANTERIOR APPROACH (Left) - Nedds RNFA  Patient Location: PACU  Anesthesia Type:General  Level of Consciousness: awake, alert  and oriented  Airway & Oxygen Therapy: Patient Spontanous Breathing and Patient connected to face mask oxygen  Post-op Assessment: Report given to RN and Post -op Vital signs reviewed and stable  Post vital signs: Reviewed and stable  Last Vitals:  Vitals:   03/26/16 1059  BP: 140/64  Pulse: 72  Resp: 16  Temp: 36.6 C    Last Pain:  Vitals:   03/26/16 1059  TempSrc: Oral      Patients Stated Pain Goal: 4 ([id number])  Complications: No apparent anesthesia complications

## 2016-03-26 NOTE — H&P (View-Only) (Signed)
TOTAL HIP ADMISSION H&P  Patient is admitted for left total hip arthroplasty.  Subjective:  Chief Complaint: left hip pain  HPI: Danielle Gray, 65 y.o. female, has a history of pain and functional disability in the left hip(s) due to arthritis and patient has failed non-surgical conservative treatments for greater than 12 weeks to include NSAID's and/or analgesics, viscosupplementation injections, use of assistive devices, weight reduction as appropriate and activity modification.  Onset of symptoms was gradual starting 1 years ago with gradually worsening course since that time.The patient noted no past surgery on the left hip(s).  Patient currently rates pain in the left hip at 10 out of 10 with activity. Patient has night pain, worsening of pain with activity and weight bearing, pain that interfers with activities of daily living, pain with passive range of motion and crepitus. Patient has evidence of subchondral cysts, subchondral sclerosis, periarticular osteophytes and joint space narrowing by imaging studies. This condition presents safety issues increasing the risk of falls. There is no current active infection.  Patient Active Problem List   Diagnosis Date Noted  . Spinal stenosis, lumbar region, with neurogenic claudication 05/22/2015   Past Medical History:  Diagnosis Date  . Arthritis   . Back pain   . GERD (gastroesophageal reflux disease)   . 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 Hypertension   . Lumbar herniated disc     Past Surgical History:  Procedure Laterality Date  . BLADDER TACK  25 YRS AGO  . JOINT REPLACEMENT  2006   RT TOTAL KNEE  . LUMBAR LAMINECTOMY/DECOMPRESSION MICRODISCECTOMY Left 05/22/2015   Procedure: CENTRAL DECOMPRESSION LUMBAR LAMINECTOMY L3-4 AND L4-5, 2 LEVEL SPINAL STENOSIS WITH MICRODISCECTOMY L3-4 ON LEFT AND FORAMINOTOMY L3-4 AND L4-5 LEFT;  Surgeon: Latanya Maudlin, MD;  Location: [facility];  Service: Orthopedics;  Laterality: Left;      (Not in a hospital admission) No Known Allergies  Social History  Substance Use Topics  . Smoking status: Never Smoker  . Smokeless tobacco: Not on file  . Alcohol use Yes     Comment: OCCASIONAL    No family history on file.   Review of Systems  Constitutional: Negative.   HENT: Negative.   Eyes: Negative.   Respiratory: Negative.   Cardiovascular: Negative.   Gastrointestinal: Positive for heartburn.  Genitourinary: Negative.   Musculoskeletal: Positive for joint pain.  Skin: Negative.   Neurological: Negative.   Endo/Heme/Allergies: Negative.   Psychiatric/Behavioral: Negative.     Objective:  Physical Exam  Vitals reviewed. Constitutional: She is oriented to person, place, and time. She appears well-developed and well-nourished.  HENT:  Head: Normocephalic and atraumatic.  Eyes: Conjunctivae and EOM are normal. Pupils are equal, round, and reactive to light.  Neck: Normal range of motion. Neck supple.  Cardiovascular: Normal rate, regular rhythm and intact distal pulses.   Respiratory: Effort normal and breath sounds normal.  GI: Soft. She exhibits no distension.  Genitourinary:  Genitourinary Comments: deferred  Musculoskeletal:       Left hip: She exhibits decreased range of motion and bony tenderness.  Neurological: She is alert and oriented to person, place, and time. She has normal reflexes.  Skin: Skin is warm and dry.  Psychiatric: She has a normal mood and affect. Her behavior is normal. Judgment and thought content normal.    Vital signs in last 24 hours: @VSRANGES @  Labs:   Estimated body mass index is 36.11 kg/m as calculated from the following:  Height as of 05/22/15: 5\' 5"  (1.651 m).   Weight as of 05/22/15: 98.4 kg (217 lb).   Imaging Review Plain radiographs demonstrate severe degenerative joint disease of the left hip(s). The bone quality appears to be adequate for age and reported activity level.  Assessment/Plan:  End stage  arthritis, left hip(s)  The patient history, physical examination, clinical judgement of the provider and imaging studies are consistent with end stage degenerative joint disease of the left hip(s) and total hip arthroplasty is deemed medically necessary. The treatment options including medical management, injection therapy, arthroscopy and arthroplasty were discussed at length. The risks and benefits of total hip arthroplasty were presented and reviewed. The risks due to aseptic loosening, infection, stiffness, dislocation/subluxation,  thromboembolic complications and other imponderables were discussed.  The patient acknowledged the explanation, agreed to proceed with the plan and consent was signed. Patient is being admitted for inpatient treatment for surgery, pain control, PT, OT, prophylactic antibiotics, VTE prophylaxis, progressive ambulation and ADL's and discharge planning.The patient is planning to be discharged home with home health services

## 2016-03-26 NOTE — Anesthesia Procedure Notes (Signed)
Procedure Name: Intubation Date/Time: 03/26/2016 12:51 PM Performed by: Danley Danker L Patient Re-evaluated:Patient Re-evaluated prior to inductionOxygen Delivery Method: Circle system utilized Preoxygenation: Pre-oxygenation with 100% oxygen Intubation Type: IV induction Ventilation: Mask ventilation without difficulty and Oral airway inserted - appropriate to patient size Laryngoscope Size: Miller and 2 Grade View: Grade I Tube size: 7.5 mm Number of attempts: 1 Airway Equipment and Method: Stylet Placement Confirmation: ETT inserted through vocal cords under direct vision,  positive ETCO2 and breath sounds checked- equal and bilateral Secured at: 22 cm Tube secured with: Tape Dental Injury: Teeth and Oropharynx as per pre-operative assessment

## 2016-03-26 NOTE — Op Note (Signed)
OPERATIVE REPORT  SURGEON: Rod Can, MD   ASSISTANT: Roberto Scales, RNFA.  PREOPERATIVE DIAGNOSIS: Left hip arthritis.   POSTOPERATIVE DIAGNOSIS: Left hip arthritis.   PROCEDURE: Left total hip arthroplasty, anterior approach.   IMPLANTS: DePuy Tri Lock stem, size 7, hi offset. DePuy Pinnacle Cup, size 54 mm. DePuy Altrx liner, size 36 by 54 mm, neutral. DePuy Biolox ceramic head ball, size 36 + 1.5 mm.  ANESTHESIA:  General  ESTIMATED BLOOD LOSS: 350 mL.  ANTIBIOTICS: 2 g Ancef.  DRAINS: None.  COMPLICATIONS: None.   CONDITION: PACU - hemodynamically stable.Marland Kitchen   BRIEF CLINICAL NOTE: Danielle Gray is a 65 y.o. female with a long-standing history of Left hip arthritis. After failing conservative management, the patient was indicated for total hip arthroplasty. The risks, benefits, and alternatives to the procedure were explained, and the patient elected to proceed.  PROCEDURE IN DETAIL: Surgical site was marked by myself. Once inside the operative room, general anesthesia was induced. The patient was then positioned on the Hana table. All bony prominences were well padded. The hip was prepped and draped in the normal sterile surgical fashion. A time-out was called verifying side and site of surgery. The patient received IV antibiotics within 60 minutes of beginning the procedure.  The direct anterior approach to the hip was performed through the Hueter interval. Lateral femoral circumflex vessels were treated with the Auqumantys. The anterior capsule was exposed and an inverted T capsulotomy was made.The femoral neck cut was made to the level of the templated cut. A corkscrew was placed into the head and the head was removed. The femoral head was found to have eburnated bone. The head was passed to the back table and was measured.  Acetabular exposure was achieved, and the pulvinar and labrum were excised. Sequental reaming of the acetabulum was then  performed up to a size 53 mm reamer. A 54 mm cup was then opened and impacted into place at approximately 40 degrees of abduction and 20 degrees of anteversion. The final polyethylene liner was impacted into place and acetabular osteophytes were removed.   I then gained femoral exposure taking care to protect the abductors and greater trochanter. This was performed using standard external rotation, extension, and adduction. The capsule was peeled off the inner aspect of the greater trochanter, taking care to preserve the short external rotators. A cookie cutter was used to enter the femoral canal, and then the femoral canal finder was placed. Sequential broaching was performed up to a size 7. Calcar planer was used on the femoral neck remnant. I placed a hi offset neck and a trial head ball. The hip was reduced. Leg lengths and offset were checked fluoroscopically. The hip was dislocated and trial components were removed. The final implants were placed, and the hip was reduced.  Fluoroscopy was used to confirm component position and leg lengths. At 90 degrees of external rotation and full extension, the hip was stable to an anterior directed force.  The wound was copiously irrigated with a dilute betadine solution followed by normal saline. Marcaine solution was injected into the periarticular soft tissue. The wound was closed in layers using #1 Vicryl and Stratafix for the fascia, 2-0 Vicryl for the subcutaneous fat, 2-0 Monocryl for the deep dermal layer, 3-0 running Monocryl subcuticular stitch, and Dermabond for the skin. Once the glue was fully dried, an Aquacell Ag dressing was applied. The patient was transported to the recovery room in stable condition. Sponge, needle, and instrument counts were  correct at the end of the case x2. The patient tolerated the procedure well and there were no known complications.

## 2016-03-26 NOTE — Anesthesia Postprocedure Evaluation (Signed)
Anesthesia Post Note  Patient: Danielle Gray  Procedure(s) Performed: Procedure(s) (LRB): LEFT TOTAL HIP ARTHROPLASTY ANTERIOR APPROACH (Left)  Patient location during evaluation: PACU Anesthesia Type: General Level of consciousness: awake and alert Pain management: pain level controlled Vital Signs Assessment: post-procedure vital signs reviewed and stable Respiratory status: spontaneous breathing, nonlabored ventilation, respiratory function stable and patient connected to nasal cannula oxygen Cardiovascular status: blood pressure returned to baseline and stable Postop Assessment: no signs of nausea or vomiting Anesthetic complications: no    Last Vitals:  Vitals:   03/26/16 1630 03/26/16 1644  BP: (!) 117/57   Pulse: 75 65  Resp: 16 16  Temp:  36.4 C    Last Pain:  Vitals:   03/26/16 1644  TempSrc:   PainSc: Asleep                 Darnel Mchan,JAMES TERRILL

## 2016-03-27 ENCOUNTER — Encounter (HOSPITAL_COMMUNITY): Payer: Self-pay | Admitting: Orthopedic Surgery

## 2016-03-27 LAB — CBC
HCT: 32.5 % — ABNORMAL LOW (ref 36.0–46.0)
HEMOGLOBIN: 10.9 g/dL — AB (ref 12.0–15.0)
MCH: 31.1 pg (ref 26.0–34.0)
MCHC: 33.5 g/dL (ref 30.0–36.0)
MCV: 92.6 fL (ref 78.0–100.0)
Platelets: 227 10*3/uL (ref 150–400)
RBC: 3.51 MIL/uL — ABNORMAL LOW (ref 3.87–5.11)
RDW: 12.9 % (ref 11.5–15.5)
WBC: 14.1 10*3/uL — AB (ref 4.0–10.5)

## 2016-03-27 LAB — BASIC METABOLIC PANEL
Anion gap: 5 (ref 5–15)
BUN: 14 mg/dL (ref 6–20)
CALCIUM: 8.5 mg/dL — AB (ref 8.9–10.3)
CO2: 26 mmol/L (ref 22–32)
CREATININE: 0.63 mg/dL (ref 0.44–1.00)
Chloride: 108 mmol/L (ref 101–111)
GFR calc non Af Amer: >60 mL/min (ref >60)
Glucose, Bld: 144 mg/dL — ABNORMAL HIGH (ref 65–99)
Potassium: 4.3 mmol/L (ref 3.5–5.1)
SODIUM: 139 mmol/L (ref 135–145)

## 2016-03-27 MED ORDER — DOCUSATE SODIUM 100 MG PO CAPS: 100.0000 mg | ORAL_CAPSULE | Freq: Two times a day (BID) | ORAL | 0 refills | Status: DC

## 2016-03-27 MED ORDER — APIXABAN 2.5 MG PO TABS: 2.5000 mg | ORAL_TABLET | Freq: Two times a day (BID) | ORAL | 0 refills | Status: DC

## 2016-03-27 MED ORDER — HYDROCODONE-ACETAMINOPHEN 5-325 MG PO TABS: 1.0000 | ORAL_TABLET | ORAL | 0 refills | Status: DC | PRN

## 2016-03-27 MED ORDER — METHOCARBAMOL 500 MG PO TABS: 500.0000 mg | ORAL_TABLET | Freq: Four times a day (QID) | ORAL | 0 refills | Status: DC | PRN

## 2016-03-27 MED ORDER — SENNA 8.6 MG PO TABS: 2.0000 | ORAL_TABLET | Freq: Every day | ORAL | 0 refills | Status: DC

## 2016-03-27 MED ORDER — ONDANSETRON HCL 4 MG PO TABS: 4.0000 mg | ORAL_TABLET | Freq: Four times a day (QID) | ORAL | 0 refills | Status: DC | PRN

## 2016-03-27 NOTE — Progress Notes (Signed)
Discharge planning, spoke with patient at beside. Chose [facility] for HH services, contacted [facility] for referral. Has RW and 3-n-1. [phone] 

## 2016-03-27 NOTE — Progress Notes (Signed)
   Subjective:  Patient reports pain as mild to moderate.  Denies N/V/CP/SOB.  Objective:   VITALS:   Vitals:   03/26/16 1900 03/26/16 2157 03/27/16 0224 03/27/16 0608  BP: 127/65 (!) 131/59 (!) 121/55 (!) 114/49  Pulse: 76 78 67 63  Resp: 15 16 16 16   Temp: 98 F (36.7 C) 97.6 F (36.4 C) 97.7 F (36.5 C) 97.6 F (36.4 C)  TempSrc: Oral Oral Oral Oral  SpO2: 100% 99% 99% 100%  Weight:      Height:        NAD ABD soft Sensation intact distally Intact pulses distally Dorsiflexion/Plantar flexion intact Incision: dressing C/D/I Compartment soft    Lab Results  Component Value Date   WBC 14.1 (H) 03/27/2016   HGB 10.9 (L) 03/27/2016   HCT 32.5 (L) 03/27/2016   MCV 92.6 03/27/2016   PLT 227 03/27/2016   BMET    Component Value Date/Time   NA 139 03/27/2016 0419   K 4.3 03/27/2016 0419   CL 108 03/27/2016 0419   CO2 26 03/27/2016 0419   GLUCOSE 144 (H) 03/27/2016 0419   BUN 14 03/27/2016 0419   CREATININE 0.63 03/27/2016 0419   CALCIUM 8.5 (L) 03/27/2016 0419   GFRNONAA >60 03/27/2016 0419   GFRAA >60 03/27/2016 0419     Assessment/Plan: 1 Day Post-Op   Principal Problem:   Primary osteoarthritis of left hip Active Problems:   Osteoarthritis of left hip   WBAT with walker DVT ppx: apixaban, SCDs, TEDs PO pain control PT/OT D/C home with HHPT   Xayden Linsey, Horald Pollen 03/27/2016, 7:11 AM   Rod Can, MD Cell [phone]

## 2016-03-27 NOTE — Evaluation (Signed)
Occupational Therapy Evaluation Patient Details Name: Danielle Gray MRN: [id number] DOB: 10-Jul-1950 Today's Date: 03/27/2016    History of Present Illness s/p L DA THA, h/o back sx   Clinical Impression   This 65 year old female was admitted for the above sx. All education was completed. No further OT is needed at this time    Follow Up Recommendations  No OT follow up    Equipment Recommendations  None recommended by OT    Recommendations for Other Services       Precautions / Restrictions Precautions Precautions: Fall Restrictions Weight Bearing Restrictions: No      Mobility Bed Mobility               General bed mobility comments: supervision, HOB raised  Transfers Overall transfer level: Needs assistance Equipment used: Rolling walker (2 wheeled) Transfers: Sit to/from Stand Sit to Stand: Min guard;Supervision         General transfer comment: for safety    Balance                                            ADL Overall ADL's : Needs assistance/impaired             Lower Body Bathing: Minimal assistance;Sit to/from stand       Lower Body Dressing: Minimal assistance;Sit to/from stand (pants and underwear)   Toilet Transfer: Min guard;Comfort height toilet;RW   Toileting- Water quality scientist and Hygiene: Min guard;Sit to/from stand   Tub/ Shower Transfer: Min guard;Walk-in shower     General ADL Comments: ambulated to bathroom and performed ADL. Sat on 3:1, which was high--min a for LB adls.  Pt demonstrated good safety     Vision     Perception     Praxis      Pertinent Vitals/Pain       Hand Dominance     Extremity/Trunk Assessment             Communication Communication Communication: No difficulties   Cognition Arousal/Alertness: Awake/alert Behavior During Therapy: WFL for tasks assessed/performed Overall Cognitive Status: Within Functional Limits for tasks assessed                      General Comments       Exercises       Shoulder Instructions      Home Living Family/patient expects to be discharged to:: Private residence Living Arrangements: Spouse/significant other Available Help at Discharge: Family;Available PRN/intermittently               Bathroom Shower/Tub: Walk-in Psychologist, prison and probation services: Standard     Home Equipment: Environmental consultant - 2 wheels;Cane - single point;Bedside commode          Prior Functioning/Environment Level of Independence: Independent                 OT Problem List:     OT Treatment/Interventions:      OT Goals(Current goals can be found in the care plan section) Acute Rehab OT Goals Patient Stated Goal: home OT Goal Formulation: All assessment and education complete, DC therapy  OT Frequency:     Barriers to D/C:            Co-evaluation              End of Session  Activity Tolerance: Patient tolerated treatment well Patient left: in chair;with call bell/phone within reach;with family/visitor present   Time: [id number] OT Time Calculation (min): 24 min Charges:  OT General Charges $OT Visit: 1 Procedure OT Evaluation $OT Eval Low Complexity: 1 Procedure G-Codes:    Aleeah Greeno Apr 16, 2016, 8:25 AM  Lesle Chris, OTR/L [phone] 04/16/2016

## 2016-03-27 NOTE — Evaluation (Signed)
Physical Therapy Evaluation Patient Details Name: Danielle Gray MRN: [id number] DOB: [birth year] Today's Date: 03/27/2016   History of Present Illness  s/p L DA THA, h/o back sx  Clinical Impression  Pt s/p L THR presents with decreased L LE strength/ROM and post op pain limiting functional mobility.  Pt should progress to dc home with family assist and HHPT.    Follow Up Recommendations Home health PT    Equipment Recommendations  None recommended by PT    Recommendations for Other Services OT consult     Precautions / Restrictions Precautions Precautions: Fall Restrictions Weight Bearing Restrictions: No Other Position/Activity Restrictions: WBAT      Mobility  Bed Mobility               General bed mobility comments: OOB with OT  Transfers Overall transfer level: Needs assistance Equipment used: Rolling walker (2 wheeled) Transfers: Sit to/from Stand Sit to Stand: Supervision         General transfer comment: for safety  Ambulation/Gait Ambulation/Gait assistance: Min guard;Supervision Ambulation Distance (Feet): 200 Feet Assistive device: Rolling walker (2 wheeled) Gait Pattern/deviations: Step-to pattern;Step-through pattern;Decreased step length - right;Decreased step length - left;Shuffle;Trunk flexed Gait velocity: decr Gait velocity interpretation: Below normal speed for age/gender General Gait Details: cues for posture, position from RW and initial sequence  Stairs Stairs: Yes   Stair Management: One rail Right;Step to pattern;Forwards;With cane Number of Stairs: 2 General stair comments: cues for sequence and foot/cane placement  Wheelchair Mobility    Modified Rankin (Stroke Patients Only)       Balance                                             Pertinent Vitals/Pain Pain Assessment: 0-10 Pain Score: 6  Pain Location: L hip Pain Descriptors / Indicators: Aching;Sore Pain Intervention(s): Limited activity  within patient's tolerance;Monitored during session;Premedicated before session;Ice applied    Home Living Family/patient expects to be discharged to:: Private residence Living Arrangements: Spouse/significant other Available Help at Discharge: Family;Available PRN/intermittently Type of Home: House Home Access: Stairs to enter Entrance Stairs-Rails: Right Entrance Stairs-Number of Steps: 6 Home Layout: One level Home Equipment: Walker - 2 wheels;Cane - single point;Bedside commode Additional Comments: can borrow RW, BSC    Prior Function Level of Independence: Independent               Hand Dominance   Dominant Hand: Right    Extremity/Trunk Assessment   Upper Extremity Assessment: Overall WFL for tasks assessed           Lower Extremity Assessment: LLE deficits/detail   LLE Deficits / Details: Strength at hip 2+/5 with AAROM at hip to 80 flex and 15 abd  Cervical / Trunk Assessment: Normal  Communication   Communication: No difficulties  Cognition Arousal/Alertness: Awake/alert Behavior During Therapy: WFL for tasks assessed/performed Overall Cognitive Status: Within Functional Limits for tasks assessed                      General Comments      Exercises Total Joint Exercises Ankle Circles/Pumps: AROM;Both;15 reps;Supine Quad Sets: AROM;Both;10 reps;Supine Heel Slides: AAROM;Left;20 reps;Supine Hip ABduction/ADduction: AAROM;Left;15 reps;Supine Long Arc Quad: AROM;Left;10 reps;Seated   Assessment/Plan    PT Assessment Patient needs continued PT services  PT Problem List Decreased strength;Decreased range of motion;Decreased activity tolerance;Decreased mobility;Pain;Decreased knowledge  of use of DME          PT Treatment Interventions DME instruction;Gait training;Stair training;Functional mobility training;Therapeutic activities;Therapeutic exercise;Patient/family education    PT Goals (Current goals can be found in the Care Plan  section)  Acute Rehab PT Goals Patient Stated Goal: home PT Goal Formulation: With patient Time For Goal Achievement: 03/29/16 Potential to Achieve Goals: Good    Frequency 7X/week   Barriers to discharge        Co-evaluation               End of Session Equipment Utilized During Treatment: Gait belt Activity Tolerance: Patient tolerated treatment well Patient left: in chair;with call bell/phone within reach;with family/visitor present Nurse Communication: Mobility status         Time: [id number] PT Time Calculation (min) (ACUTE ONLY): 38 min   Charges:   PT Evaluation $PT Eval Low Complexity: 1 Procedure PT Treatments $Gait Training: 8-22 mins $Therapeutic Exercise: 8-22 mins   PT G Codes:        Danielle Gray 04-19-16, 12:40 PM

## 2016-03-27 NOTE — Discharge Instructions (Signed)
 Dr. Brian Swinteck Joint Replacement Specialist [facility] [address] [place], [place] [address] [phone]   TOTAL HIP REPLACEMENT POSTOPERATIVE DIRECTIONS    Hip Rehabilitation, Guidelines Following Surgery   WEIGHT BEARING Weight bearing as tolerated with assist device (walker, cane, etc) as directed, use it as long as suggested by your surgeon or therapist, typically at least 4-6 weeks.  The results of a hip operation are greatly improved after range of motion and muscle strengthening exercises. Follow all safety measures which are given to protect your hip. If any of these exercises cause increased pain or swelling in your joint, decrease the amount until you are comfortable again. Then slowly increase the exercises. Call your caregiver if you have problems or questions.   HOME CARE INSTRUCTIONS  Most of the following instructions are designed to prevent the dislocation of your new hip.  Remove items at home which could result in a fall. This includes throw rugs or furniture in walking pathways.  Continue medications as instructed at time of discharge.  You may have some home medications which will be placed on hold until you complete the course of blood thinner medication.  You may start showering once you are discharged home. Do not remove your dressing. Do not put on socks or shoes without following the instructions of your caregivers.   Sit on chairs with arms. Use the chair arms to help push yourself up when arising.  Arrange for the use of a toilet seat elevator so you are not sitting low.   Walk with walker as instructed.  You may resume a sexual relationship in one month or when given the OK by your caregiver.  Use walker as long as suggested by your caregivers.  You may put full weight on your legs and walk as much as is comfortable. Avoid periods of inactivity such as sitting longer than an hour when not asleep. This helps prevent  blood clots.  You may return to work once you are cleared by your surgeon.  Do not drive a car for 6 weeks or until released by your surgeon.  Do not drive while taking narcotics.  Wear elastic stockings for two weeks following surgery during the day but you may remove then at night.  Make sure you keep all of your appointments after your operation with all of your doctors and caregivers. You should call the office at the above phone number and make an appointment for approximately two weeks after the date of your surgery. Please pick up a stool softener and laxative for home use as long as you are requiring pain medications.  ICE to the affected hip every three hours for 30 minutes at a time and then as needed for pain and swelling. Continue to use ice on the hip for pain and swelling from surgery. You may notice swelling that will progress down to the foot and ankle.  This is normal after surgery.  Elevate the leg when you are not up walking on it.   It is important for you to complete the blood thinner medication as prescribed by your doctor.  Continue to use the breathing machine which will help keep your temperature down.  It is common for your temperature to cycle up and down following surgery, especially at night when you are not up moving around and exerting yourself.  The breathing machine keeps your lungs expanded and your temperature down.  RANGE OF MOTION AND STRENGTHENING EXERCISES  These exercises   are designed to help you keep full movement of your hip joint. Follow your caregiver's or physical therapist's instructions. Perform all exercises about fifteen times, three times per day or as directed. Exercise both hips, even if you have had only one joint replacement. These exercises can be done on a training (exercise) mat, on the floor, on a table or on a bed. Use whatever works the best and is most comfortable for you. Use music or television while you are exercising so that the exercises  are a pleasant break in your day. This will make your life better with the exercises acting as a break in routine you can look forward to.  Lying on your back, slowly slide your foot toward your buttocks, raising your knee up off the floor. Then slowly slide your foot back down until your leg is straight again.  Lying on your back spread your legs as far apart as you can without causing discomfort.  Lying on your side, raise your upper leg and foot straight up from the floor as far as is comfortable. Slowly lower the leg and repeat.  Lying on your back, tighten up the muscle in the front of your thigh (quadriceps muscles). You can do this by keeping your leg straight and trying to raise your heel off the floor. This helps strengthen the largest muscle supporting your knee.  Lying on your back, tighten up the muscles of your buttocks both with the legs straight and with the knee bent at a comfortable angle while keeping your heel on the floor.   SKILLED REHAB INSTRUCTIONS: If the patient is transferred to a skilled rehab facility following release from the hospital, a list of the current medications will be sent to the facility for the patient to continue.  When discharged from the skilled rehab facility, please have the facility set up the patient's Home Health Physical Therapy prior to being released. Also, the skilled facility will be responsible for providing the patient with their medications at time of release from the facility to include their pain medication and their blood thinner medication. If the patient is still at the rehab facility at time of the two week follow up appointment, the skilled rehab facility will also need to assist the patient in arranging follow up appointment in our office and any transportation needs.  MAKE SURE YOU:  Understand these instructions.  Will watch your condition.  Will get help right away if you are not doing well or get worse.  Pick up stool softner and  laxative for home use following surgery while on pain medications. Do not remove your dressing. The dressing is waterproof--it is OK to take showers. Continue to use ice for pain and swelling after surgery. Do not use any lotions or creams on the incision until instructed by your surgeon. Total Hip Protocol.    Information on my medicine - ELIQUIS (apixaban)  This medication education was reviewed with me or my healthcare representative as part of my discharge preparation.    Why was Eliquis prescribed for you? Eliquis was prescribed for you to reduce the risk of blood clots forming after orthopedic surgery.    What do You need to know about Eliquis? Take your Eliquis TWICE DAILY - one tablet in the morning and one tablet in the evening with or without food.  It would be best to take the dose about the same time each day.  If you have difficulty swallowing the tablet whole please discuss with   your pharmacist how to take the medication safely.  Take Eliquis exactly as prescribed by your doctor and DO NOT stop taking Eliquis without talking to the doctor who prescribed the medication.  Stopping without other medication to take the place of Eliquis may increase your risk of developing a clot.  After discharge, you should have regular check-up appointments with your healthcare provider that is prescribing your Eliquis.  What do you do if you miss a dose? If a dose of ELIQUIS is not taken at the scheduled time, take it as soon as possible on the same day and twice-daily administration should be resumed.  The dose should not be doubled to make up for a missed dose.  Do not take more than one tablet of ELIQUIS at the same time.  Important Safety Information A possible side effect of Eliquis is bleeding. You should call your healthcare provider right away if you experience any of the following: ? Bleeding from an injury or your nose that does not stop. ? Unusual colored urine (red  or dark brown) or unusual colored stools (red or black). ? Unusual bruising for unknown reasons. ? A serious fall or if you hit your head (even if there is no bleeding).  Some medicines may interact with Eliquis and might increase your risk of bleeding or clotting while on Eliquis. To help avoid this, consult your healthcare provider or pharmacist prior to using any new prescription or non-prescription medications, including herbals, vitamins, non-steroidal anti-inflammatory drugs (NSAIDs) and supplements.  This website has more information on Eliquis (apixaban): http://www.eliquis.com/eliquis/home  

## 2016-03-27 NOTE — Discharge Summary (Signed)
Physician Discharge Summary  Patient ID: Danielle Gray MRN: [id number] DOB/AGE: 04-23-1950 65 y.o.  Admit date: 03/26/2016 Discharge date: 03/27/2016  Admission Diagnoses:  Primary osteoarthritis of left hip  Discharge Diagnoses:  Principal Problem:   Primary osteoarthritis of left hip Active Problems:   Osteoarthritis of left hip   Past Medical History:  Diagnosis Date  . Arthritis   . Back pain   . GERD (gastroesophageal reflux disease)   . 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 Hypertension   . Lumbar herniated disc     Surgeries: Procedure(s): LEFT TOTAL HIP ARTHROPLASTY ANTERIOR APPROACH on 03/26/2016   Consultants (if any):   Discharged Condition: Improved  Hospital Course: Danielle Gray is an 65 y.o. female who was admitted 03/26/2016 with a diagnosis of Primary osteoarthritis of left hip and went to the operating room on 03/26/2016 and underwent the above named procedures.    She was given perioperative antibiotics:  Anti-infectives    Start     Dose/Rate Route Frequency Ordered Stop   03/26/16 2000  ceFAZolin (ANCEF) IVPB 2g/100 mL premix     2 g 200 mL/hr over 30 Minutes Intravenous Every 6 hours 03/26/16 1701 03/27/16 0251   03/26/16 1046  ceFAZolin (ANCEF) IVPB 2g/100 mL premix     2 g 200 mL/hr over 30 Minutes Intravenous On call to O.R. 03/26/16 1046 03/26/16 1255    .  She was given sequential compression devices, early ambulation, and apixaban for DVT prophylaxis.  She benefited maximally from the hospital stay and there were no complications.    Recent vital signs:  Vitals:   03/27/16 0923 03/27/16 1039  BP: 120/66 (!) 142/69  Pulse: 74 72  Resp:  18  Temp:  98.6 F (37 C)    Recent laboratory studies:  Lab Results  Component Value Date   HGB 10.9 (L) 03/27/2016   HGB 13.3 03/17/2016   HGB 13.5 05/15/2015   Lab Results  Component Value Date   WBC 14.1 (H) 03/27/2016   PLT 227 03/27/2016   Lab Results   Component Value Date   INR 1.00 03/17/2016   Lab Results  Component Value Date   NA 139 03/27/2016   K 4.3 03/27/2016   CL 108 03/27/2016   CO2 26 03/27/2016   BUN 14 03/27/2016   CREATININE 0.63 03/27/2016   GLUCOSE 144 (H) 03/27/2016    Discharge Medications:     Medication List    STOP taking these medications   acetaminophen 500 MG tablet Commonly known as:  TYLENOL     TAKE these medications   apixaban 2.5 MG Tabs tablet Commonly known as:  ELIQUIS Take 1 tablet (2.5 mg total) by mouth every 12 (twelve) hours.   cholecalciferol 1000 units tablet Commonly known as:  VITAMIN D Take 1,000 Units by mouth daily.   CoQ-10 100 MG Caps Take 100 mg by mouth daily.   docusate sodium 100 MG capsule Commonly known as:  COLACE Take 1 capsule (100 mg total) by mouth 2 (two) times daily.   HYDROcodone-acetaminophen 5-325 MG tablet Commonly known as:  NORCO/VICODIN Take 1-2 tablets by mouth every 4 (four) hours as needed (breakthrough pain).   lisinopril-hydrochlorothiazide 10-12.5 MG tablet Commonly known as:  PRINZIDE,ZESTORETIC Take 1 tablet by mouth every morning.   methocarbamol 500 MG tablet Commonly known as:  ROBAXIN Take 1 tablet (500 mg total) by mouth every 6 (six) hours as needed for muscle  spasms.   ondansetron 4 MG tablet Commonly known as:  ZOFRAN Take 1 tablet (4 mg total) by mouth every 6 (six) hours as needed for nausea.   ranitidine 75 MG tablet Commonly known as:  ZANTAC Take 75 mg by mouth daily.   senna 8.6 MG Tabs tablet Commonly known as:  SENOKOT Take 2 tablets (17.2 mg total) by mouth at bedtime.            Durable Medical Equipment        Start     Ordered   03/26/16 1702  DME Walker rolling  Once    Question:  Patient needs a walker to treat with the following condition  Answer:  Status post total hip replacement, left   03/26/16 1701   03/26/16 1702  DME 3 n 1  Once     03/26/16 1701   03/26/16 1702  DME Bedside commode   Once    Question:  Patient needs a bedside commode to treat with the following condition  Answer:  Status post total hip replacement, left   03/26/16 1701      Diagnostic Studies: Dg Pelvis Portable  Result Date: 03/26/2016 CLINICAL DATA:  Hip replacement. EXAM: PORTABLE PELVIS 1-2 VIEWS COMPARISON:  Left hip clinical 11/04/2012. FINDINGS: The left hip replacement. Hardware intact. Anatomic alignment. Degenerative changes right hip. Diffuse osteopenia. IMPRESSION: Total left hip replacement.  Anatomic alignment . Electronically Signed   By: Marcello Moores  Register   On: 03/26/2016 16:01   Dg C-arm 1-60 Min-no Report  Result Date: 03/26/2016 There is no Radiologist interpretation  for this exam.  Dg Hip Operative Unilat With Pelvis Left  Result Date: 03/26/2016 CLINICAL DATA:  Left total hip replacement. EXAM: OPERATIVE left HIP (WITH PELVIS IF PERFORMED) 2 VIEWS TECHNIQUE: Fluoroscopic spot image(s) were submitted for interpretation post-operatively. FLUOROSCOPY TIME:  23 seconds. COMPARISON:  None. FINDINGS: The femoral and acetabular components appear to be well situated. No fracture or dislocation is noted. Expected postoperative changes are seen in the surrounding soft tissues. IMPRESSION: Status post left total hip arthroplasty. Electronically Signed   By: Marijo Conception, M.D.   On: 03/26/2016 15:27    Disposition: 01-Home or Self Care  Discharge Instructions    Call MD / Call 911    Complete by:  As directed    If you experience chest pain or shortness of breath, CALL 911 and be transported to the hospital emergency room.  If you develope a fever above 101 F, pus (white drainage) or increased drainage or redness at the wound, or calf pain, call your surgeon's office.   Constipation Prevention    Complete by:  As directed    Drink plenty of fluids.  Prune juice may be helpful.  You may use a stool softener, such as Colace (over the counter) 100 mg twice a day.  Use MiraLax (over the  counter) for constipation as needed.   Diet - low sodium heart healthy    Complete by:  As directed    Driving restrictions    Complete by:  As directed    No driving for 6 weeks   Increase activity slowly as tolerated    Complete by:  As directed    Lifting restrictions    Complete by:  As directed    No lifting for 6 weeks   TED hose    Complete by:  As directed    Use stockings (TED hose) for 2 weeks on both leg(s).  You may remove them at night for sleeping.      Follow-up Information    Allie Gerhold, Horald Pollen, MD. Schedule an appointment as soon as possible for a visit in 2 week(s).   Specialty:  Orthopedic Surgery Why:  For wound re-check Contact information: [address] [place] [address] [phone]        [facility] Follow up.   Why:  physical therapy Contact information: [place] [address] [phone]            Signed: Elie Goody 03/27/2016, 1:43 PM

## 2016-03-27 NOTE — Progress Notes (Signed)
[facility]  Patient Status: New  [facility] is providing the following services: PT   Spoke with patient at bedside and she declined the ordered DME, states she has access to all equipment that was ordered.    Saunders Glance 03/27/2016, 12:47 PM

## 2016-10-14 ENCOUNTER — Ambulatory Visit: Payer: Self-pay | Admitting: Orthopedic Surgery

## 2016-11-11 ENCOUNTER — Ambulatory Visit: Payer: Self-pay | Admitting: Orthopedic Surgery

## 2016-11-11 NOTE — H&P (Signed)
TOTAL HIP ADMISSION H&P  Patient is admitted for right total hip arthroplasty.  Subjective:  Chief Complaint: right hip pain  HPI: Danielle Gray, 66 y.o. female, has a history of pain and functional disability in the right hip(s) due to arthritis and patient has failed non-surgical conservative treatments for greater than 12 weeks to include NSAID's and/or analgesics, corticosteriod injections, flexibility and strengthening excercises, supervised PT with diminished ADL's post treatment, use of assistive devices, weight reduction as appropriate and activity modification.  Onset of symptoms was gradual starting 2 years ago with rapidlly worsening course since that time.The patient noted no past surgery on the left hip(s).  Patient currently rates pain in the right hip at 10 out of 10 with activity. Patient has night pain, worsening of pain with activity and weight bearing, trendelenberg gait, pain that interfers with activities of daily living, pain with passive range of motion and crepitus. Patient has evidence of subchondral cysts, subchondral sclerosis, periarticular osteophytes and joint space narrowing by imaging studies. This condition presents safety issues increasing the risk of falls. There is no current active infection.  Patient Active Problem List   Diagnosis Date Noted  . Primary osteoarthritis of left hip 03/26/2016  . Osteoarthritis of left hip 03/26/2016  . Spinal stenosis, lumbar region, with neurogenic claudication 05/22/2015   Past Medical History:  Diagnosis Date  . Arthritis   . Back pain   . GERD (gastroesophageal reflux disease)   . 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 Hypertension   . Lumbar herniated disc     Past Surgical History:  Procedure Laterality Date  . BLADDER TACK  25 YRS AGO  . BREAST SURGERY     biopsy   . colonscopy     . JOINT REPLACEMENT  2006   RT TOTAL KNEE  . LUMBAR LAMINECTOMY/DECOMPRESSION MICRODISCECTOMY Left  05/22/2015   Procedure: CENTRAL DECOMPRESSION LUMBAR LAMINECTOMY L3-4 AND L4-5, 2 LEVEL SPINAL STENOSIS WITH MICRODISCECTOMY L3-4 ON LEFT AND FORAMINOTOMY L3-4 AND L4-5 LEFT;  Surgeon: Latanya Maudlin, MD;  Location: [facility];  Service: Orthopedics;  Laterality: Left;  . TOTAL HIP ARTHROPLASTY Left 03/26/2016   Procedure: LEFT TOTAL HIP ARTHROPLASTY ANTERIOR APPROACH;  Surgeon: Rod Can, MD;  Location: [facility];  Service: Orthopedics;  Laterality: Left;  [place] RNFA  . TUBAL LIGATION    . UPPER GI ENDOSCOPY       (Not in a hospital admission) No Known Allergies  Social History  Substance Use Topics  . Smoking status: Never Smoker  . Smokeless tobacco: Never Used  . Alcohol use Yes     Comment: OCCASIONAL    No family history on file.   Review of Systems  Constitutional: Negative.   HENT: Negative.   Eyes: Negative.   Respiratory: Negative.   Cardiovascular: Negative.   Gastrointestinal: Negative.   Genitourinary: Negative.   Musculoskeletal: Positive for joint pain.  Skin: Negative.   Neurological: Negative.   Endo/Heme/Allergies: Negative.   Psychiatric/Behavioral: Negative.     Objective:  Physical Exam  Vitals reviewed. Constitutional: She is oriented to person, place, and time. She appears well-developed and well-nourished.  HENT:  Head: Normocephalic and atraumatic.  Eyes: Pupils are equal, round, and reactive to light. Conjunctivae and EOM are normal.  Neck: Normal range of motion. Neck supple.  Cardiovascular: Normal rate, regular rhythm and intact distal pulses.   Respiratory: Effort normal. No respiratory distress.  GI: Soft. She exhibits no distension.  Genitourinary:  Genitourinary Comments: deferred  Musculoskeletal:       Right hip: She exhibits decreased range of motion and bony tenderness.  Neurological: She is alert and oriented to person, place, and time. She has normal reflexes.  Skin: Skin is warm and dry.  Psychiatric: She has a normal mood and  affect. Her behavior is normal. Judgment and thought content normal.    Vital signs in last 24 hours: @VSRANGES @  Labs:   Estimated body mass index is 36.94 kg/m as calculated from the following:   Height as of 03/26/16: 5\' 5"  (1.651 m).   Weight as of 03/26/16: 100.7 kg (222 lb).   Imaging Review Plain radiographs demonstrate severe degenerative joint disease of the right hip(s). The bone quality appears to be adequate for age and reported activity level.  Assessment/Plan:  End stage arthritis, right hip(s)  The patient history, physical examination, clinical judgement of the provider and imaging studies are consistent with end stage degenerative joint disease of the right hip(s) and total hip arthroplasty is deemed medically necessary. The treatment options including medical management, injection therapy, arthroscopy and arthroplasty were discussed at length. The risks and benefits of total hip arthroplasty were presented and reviewed. The risks due to aseptic loosening, infection, stiffness, dislocation/subluxation,  thromboembolic complications and other imponderables were discussed.  The patient acknowledged the explanation, agreed to proceed with the plan and consent was signed. Patient is being admitted for inpatient treatment for surgery, pain control, PT, OT, prophylactic antibiotics, VTE prophylaxis, progressive ambulation and ADL's and discharge planning.The patient is planning to be discharged home with HEP

## 2016-11-20 NOTE — Patient Instructions (Addendum)
Danielle Gray  11/20/2016   Your procedure is scheduled on: 12-03-16   Report to [facility]  elevators to 3rd floor to  [place] at 10:45 AM.    Call this number if you have problems the morning of surgery [phone]    Remember: ONLY 1 PERSON MAY GO WITH YOU TO SHORT STAY TO GET  READY MORNING OF [place].  Do not eat food or drink liquids :After Midnight.     Take these medicines the morning of surgery with A SIP OF WATER: None                                You may not have any metal on your body including hair pins and              piercings  Do not wear jewelry, make-up, lotions, powders or perfumes, deodorant             Do not wear nail polish.  Do not shave  48 hours prior to surgery.              Men may shave face and neck.   Do not bring valuables to the hospital. [place].  Contacts, dentures or bridgework may not be worn into surgery.  Leave suitcase in the car. After surgery it may be brought to your room.                  Please read over the following fact sheets you were given: _____________________________________________________________________             [facility] - Preparing for Surgery Before surgery, you can play an important role.  Because skin is not sterile, your skin needs to be as free of germs as possible.  You can reduce the number of germs on your skin by washing with CHG (chlorahexidine gluconate) soap before surgery.  CHG is an antiseptic cleaner which kills germs and bonds with the skin to continue killing germs even after washing. Please DO NOT use if you have an allergy to CHG or antibacterial soaps.  If your skin becomes reddened/irritated stop using the CHG and inform your nurse when you arrive at Short Stay. Do not shave (including legs and underarms) for at least 48 hours prior to the first CHG shower.  You may shave your  face/neck. Please follow these instructions carefully:  1.  Shower with CHG Soap the night before surgery and the  morning of Surgery.  2.  If you choose to wash your hair, wash your hair first as usual with your  normal  shampoo.  3.  After you shampoo, rinse your hair and body thoroughly to remove the  shampoo.                           4.  Use CHG as you would any other liquid soap.  You can apply chg directly  to the skin and wash                       Gently with a scrungie or clean washcloth.  5.  Apply  the CHG Soap to your body ONLY FROM THE NECK DOWN.   Do not use on face/ open                           Wound or open sores. Avoid contact with eyes, ears mouth and genitals (private parts).                       Wash face,  Genitals (private parts) with your normal soap.             6.  Wash thoroughly, paying special attention to the area where your surgery  will be performed.  7.  Thoroughly rinse your body with warm water from the neck down.  8.  DO NOT shower/wash with your normal soap after using and rinsing off  the CHG Soap.                9.  Pat yourself dry with a clean towel.            10.  Wear clean pajamas.            11.  Place clean sheets on your bed the night of your first shower and do not  sleep with pets. Day of Surgery : Do not apply any lotions/deodorants the morning of surgery.  Please wear clean clothes to the hospital/surgery center.  FAILURE TO FOLLOW THESE INSTRUCTIONS MAY RESULT IN THE CANCELLATION OF YOUR SURGERY PATIENT SIGNATURE_________________________________  NURSE SIGNATURE__________________________________  ________________________________________________________________________   Adam Phenix  An incentive spirometer is a tool that can help keep your lungs clear and active. This tool measures how well you are filling your lungs with each breath. Taking long deep breaths may help reverse or decrease the chance of developing breathing  (pulmonary) problems (especially infection) following:  A long period of time when you are unable to move or be active. BEFORE THE PROCEDURE   If the spirometer includes an indicator to show your best effort, your nurse or respiratory therapist will set it to a desired goal.  If possible, sit up straight or lean slightly forward. Try not to slouch.  Hold the incentive spirometer in an upright position. INSTRUCTIONS FOR USE  1. Sit on the edge of your bed if possible, or sit up as far as you can in bed or on a chair. 2. Hold the incentive spirometer in an upright position. 3. Breathe out normally. 4. Place the mouthpiece in your mouth and seal your lips tightly around it. 5. Breathe in slowly and as deeply as possible, raising the piston or the ball toward the top of the column. 6. Hold your breath for 3-5 seconds or for as long as possible. Allow the piston or ball to fall to the bottom of the column. 7. Remove the mouthpiece from your mouth and breathe out normally. 8. Rest for a few seconds and repeat Steps 1 through 7 at least 10 times every 1-2 hours when you are awake. Take your time and take a few normal breaths between deep breaths. 9. The spirometer may include an indicator to show your best effort. Use the indicator as a goal to work toward during each repetition. 10. After each set of 10 deep breaths, practice coughing to be sure your lungs are clear. If you have an incision (the cut made at the time of surgery), support your incision when coughing by placing a pillow or rolled  up towels firmly against it. Once you are able to get out of bed, walk around indoors and cough well. You may stop using the incentive spirometer when instructed by your caregiver.  RISKS AND COMPLICATIONS  Take your time so you do not get dizzy or light-headed.  If you are in pain, you may need to take or ask for pain medication before doing incentive spirometry. It is harder to take a deep breath if you  are having pain. AFTER USE  Rest and breathe slowly and easily.  It can be helpful to keep track of a log of your progress. Your caregiver can provide you with a simple table to help with this. If you are using the spirometer at home, follow these instructions: [place] IF:   You are having difficultly using the spirometer.  You have trouble using the spirometer as often as instructed.  Your pain medication is not giving enough relief while using the spirometer.  You develop fever of 100.5 F (38.1 C) or higher. SEEK IMMEDIATE MEDICAL CARE IF:   You cough up bloody sputum that had not been present before.  You develop fever of 102 F (38.9 C) or greater.  You develop worsening pain at or near the incision site. MAKE SURE YOU:   Understand these instructions.  Will watch your condition.  Will get help right away if you are not doing well or get worse. Document Released: 08/17/2006 Document Revised: 06/29/2011 Document Reviewed: 10/18/2006 Exit[employer] Patient Information 2014 Exit[employer], [place].   ________________________________________________________________________  WHAT IS A BLOOD TRANSFUSION? Blood Transfusion Information  A transfusion is the replacement of blood or some of its parts. Blood is made up of multiple cells which provide different functions.  Red blood cells carry oxygen and are used for blood loss replacement.  White blood cells fight against infection.  Platelets control bleeding.  Plasma helps clot blood.  Other blood products are available for specialized needs, such as hemophilia or other clotting disorders. BEFORE THE TRANSFUSION  Who gives blood for transfusions?   Healthy volunteers who are fully evaluated to make sure their blood is safe. This is blood bank blood. Transfusion therapy is the safest it has ever been in the practice of medicine. Before blood is taken from a donor, a complete history is taken to make sure that person has  no history of diseases nor engages in risky social behavior (examples are intravenous drug use or sexual activity with multiple partners). The donor's travel history is screened to minimize risk of transmitting infections, such as malaria. The donated blood is tested for signs of infectious diseases, such as HIV and hepatitis. The blood is then tested to be sure it is compatible with you in order to minimize the chance of a transfusion reaction. If you or a relative donates blood, this is often done in anticipation of surgery and is not appropriate for emergency situations. It takes many days to process the donated blood. RISKS AND COMPLICATIONS Although transfusion therapy is very safe and saves many lives, the main dangers of transfusion include:   Getting an infectious disease.  Developing a transfusion reaction. This is an allergic reaction to something in the blood you were given. Every precaution is taken to prevent this. The decision to have a blood transfusion has been considered carefully by your caregiver before blood is given. Blood is not given unless the benefits outweigh the risks. AFTER THE TRANSFUSION  Right after receiving a blood transfusion, you will usually feel  much better and more energetic. This is especially true if your red blood cells have gotten low (anemic). The transfusion raises the level of the red blood cells which carry oxygen, and this usually causes an energy increase.  The nurse administering the transfusion will monitor you carefully for complications. HOME CARE INSTRUCTIONS  No special instructions are needed after a transfusion. You may find your energy is better. Speak with your caregiver about any limitations on activity for underlying diseases you may have. SEEK MEDICAL CARE IF:   Your condition is not improving after your transfusion.  You develop redness or irritation at the intravenous (IV) site. SEEK IMMEDIATE MEDICAL CARE IF:  Any of the following  symptoms occur over the next 12 hours:  Shaking chills.  You have a temperature by mouth above 102 F (38.9 C), not controlled by medicine.  Chest, back, or muscle pain.  People around you feel you are not acting correctly or are confused.  Shortness of breath or difficulty breathing.  Dizziness and fainting.  You get a rash or develop hives.  You have a decrease in urine output.  Your urine turns a dark color or changes to pink, red, or brown. Any of the following symptoms occur over the next 10 days:  You have a temperature by mouth above 102 F (38.9 C), not controlled by medicine.  Shortness of breath.  Weakness after normal activity.  The white part of the eye turns yellow (jaundice).  You have a decrease in the amount of urine or are urinating less often.  Your urine turns a dark color or changes to pink, red, or brown. Document Released: 04/03/2000 Document Revised: 06/29/2011 Document Reviewed: 11/21/2007 [facility] Patient Information 2014 [place], [place].  _______________________________________________________________________

## 2016-11-24 NOTE — Progress Notes (Signed)
10-12-16 Surgical clearance from Dr. Quillian Quince on chart

## 2016-11-25 ENCOUNTER — Encounter (HOSPITAL_COMMUNITY)
Admission: RE | Admit: 2016-11-25 | Discharge: 2016-11-25 | Disposition: A | Discharge status: Home / Self Care | Payer: Managed Care, Other (non HMO) | Source: Ambulatory Visit | Attending: Orthopedic Surgery | Admitting: Orthopedic Surgery

## 2016-11-25 DIAGNOSIS — M1611 Unilateral primary osteoarthritis, right hip: Secondary | ICD-10-CM | POA: Insufficient documentation

## 2016-11-25 DIAGNOSIS — Z01812 Encounter for preprocedural laboratory examination: Secondary | ICD-10-CM | POA: Insufficient documentation

## 2016-11-25 LAB — CBC
HCT: 37.5 % (ref 36.0–46.0)
Hemoglobin: 12.9 g/dL (ref 12.0–15.0)
MCH: 30.3 pg (ref 26.0–34.0)
MCHC: 34.4 g/dL (ref 30.0–36.0)
MCV: 88 fL (ref 78.0–100.0)
Platelets: 246 10*3/uL (ref 150–400)
RBC: 4.26 MIL/uL (ref 3.87–5.11)
RDW: 12.5 % (ref 11.5–15.5)
WBC: 11.4 10*3/uL — ABNORMAL HIGH (ref 4.0–10.5)

## 2016-11-25 LAB — BASIC METABOLIC PANEL
Anion gap: 8 (ref 5–15)
BUN: 23 mg/dL — AB (ref 6–20)
CHLORIDE: 103 mmol/L (ref 101–111)
CO2: 28 mmol/L (ref 22–32)
CREATININE: 0.89 mg/dL (ref 0.44–1.00)
Calcium: 9.4 mg/dL (ref 8.9–10.3)
GFR calc Af Amer: >60 mL/min (ref >60)
GFR calc non Af Amer: >60 mL/min (ref >60)
Glucose, Bld: 101 mg/dL — ABNORMAL HIGH (ref 65–99)
Potassium: 3.6 mmol/L (ref 3.5–5.1)
Sodium: 139 mmol/L (ref 135–145)

## 2016-11-25 LAB — SURGICAL PCR SCREEN
MRSA, PCR: NEGATIVE
Staphylococcus aureus: POSITIVE — AB

## 2016-11-26 ENCOUNTER — Encounter (HOSPITAL_COMMUNITY): Payer: Self-pay

## 2016-11-27 NOTE — Progress Notes (Signed)
11-25-16 [place] for EKG. EKG performed, was unconfirmed. However, they did have an office noted from that visit indicating that the result was NSR. They will fax tracing and office note to our office....  11-27-16   Information not received. Contacted MD's office again, office closed. Spoke with pt. She will follow up with MD's office. If EKG not received, will obtain on day of surgery.

## 2016-12-01 NOTE — Progress Notes (Addendum)
03-06-16 EKG on chart

## 2016-12-03 ENCOUNTER — Encounter (HOSPITAL_COMMUNITY): Payer: Self-pay | Admitting: *Deleted

## 2016-12-03 ENCOUNTER — Inpatient Hospital Stay (HOSPITAL_COMMUNITY): Payer: Managed Care, Other (non HMO) | Admitting: Certified Registered Nurse Anesthetist

## 2016-12-03 ENCOUNTER — Inpatient Hospital Stay (HOSPITAL_COMMUNITY): Payer: Managed Care, Other (non HMO)

## 2016-12-03 ENCOUNTER — Encounter (HOSPITAL_COMMUNITY)
Admission: RE | Disposition: A | Discharge status: Home / Self Care | Payer: Self-pay | Source: Ambulatory Visit | Attending: Orthopedic Surgery

## 2016-12-03 ENCOUNTER — Inpatient Hospital Stay (HOSPITAL_COMMUNITY)
Admission: RE | Admit: 2016-12-03 | Discharge: 2016-12-04 | DRG: 470 | Disposition: A | Discharge status: Home / Self Care | Payer: Managed Care, Other (non HMO) | Source: Ambulatory Visit | Attending: Orthopedic Surgery | Admitting: Orthopedic Surgery

## 2016-12-03 DIAGNOSIS — Z09 Encounter for follow-up examination after completed treatment for conditions other than malignant neoplasm: Secondary | ICD-10-CM

## 2016-12-03 DIAGNOSIS — K219 Gastro-esophageal reflux disease without esophagitis: Secondary | ICD-10-CM | POA: Diagnosis present

## 2016-12-03 DIAGNOSIS — Z419 Encounter for procedure for purposes other than remedying health state, unspecified: Secondary | ICD-10-CM

## 2016-12-03 DIAGNOSIS — M25551 Pain in right hip: Secondary | ICD-10-CM | POA: Diagnosis present

## 2016-12-03 DIAGNOSIS — M1611 Unilateral primary osteoarthritis, right hip: Secondary | ICD-10-CM | POA: Diagnosis present

## 2016-12-03 DIAGNOSIS — Z96642 Presence of left artificial hip joint: Secondary | ICD-10-CM | POA: Diagnosis present

## 2016-12-03 DIAGNOSIS — I10 Essential (primary) hypertension: Secondary | ICD-10-CM | POA: Diagnosis present

## 2016-12-03 DIAGNOSIS — Z86718 Personal history of other venous thrombosis and embolism: Secondary | ICD-10-CM | POA: Diagnosis not present

## 2016-12-03 HISTORY — PX: TOTAL HIP ARTHROPLASTY: SHX124

## 2016-12-03 LAB — TYPE AND SCREEN
ABO/RH(D): O POS
ANTIBODY SCREEN: NEGATIVE

## 2016-12-03 SURGERY — ARTHROPLASTY, HIP, TOTAL, ANTERIOR APPROACH
Anesthesia: Monitor Anesthesia Care | Site: Hip | Laterality: Right

## 2016-12-03 MED ORDER — METHOCARBAMOL 1000 MG/10ML IJ SOLN
500.0000 mg | Freq: Four times a day (QID) | INTRAVENOUS | Status: DC | PRN
  Administered 2016-12-03: 500 mg via INTRAVENOUS
  Filled 2016-12-03: qty 5

## 2016-12-03 MED ORDER — PROPOFOL 10 MG/ML IV BOLUS
INTRAVENOUS | Status: AC
  Filled 2016-12-03: qty 40

## 2016-12-03 MED ORDER — DEXAMETHASONE SODIUM PHOSPHATE 10 MG/ML IJ SOLN
INTRAMUSCULAR | Status: AC
  Filled 2016-12-03: qty 1

## 2016-12-03 MED ORDER — HYDROCHLOROTHIAZIDE 12.5 MG PO CAPS
12.5000 mg | ORAL_CAPSULE | Freq: Every day | ORAL | Status: DC
  Filled 2016-12-03: qty 1

## 2016-12-03 MED ORDER — ACETAMINOPHEN 325 MG PO TABS: 650.0000 mg | ORAL_TABLET | Freq: Four times a day (QID) | ORAL | Status: DC | PRN

## 2016-12-03 MED ORDER — METHOCARBAMOL 500 MG PO TABS
500.0000 mg | ORAL_TABLET | Freq: Four times a day (QID) | ORAL | Status: DC | PRN
  Administered 2016-12-04: 06:00:00 500 mg via ORAL
  Filled 2016-12-03: qty 1

## 2016-12-03 MED ORDER — DIPHENHYDRAMINE HCL 12.5 MG/5ML PO ELIX: 12.5000 mg | ORAL_SOLUTION | ORAL | Status: DC | PRN

## 2016-12-03 MED ORDER — FENTANYL CITRATE (PF) 100 MCG/2ML IJ SOLN: INTRAMUSCULAR | Status: DC | PRN

## 2016-12-03 MED ORDER — MIDAZOLAM HCL 5 MG/5ML IJ SOLN
INTRAMUSCULAR | Status: DC | PRN
  Administered 2016-12-03: 2 mg via INTRAVENOUS

## 2016-12-03 MED ORDER — PHENOL 1.4 % MT LIQD: 1.0000 | OROMUCOSAL | Status: DC | PRN

## 2016-12-03 MED ORDER — SUGAMMADEX SODIUM 200 MG/2ML IV SOLN
INTRAVENOUS | Status: AC
  Filled 2016-12-03: qty 2

## 2016-12-03 MED ORDER — DOCUSATE SODIUM 100 MG PO CAPS
100.0000 mg | ORAL_CAPSULE | Freq: Two times a day (BID) | ORAL | Status: DC
  Administered 2016-12-03 – 2016-12-04 (×2): 100 mg via ORAL
  Filled 2016-12-03 (×2): qty 1

## 2016-12-03 MED ORDER — CHLORHEXIDINE GLUCONATE 4 % EX LIQD: 60.0000 mL | Freq: Once | CUTANEOUS | Status: DC

## 2016-12-03 MED ORDER — OXYCODONE HCL 5 MG PO TABS: 5.0000 mg | ORAL_TABLET | Freq: Once | ORAL | Status: DC | PRN

## 2016-12-03 MED ORDER — ISOPROPYL ALCOHOL 70 % SOLN
Status: AC
  Filled 2016-12-03: qty 480

## 2016-12-03 MED ORDER — POVIDONE-IODINE 10 % EX SWAB: 2.0000 "application " | Freq: Once | CUTANEOUS | Status: DC

## 2016-12-03 MED ORDER — HYDROCODONE-ACETAMINOPHEN 5-325 MG PO TABS
1.0000 | ORAL_TABLET | ORAL | Status: DC | PRN
  Administered 2016-12-03 (×2): 1 via ORAL
  Administered 2016-12-04: 2 via ORAL
  Administered 2016-12-04: 13:00:00 1 via ORAL
  Filled 2016-12-03: qty 1
  Filled 2016-12-03: qty 2
  Filled 2016-12-03 (×2): qty 1

## 2016-12-03 MED ORDER — ONDANSETRON HCL 4 MG PO TABS: 4.0000 mg | ORAL_TABLET | Freq: Four times a day (QID) | ORAL | Status: DC | PRN

## 2016-12-03 MED ORDER — FENTANYL CITRATE (PF) 100 MCG/2ML IJ SOLN
INTRAMUSCULAR | Status: AC
  Filled 2016-12-03: qty 2

## 2016-12-03 MED ORDER — TRANEXAMIC ACID 1000 MG/10ML IV SOLN
1000.0000 mg | INTRAVENOUS | Status: AC
  Administered 2016-12-03: 1000 mg via INTRAVENOUS
  Filled 2016-12-03: qty 1100

## 2016-12-03 MED ORDER — ONDANSETRON HCL 4 MG/2ML IJ SOLN
INTRAMUSCULAR | Status: DC | PRN
  Administered 2016-12-03: 4 mg via INTRAVENOUS

## 2016-12-03 MED ORDER — HYDROMORPHONE HCL 2 MG/ML IJ SOLN
INTRAMUSCULAR | Status: AC
  Filled 2016-12-03: qty 1

## 2016-12-03 MED ORDER — LISINOPRIL 10 MG PO TABS
10.0000 mg | ORAL_TABLET | Freq: Every day | ORAL | Status: DC
  Filled 2016-12-03: qty 1

## 2016-12-03 MED ORDER — METOCLOPRAMIDE HCL 5 MG PO TABS: 5.0000 mg | ORAL_TABLET | Freq: Three times a day (TID) | ORAL | Status: DC | PRN

## 2016-12-03 MED ORDER — SUGAMMADEX SODIUM 200 MG/2ML IV SOLN
INTRAVENOUS | Status: DC | PRN
  Administered 2016-12-03: 200 mg via INTRAVENOUS

## 2016-12-03 MED ORDER — PROPOFOL 10 MG/ML IV BOLUS
INTRAVENOUS | Status: DC | PRN
  Administered 2016-12-03: 130 mg via INTRAVENOUS
  Administered 2016-12-03: 20 mg via INTRAVENOUS
  Administered 2016-12-03: 30 mg via INTRAVENOUS
  Administered 2016-12-03: 20 mg via INTRAVENOUS
  Administered 2016-12-03: 30 mg via INTRAVENOUS

## 2016-12-03 MED ORDER — BUPIVACAINE-EPINEPHRINE (PF) 0.25% -1:200000 IJ SOLN
INTRAMUSCULAR | Status: AC
  Filled 2016-12-03: qty 30

## 2016-12-03 MED ORDER — HYDROMORPHONE HCL-NACL 0.5-0.9 MG/ML-% IV SOSY
PREFILLED_SYRINGE | INTRAVENOUS | Status: AC
  Filled 2016-12-03: qty 3

## 2016-12-03 MED ORDER — SENNA 8.6 MG PO TABS
2.0000 | ORAL_TABLET | Freq: Every day | ORAL | Status: DC
  Administered 2016-12-03: 17.2 mg via ORAL
  Filled 2016-12-03: qty 2

## 2016-12-03 MED ORDER — ACETAMINOPHEN 650 MG RE SUPP: 650.0000 mg | Freq: Four times a day (QID) | RECTAL | Status: DC | PRN

## 2016-12-03 MED ORDER — METOCLOPRAMIDE HCL 5 MG/ML IJ SOLN
5.0000 mg | Freq: Three times a day (TID) | INTRAMUSCULAR | Status: DC | PRN
  Administered 2016-12-03: 10 mg via INTRAVENOUS
  Filled 2016-12-03: qty 2

## 2016-12-03 MED ORDER — APIXABAN 2.5 MG PO TABS
2.5000 mg | ORAL_TABLET | Freq: Two times a day (BID) | ORAL | Status: DC
  Administered 2016-12-04: 2.5 mg via ORAL
  Filled 2016-12-03: qty 1

## 2016-12-03 MED ORDER — SENNOSIDES-DOCUSATE SODIUM 8.6-50 MG PO TABS
1.0000 | ORAL_TABLET | Freq: Every evening | ORAL | Status: DC | PRN
  Administered 2016-12-04: 1 via ORAL
  Filled 2016-12-03: qty 1

## 2016-12-03 MED ORDER — ONDANSETRON HCL 4 MG/2ML IJ SOLN: 4.0000 mg | Freq: Four times a day (QID) | INTRAMUSCULAR | Status: DC | PRN

## 2016-12-03 MED ORDER — BUPIVACAINE-EPINEPHRINE 0.25% -1:200000 IJ SOLN
INTRAMUSCULAR | Status: DC | PRN
  Administered 2016-12-03: 30 mL

## 2016-12-03 MED ORDER — SODIUM CHLORIDE 0.9 % IR SOLN
Status: DC | PRN
  Administered 2016-12-03: 3000 mL
  Administered 2016-12-03: 1000 mL

## 2016-12-03 MED ORDER — KETOROLAC TROMETHAMINE 30 MG/ML IJ SOLN
INTRAMUSCULAR | Status: AC
  Filled 2016-12-03: qty 1

## 2016-12-03 MED ORDER — KETOROLAC TROMETHAMINE 30 MG/ML IJ SOLN
INTRAMUSCULAR | Status: DC | PRN
  Administered 2016-12-03: 30 mg via INTRAVENOUS

## 2016-12-03 MED ORDER — MIDAZOLAM HCL 2 MG/2ML IJ SOLN
INTRAMUSCULAR | Status: AC
  Filled 2016-12-03: qty 2

## 2016-12-03 MED ORDER — OXYCODONE HCL 5 MG/5ML PO SOLN: 5.0000 mg | Freq: Once | ORAL | Status: DC | PRN

## 2016-12-03 MED ORDER — DEXAMETHASONE SODIUM PHOSPHATE 10 MG/ML IJ SOLN
10.0000 mg | Freq: Once | INTRAMUSCULAR | Status: AC
  Administered 2016-12-04: 10 mg via INTRAVENOUS
  Filled 2016-12-03: qty 1

## 2016-12-03 MED ORDER — FENTANYL CITRATE (PF) 250 MCG/5ML IJ SOLN
INTRAMUSCULAR | Status: AC
Start: 2016-12-03 — End: 2016-12-03
  Filled 2016-12-03: qty 5

## 2016-12-03 MED ORDER — ROCURONIUM BROMIDE 50 MG/5ML IV SOSY
PREFILLED_SYRINGE | INTRAVENOUS | Status: DC | PRN
  Administered 2016-12-03: 30 mg via INTRAVENOUS

## 2016-12-03 MED ORDER — SUCCINYLCHOLINE CHLORIDE 200 MG/10ML IV SOSY
PREFILLED_SYRINGE | INTRAVENOUS | Status: DC | PRN
  Administered 2016-12-03: 80 mg via INTRAVENOUS

## 2016-12-03 MED ORDER — CEFAZOLIN SODIUM-DEXTROSE 2-4 GM/100ML-% IV SOLN
2.0000 g | INTRAVENOUS | Status: AC
  Administered 2016-12-03: 2 g via INTRAVENOUS
  Filled 2016-12-03: qty 100

## 2016-12-03 MED ORDER — ONDANSETRON HCL 4 MG/2ML IJ SOLN
INTRAMUSCULAR | Status: AC
  Filled 2016-12-03: qty 2

## 2016-12-03 MED ORDER — HYDROMORPHONE HCL-NACL 0.5-0.9 MG/ML-% IV SOSY: 0.5000 mg | PREFILLED_SYRINGE | INTRAVENOUS | Status: DC | PRN

## 2016-12-03 MED ORDER — LACTATED RINGERS IV SOLN
INTRAVENOUS | Status: DC
  Administered 2016-12-03: 1000 mL via INTRAVENOUS
  Administered 2016-12-03 (×2): via INTRAVENOUS

## 2016-12-03 MED ORDER — SODIUM CHLORIDE 0.9 % IV SOLN
INTRAVENOUS | Status: DC
Start: 2016-12-03 — End: 2016-12-03

## 2016-12-03 MED ORDER — TRANEXAMIC ACID 1000 MG/10ML IV SOLN
1000.0000 mg | Freq: Once | INTRAVENOUS | Status: AC
  Administered 2016-12-03: 19:00:00 1000 mg via INTRAVENOUS
  Filled 2016-12-03: qty 1100

## 2016-12-03 MED ORDER — FENTANYL CITRATE (PF) 100 MCG/2ML IJ SOLN
INTRAMUSCULAR | Status: DC | PRN
  Administered 2016-12-03: 50 ug via INTRAVENOUS
  Administered 2016-12-03: 100 ug via INTRAVENOUS
  Administered 2016-12-03 (×4): 50 ug via INTRAVENOUS

## 2016-12-03 MED ORDER — LISINOPRIL-HYDROCHLOROTHIAZIDE 10-12.5 MG PO TABS: 1.0000 | ORAL_TABLET | Freq: Every morning | ORAL | Status: DC

## 2016-12-03 MED ORDER — CEFAZOLIN SODIUM-DEXTROSE 2-4 GM/100ML-% IV SOLN
2.0000 g | Freq: Four times a day (QID) | INTRAVENOUS | Status: AC
  Administered 2016-12-03 – 2016-12-04 (×2): 2 g via INTRAVENOUS
  Filled 2016-12-03 (×2): qty 100

## 2016-12-03 MED ORDER — DEXAMETHASONE SODIUM PHOSPHATE 10 MG/ML IJ SOLN
INTRAMUSCULAR | Status: DC | PRN
  Administered 2016-12-03: 10 mg via INTRAVENOUS

## 2016-12-03 MED ORDER — SODIUM CHLORIDE 0.9 % IV SOLN
INTRAVENOUS | Status: DC
  Administered 2016-12-03: 18:00:00 150 mL/h via INTRAVENOUS
  Administered 2016-12-04 (×2): via INTRAVENOUS

## 2016-12-03 MED ORDER — ACETAMINOPHEN 10 MG/ML IV SOLN
1000.0000 mg | INTRAVENOUS | Status: AC
  Administered 2016-12-03: 1000 mg via INTRAVENOUS
  Filled 2016-12-03: qty 100

## 2016-12-03 MED ORDER — HYDROMORPHONE HCL-NACL 0.5-0.9 MG/ML-% IV SOSY
0.2500 mg | PREFILLED_SYRINGE | INTRAVENOUS | Status: DC | PRN
  Administered 2016-12-03 (×3): 0.5 mg via INTRAVENOUS

## 2016-12-03 MED ORDER — SODIUM CHLORIDE 0.9 % IJ SOLN
INTRAMUSCULAR | Status: DC | PRN
  Administered 2016-12-03: 30 mL via INTRAVENOUS

## 2016-12-03 MED ORDER — MENTHOL 3 MG MT LOZG: 1.0000 | LOZENGE | OROMUCOSAL | Status: DC | PRN

## 2016-12-03 MED ORDER — SODIUM CHLORIDE 0.9 % IJ SOLN
INTRAMUSCULAR | Status: AC
  Filled 2016-12-03: qty 50

## 2016-12-03 MED ORDER — HYDROMORPHONE HCL 1 MG/ML IJ SOLN
INTRAMUSCULAR | Status: DC | PRN
  Administered 2016-12-03 (×5): .4 mg via INTRAVENOUS

## 2016-12-03 SURGICAL SUPPLY — 47 items
ADH SKN CLS APL DERMABOND .7 (GAUZE/BANDAGES/DRESSINGS) ×2
BAG DECANTER FOR FLEXI CONT (MISCELLANEOUS) IMPLANT
BAG SPEC THK2 15X12 ZIP CLS (MISCELLANEOUS)
BAG ZIPLOCK 12X15 (MISCELLANEOUS) IMPLANT
CAPT HIP TOTAL 2 ×1 IMPLANT
CHLORAPREP W/TINT 26ML (MISCELLANEOUS) ×2 IMPLANT
CLOTH BEACON ORANGE TIMEOUT ST (SAFETY) ×2 IMPLANT
COVER PERINEAL POST (MISCELLANEOUS) ×2 IMPLANT
COVER SURGICAL LIGHT HANDLE (MISCELLANEOUS) ×2 IMPLANT
DECANTER SPIKE VIAL GLASS SM (MISCELLANEOUS) ×2 IMPLANT
DERMABOND ADVANCED (GAUZE/BANDAGES/DRESSINGS) ×2
DERMABOND ADVANCED .7 DNX12 (GAUZE/BANDAGES/DRESSINGS) ×2 IMPLANT
DRAPE SHEET LG 3/4 BI-LAMINATE (DRAPES) ×6 IMPLANT
DRAPE STERI IOBAN 125X83 (DRAPES) ×2 IMPLANT
DRAPE U-SHAPE 47X51 STRL (DRAPES) ×4 IMPLANT
DRSG AQUACEL AG ADV 3.5X10 (GAUZE/BANDAGES/DRESSINGS) ×2 IMPLANT
ELECT PENCIL ROCKER SW 15FT (MISCELLANEOUS) ×2 IMPLANT
ELECT REM PT RETURN 15FT ADLT (MISCELLANEOUS) ×2 IMPLANT
GAUZE SPONGE 4X4 12PLY STRL (GAUZE/BANDAGES/DRESSINGS) ×2 IMPLANT
GLOVE BIO SURGEON STRL SZ8.5 (GLOVE) ×4 IMPLANT
GLOVE BIOGEL PI IND STRL 8.5 (GLOVE) ×1 IMPLANT
GLOVE BIOGEL PI INDICATOR 8.5 (GLOVE) ×1
GOWN SPEC L3 XXLG W/TWL (GOWN DISPOSABLE) ×2 IMPLANT
HANDPIECE INTERPULSE COAX TIP (DISPOSABLE) ×2
HOLDER FOLEY CATH W/STRAP (MISCELLANEOUS) ×2 IMPLANT
HOOD PEEL AWAY FLYTE STAYCOOL (MISCELLANEOUS) ×4 IMPLANT
MARKER SKIN DUAL TIP RULER LAB (MISCELLANEOUS) ×2 IMPLANT
NDL SPNL 18GX3.5 QUINCKE PK (NEEDLE) ×1 IMPLANT
NEEDLE SPNL 18GX3.5 QUINCKE PK (NEEDLE) ×2 IMPLANT
PACK ANTERIOR HIP CUSTOM (KITS) ×2 IMPLANT
SAW OSC TIP CART 19.5X105X1.3 (SAW) ×2 IMPLANT
SEALER BIPOLAR AQUA 6.0 (INSTRUMENTS) ×2 IMPLANT
SET HNDPC FAN SPRY TIP SCT (DISPOSABLE) ×1 IMPLANT
SUT ETHIBOND NAB CT1 #1 30IN (SUTURE) ×4 IMPLANT
SUT MNCRL AB 3-0 PS2 18 (SUTURE) ×2 IMPLANT
SUT MON AB 2-0 CT1 36 (SUTURE) ×4 IMPLANT
SUT STRATAFIX PDO 1 14 VIOLET (SUTURE) ×2
SUT STRATFX PDO 1 14 VIOLET (SUTURE) ×1
SUT VIC AB 1 CT1 27 (SUTURE) ×2
SUT VIC AB 1 CT1 27XBRD ANTBC (SUTURE) IMPLANT
SUT VIC AB 2-0 CT1 27 (SUTURE) ×2
SUT VIC AB 2-0 CT1 TAPERPNT 27 (SUTURE) ×1 IMPLANT
SUTURE STRATFX PDO 1 14 VIOLET (SUTURE) ×1 IMPLANT
SYR 50ML LL SCALE MARK (SYRINGE) ×2 IMPLANT
TRAY FOLEY W/METER SILVER 16FR (SET/KITS/TRAYS/PACK) IMPLANT
WATER STERILE IRR 1500ML POUR (IV SOLUTION) ×2 IMPLANT
YANKAUER SUCT BULB TIP 10FT TU (MISCELLANEOUS) ×2 IMPLANT

## 2016-12-03 NOTE — Anesthesia Preprocedure Evaluation (Signed)
Anesthesia Evaluation  Patient identified by MRN, date of birth, ID band Patient awake    Reviewed: Allergy & Precautions, NPO status , Patient's Chart, lab work & pertinent test results  History of Anesthesia Complications Negative for: history of anesthetic complications  Airway Mallampati: III  TM Distance: >3 FB Neck ROM: Full    Dental  (+) Teeth Intact   Pulmonary neg pulmonary ROS,    breath sounds clear to auscultation       Cardiovascular hypertension, Pt. on medications (-) angina(-) Past MI and (-) CHF  Rhythm:Regular     Neuro/Psych negative neurological ROS  negative psych ROS   GI/Hepatic Neg liver ROS, GERD  Controlled,  Endo/Other  negative endocrine ROS  Renal/GU negative Renal ROS     Musculoskeletal  (+) Arthritis ,   Abdominal   Peds  Hematology negative hematology ROS (+)   Anesthesia Other Findings   Reproductive/Obstetrics                             Anesthesia Physical Anesthesia Plan  ASA: II  Anesthesia Plan: MAC and Spinal   Post-op Pain Management:    Induction: Intravenous  PONV Risk Score and Plan: 2 and Ondansetron and Dexamethasone  Airway Management Planned: Nasal Cannula  Additional Equipment: None  Intra-op Plan:   Post-operative Plan:   Informed Consent: I have reviewed the patients History and Physical, chart, labs and discussed the procedure including the risks, benefits and alternatives for the proposed anesthesia with the patient or authorized representative who has indicated his/her understanding and acceptance.   Dental advisory given  Plan Discussed with: CRNA and Surgeon  Anesthesia Plan Comments:         Anesthesia Quick Evaluation

## 2016-12-03 NOTE — Anesthesia Procedure Notes (Signed)
Procedure Name: Intubation Date/Time: 12/03/2016 1:40 PM Performed by: Christell Faith L Pre-anesthesia Checklist: Patient identified, Emergency Drugs available, Suction available, Patient being monitored and Timeout performed Patient Re-evaluated:Patient Re-evaluated prior to induction Oxygen Delivery Method: Circle system utilized Preoxygenation: Pre-oxygenation with 100% oxygen Induction Type: IV induction Ventilation: Mask ventilation without difficulty Laryngoscope Size: Mac and 3 Grade View: Grade II Tube type: Oral Tube size: 7.5 mm Number of attempts: 1 Airway Equipment and Method: Stylet Placement Confirmation: ETT inserted through vocal cords under direct vision,  positive ETCO2,  CO2 detector and breath sounds checked- equal and bilateral Secured at: 22 cm Tube secured with: Tape Dental Injury: Teeth and Oropharynx as per pre-operative assessment

## 2016-12-03 NOTE — Op Note (Signed)
OPERATIVE REPORT  SURGEON: Rod Can, MD   ASSISTANT: Roberto Scales, RNFA.  PREOPERATIVE DIAGNOSIS: Right hip arthritis.   POSTOPERATIVE DIAGNOSIS: Right hip arthritis.   PROCEDURE: Right total hip arthroplasty, anterior approach.   IMPLANTS: DePuy Tri Lock stem, size 7, hi offset. DePuy Pinnacle Cup, size 52 mm. DePuy Altrx liner, size 32 by 52 mm, neutral. DePuy Biolox ceramic head ball, size 32 + 1 mm.  ANESTHESIA:  General and Spinal  ESTIMATED BLOOD LOSS: 350 mL.    ANTIBIOTICS: 2 g Ancef.  DRAINS: None.  COMPLICATIONS: None.   CONDITION: PACU - hemodynamically stable.   BRIEF CLINICAL NOTE: Danielle Gray is a 66 y.o. female with a long-standing history of Right hip arthritis. After failing conservative management, the patient was indicated for total hip arthroplasty. The risks, benefits, and alternatives to the procedure were explained, and the patient elected to proceed.  PROCEDURE IN DETAIL: Surgical site was marked by myself in the pre-op holding area. Once inside the operating room, spinal anesthesia was attempted but unsuccessful. General anesthesia was obtained, and a foley catheter was inserted. The patient was then positioned on the Hana table. All bony prominences were well padded. The hip was prepped and draped in the normal sterile surgical fashion. A time-out was called verifying side and site of surgery. The patient received IV antibiotics within 60 minutes of beginning the procedure.  The direct anterior approach to the hip was performed through the Hueter interval. Lateral femoral circumflex vessels were treated with the Auqumantys. The anterior capsule was exposed and an inverted T capsulotomy was made.The femoral neck cut was made to the level of the templated cut. A corkscrew was placed into the head and the head was removed. The femoral head was found to have eburnated bone. The head was passed to the back table and was  measured.  Acetabular exposure was achieved, and the pulvinar and labrum were excised. Sequential reaming of the acetabulum was then performed up to a size 51 mm reamer. A 52 mm cup was then opened and impacted into place at approximately 40 degrees of abduction and 20 degrees of anteversion. The final polyethylene liner was impacted into place and acetabular osteophytes were removed.   I then gained femoral exposure taking care to protect the abductors and greater trochanter. This was performed using standard external rotation, extension, and adduction. The capsule was peeled off the inner aspect of the greater trochanter, taking care to preserve the short external rotators. A cookie cutter was used to enter the femoral canal, and then the femoral canal finder was placed. Sequential broaching was performed up to a size 7. Calcar planer was used on the femoral neck remnant. I placed a hi offset neck and a trial head ball. The hip was reduced. Leg lengths and offset were checked fluoroscopically. The hip was dislocated and trial components were removed. The final implants were placed, and the hip was reduced.  Fluoroscopy was used to confirm component position and leg lengths. At 90 degrees of external rotation and full extension, the hip was stable to an anterior directed force.  The wound was copiously irrigated with normal saline using pulse lavage. Marcaine solution was injected into the periarticular soft tissue. The wound was closed in layers using #1 Vicryl and V-Loc for the fascia, 2-0 Vicryl for the subcutaneous fat, 2-0 Monocryl for the deep dermal layer, 3-0 running Monocryl subcuticular stitch, and Dermabond for the skin. Once the glue was fully dried, an Aquacell Ag dressing was  applied. The patient was transported to the recovery room in stable condition. Sponge, needle, and instrument counts were correct at the end of the case x2. The patient tolerated the procedure well and  there were no known complications.

## 2016-12-03 NOTE — Interval H&P Note (Signed)
History and Physical Interval Note:  12/03/2016 12:43 PM  Danielle Gray  has presented today for surgery, with the diagnosis of Degenerative joint disease right hip  The various methods of treatment have been discussed with the patient and family. After consideration of risks, benefits and other options for treatment, the patient has consented to  Procedure(s) with comments: RIGHT TOTAL HIP ARTHROPLASTY ANTERIOR APPROACH (Right) - Needs RNFA as a surgical intervention .  The patient's history has been reviewed, patient examined, no change in status, stable for surgery.  I have reviewed the patient's chart and labs.  Questions were answered to the patient's satisfaction.     [name], Horald Pollen

## 2016-12-03 NOTE — Discharge Instructions (Signed)
 Dr. Brian Swinteck Joint Replacement Specialist [facility] [address] [place], [place] [address] [phone]   TOTAL HIP REPLACEMENT POSTOPERATIVE DIRECTIONS    Hip Rehabilitation, Guidelines Following Surgery   WEIGHT BEARING Weight bearing as tolerated with assist device (walker, cane, etc) as directed, use it as long as suggested by your surgeon or therapist, typically at least 4-6 weeks.  The results of a hip operation are greatly improved after range of motion and muscle strengthening exercises. Follow all safety measures which are given to protect your hip. If any of these exercises cause increased pain or swelling in your joint, decrease the amount until you are comfortable again. Then slowly increase the exercises. Call your caregiver if you have problems or questions.   HOME CARE INSTRUCTIONS  Most of the following instructions are designed to prevent the dislocation of your new hip.  Remove items at home which could result in a fall. This includes throw rugs or furniture in walking pathways.  Continue medications as instructed at time of discharge.  You may have some home medications which will be placed on hold until you complete the course of blood thinner medication.  You may start showering once you are discharged home. Do not remove your dressing. Do not put on socks or shoes without following the instructions of your caregivers.   Sit on chairs with arms. Use the chair arms to help push yourself up when arising.  Arrange for the use of a toilet seat elevator so you are not sitting low.   Walk with walker as instructed.  You may resume a sexual relationship in one month or when given the OK by your caregiver.  Use walker as long as suggested by your caregivers.  You may put full weight on your legs and walk as much as is comfortable. Avoid periods of inactivity such as sitting longer than an hour when not asleep. This helps prevent  blood clots.  You may return to work once you are cleared by your surgeon.  Do not drive a car for 6 weeks or until released by your surgeon.  Do not drive while taking narcotics.  Wear elastic stockings for two weeks following surgery during the day but you may remove then at night.  Make sure you keep all of your appointments after your operation with all of your doctors and caregivers. You should call the office at the above phone number and make an appointment for approximately two weeks after the date of your surgery. Please pick up a stool softener and laxative for home use as long as you are requiring pain medications.  ICE to the affected hip every three hours for 30 minutes at a time and then as needed for pain and swelling. Continue to use ice on the hip for pain and swelling from surgery. You may notice swelling that will progress down to the foot and ankle.  This is normal after surgery.  Elevate the leg when you are not up walking on it.   It is important for you to complete the blood thinner medication as prescribed by your doctor.  Continue to use the breathing machine which will help keep your temperature down.  It is common for your temperature to cycle up and down following surgery, especially at night when you are not up moving around and exerting yourself.  The breathing machine keeps your lungs expanded and your temperature down.  RANGE OF MOTION AND STRENGTHENING EXERCISES  These exercises   are designed to help you keep full movement of your hip joint. Follow your caregiver's or physical therapist's instructions. Perform all exercises about fifteen times, three times per day or as directed. Exercise both hips, even if you have had only one joint replacement. These exercises can be done on a training (exercise) mat, on the floor, on a table or on a bed. Use whatever works the best and is most comfortable for you. Use music or television while you are exercising so that the exercises  are a pleasant break in your day. This will make your life better with the exercises acting as a break in routine you can look forward to.  Lying on your back, slowly slide your foot toward your buttocks, raising your knee up off the floor. Then slowly slide your foot back down until your leg is straight again.  Lying on your back spread your legs as far apart as you can without causing discomfort.  Lying on your side, raise your upper leg and foot straight up from the floor as far as is comfortable. Slowly lower the leg and repeat.  Lying on your back, tighten up the muscle in the front of your thigh (quadriceps muscles). You can do this by keeping your leg straight and trying to raise your heel off the floor. This helps strengthen the largest muscle supporting your knee.  Lying on your back, tighten up the muscles of your buttocks both with the legs straight and with the knee bent at a comfortable angle while keeping your heel on the floor.   SKILLED REHAB INSTRUCTIONS: If the patient is transferred to a skilled rehab facility following release from the hospital, a list of the current medications will be sent to the facility for the patient to continue.  When discharged from the skilled rehab facility, please have the facility set up the patient's Home Health Physical Therapy prior to being released. Also, the skilled facility will be responsible for providing the patient with their medications at time of release from the facility to include their pain medication and their blood thinner medication. If the patient is still at the rehab facility at time of the two week follow up appointment, the skilled rehab facility will also need to assist the patient in arranging follow up appointment in our office and any transportation needs.  MAKE SURE YOU:  Understand these instructions.  Will watch your condition.  Will get help right away if you are not doing well or get worse.  Pick up stool softner and  laxative for home use following surgery while on pain medications. Do not remove your dressing. The dressing is waterproof--it is OK to take showers. Continue to use ice for pain and swelling after surgery. Do not use any lotions or creams on the incision until instructed by your surgeon. Total Hip Protocol.    Information on my medicine - ELIQUIS (apixaban)  This medication education was reviewed with me or my healthcare representative as part of my discharge preparation.    Why was Eliquis prescribed for you? Eliquis was prescribed for you to reduce the risk of blood clots forming after orthopedic surgery.    What do You need to know about Eliquis? Take your Eliquis TWICE DAILY - one tablet in the morning and one tablet in the evening with or without food.  It would be best to take the dose about the same time each day.  If you have difficulty swallowing the tablet whole please discuss with   your pharmacist how to take the medication safely.  Take Eliquis exactly as prescribed by your doctor and DO NOT stop taking Eliquis without talking to the doctor who prescribed the medication.  Stopping without other medication to take the place of Eliquis may increase your risk of developing a clot.  After discharge, you should have regular check-up appointments with your healthcare provider that is prescribing your Eliquis.  What do you do if you miss a dose? If a dose of ELIQUIS is not taken at the scheduled time, take it as soon as possible on the same day and twice-daily administration should be resumed.  The dose should not be doubled to make up for a missed dose.  Do not take more than one tablet of ELIQUIS at the same time.  Important Safety Information A possible side effect of Eliquis is bleeding. You should call your healthcare provider right away if you experience any of the following: ? Bleeding from an injury or your nose that does not stop. ? Unusual colored urine (red  or dark brown) or unusual colored stools (red or black). ? Unusual bruising for unknown reasons. ? A serious fall or if you hit your head (even if there is no bleeding).  Some medicines may interact with Eliquis and might increase your risk of bleeding or clotting while on Eliquis. To help avoid this, consult your healthcare provider or pharmacist prior to using any new prescription or non-prescription medications, including herbals, vitamins, non-steroidal anti-inflammatory drugs (NSAIDs) and supplements.  This website has more information on Eliquis (apixaban): http://www.eliquis.com/eliquis/home  

## 2016-12-03 NOTE — Transfer of Care (Signed)
Immediate Anesthesia Transfer of Care Note  Patient: Danielle Gray  Procedure(s) Performed: Procedure(s) with comments: RIGHT TOTAL HIP ARTHROPLASTY ANTERIOR APPROACH (Right) - Needs RNFA  Patient Location: PACU  Anesthesia Type:General  Level of Consciousness:  sedated, patient cooperative and responds to stimulation  Airway & Oxygen Therapy:Patient Spontanous Breathing and Patient connected to face mask oxgen  Post-op Assessment:  Report given to PACU RN and Post -op Vital signs reviewed and stable  Post vital signs:  Reviewed and stable  Last Vitals:  Vitals:   12/03/16 1117 12/03/16 1609  BP: 139/61 119/72  Pulse: 83 85  Resp: 18 10  Temp: 36.6 C   SpO2: 88% 75%    Complications: No apparent anesthesia complications

## 2016-12-03 NOTE — H&P (View-Only) (Signed)
TOTAL HIP ADMISSION H&P  Patient is admitted for right total hip arthroplasty.  Subjective:  Chief Complaint: right hip pain  HPI: Danielle Gray, 66 y.o. female, has a history of pain and functional disability in the right hip(s) due to arthritis and patient has failed non-surgical conservative treatments for greater than 12 weeks to include NSAID's and/or analgesics, corticosteriod injections, flexibility and strengthening excercises, supervised PT with diminished ADL's post treatment, use of assistive devices, weight reduction as appropriate and activity modification.  Onset of symptoms was gradual starting 2 years ago with rapidlly worsening course since that time.The patient noted no past surgery on the left hip(s).  Patient currently rates pain in the right hip at 10 out of 10 with activity. Patient has night pain, worsening of pain with activity and weight bearing, trendelenberg gait, pain that interfers with activities of daily living, pain with passive range of motion and crepitus. Patient has evidence of subchondral cysts, subchondral sclerosis, periarticular osteophytes and joint space narrowing by imaging studies. This condition presents safety issues increasing the risk of falls. There is no current active infection.  Patient Active Problem List   Diagnosis Date Noted  . Primary osteoarthritis of left hip 03/26/2016  . Osteoarthritis of left hip 03/26/2016  . Spinal stenosis, lumbar region, with neurogenic claudication 05/22/2015   Past Medical History:  Diagnosis Date  . Arthritis   . Back pain   . GERD (gastroesophageal reflux disease)   . 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 Hypertension   . Lumbar herniated disc     Past Surgical History:  Procedure Laterality Date  . BLADDER TACK  25 YRS AGO  . BREAST SURGERY     biopsy   . colonscopy     . JOINT REPLACEMENT  2006   RT TOTAL KNEE  . LUMBAR LAMINECTOMY/DECOMPRESSION MICRODISCECTOMY Left  05/22/2015   Procedure: CENTRAL DECOMPRESSION LUMBAR LAMINECTOMY L3-4 AND L4-5, 2 LEVEL SPINAL STENOSIS WITH MICRODISCECTOMY L3-4 ON LEFT AND FORAMINOTOMY L3-4 AND L4-5 LEFT;  Surgeon: Latanya Maudlin, MD;  Location: [facility];  Service: Orthopedics;  Laterality: Left;  . TOTAL HIP ARTHROPLASTY Left 03/26/2016   Procedure: LEFT TOTAL HIP ARTHROPLASTY ANTERIOR APPROACH;  Surgeon: Rod Can, MD;  Location: [facility];  Service: Orthopedics;  Laterality: Left;  Nedds RNFA  . TUBAL LIGATION    . UPPER GI ENDOSCOPY       (Not in a hospital admission) No Known Allergies  Social History  Substance Use Topics  . Smoking status: Never Smoker  . Smokeless tobacco: Never Used  . Alcohol use Yes     Comment: OCCASIONAL    No family history on file.   Review of Systems  Constitutional: Negative.   HENT: Negative.   Eyes: Negative.   Respiratory: Negative.   Cardiovascular: Negative.   Gastrointestinal: Negative.   Genitourinary: Negative.   Musculoskeletal: Positive for joint pain.  Skin: Negative.   Neurological: Negative.   Endo/Heme/Allergies: Negative.   Psychiatric/Behavioral: Negative.     Objective:  Physical Exam  Vitals reviewed. Constitutional: She is oriented to person, place, and time. She appears well-developed and well-nourished.  HENT:  Head: Normocephalic and atraumatic.  Eyes: Pupils are equal, round, and reactive to light. Conjunctivae and EOM are normal.  Neck: Normal range of motion. Neck supple.  Cardiovascular: Normal rate, regular rhythm and intact distal pulses.   Respiratory: Effort normal. No respiratory distress.  GI: Soft. She exhibits no distension.  Genitourinary:  Genitourinary Comments: deferred  Musculoskeletal:       Right hip: She exhibits decreased range of motion and bony tenderness.  Neurological: She is alert and oriented to person, place, and time. She has normal reflexes.  Skin: Skin is warm and dry.  Psychiatric: She has a normal mood and  affect. Her behavior is normal. Judgment and thought content normal.    Vital signs in last 24 hours: @VSRANGES @  Labs:   Estimated body mass index is 36.94 kg/m as calculated from the following:   Height as of 03/26/16: 5\' 5"  (1.651 m).   Weight as of 03/26/16: 100.7 kg (222 lb).   Imaging Review Plain radiographs demonstrate severe degenerative joint disease of the right hip(s). The bone quality appears to be adequate for age and reported activity level.  Assessment/Plan:  End stage arthritis, right hip(s)  The patient history, physical examination, clinical judgement of the provider and imaging studies are consistent with end stage degenerative joint disease of the right hip(s) and total hip arthroplasty is deemed medically necessary. The treatment options including medical management, injection therapy, arthroscopy and arthroplasty were discussed at length. The risks and benefits of total hip arthroplasty were presented and reviewed. The risks due to aseptic loosening, infection, stiffness, dislocation/subluxation,  thromboembolic complications and other imponderables were discussed.  The patient acknowledged the explanation, agreed to proceed with the plan and consent was signed. Patient is being admitted for inpatient treatment for surgery, pain control, PT, OT, prophylactic antibiotics, VTE prophylaxis, progressive ambulation and ADL's and discharge planning.The patient is planning to be discharged home with HEP

## 2016-12-04 ENCOUNTER — Encounter (HOSPITAL_COMMUNITY): Payer: Self-pay | Admitting: Orthopedic Surgery

## 2016-12-04 LAB — CBC
HCT: 33.2 % — ABNORMAL LOW (ref 36.0–46.0)
Hemoglobin: 11.3 g/dL — ABNORMAL LOW (ref 12.0–15.0)
MCH: 30.2 pg (ref 26.0–34.0)
MCHC: 34 g/dL (ref 30.0–36.0)
MCV: 88.8 fL (ref 78.0–100.0)
PLATELETS: 243 10*3/uL (ref 150–400)
RBC: 3.74 MIL/uL — ABNORMAL LOW (ref 3.87–5.11)
RDW: 12.7 % (ref 11.5–15.5)
WBC: 16.2 10*3/uL — AB (ref 4.0–10.5)

## 2016-12-04 LAB — BASIC METABOLIC PANEL
ANION GAP: 5 (ref 5–15)
BUN: 17 mg/dL (ref 6–20)
CALCIUM: 9.2 mg/dL (ref 8.9–10.3)
CO2: 29 mmol/L (ref 22–32)
Chloride: 105 mmol/L (ref 101–111)
Creatinine, Ser: 0.69 mg/dL (ref 0.44–1.00)
GFR calc Af Amer: >60 mL/min (ref >60)
GLUCOSE: 133 mg/dL — AB (ref 65–99)
Potassium: 4.5 mmol/L (ref 3.5–5.1)
SODIUM: 139 mmol/L (ref 135–145)

## 2016-12-04 MED ORDER — APIXABAN 2.5 MG PO TABS: 2.5000 mg | ORAL_TABLET | Freq: Two times a day (BID) | ORAL | 0 refills | Status: DC

## 2016-12-04 MED ORDER — DOCUSATE SODIUM 100 MG PO CAPS: 100.0000 mg | ORAL_CAPSULE | Freq: Two times a day (BID) | ORAL | 0 refills | Status: DC

## 2016-12-04 MED ORDER — METHOCARBAMOL 500 MG PO TABS: 500.0000 mg | ORAL_TABLET | Freq: Four times a day (QID) | ORAL | 0 refills | Status: DC | PRN

## 2016-12-04 MED ORDER — SENNA 8.6 MG PO TABS: 2.0000 | ORAL_TABLET | Freq: Every day | ORAL | 0 refills | Status: DC

## 2016-12-04 MED ORDER — ONDANSETRON HCL 4 MG PO TABS: 4.0000 mg | ORAL_TABLET | Freq: Four times a day (QID) | ORAL | 0 refills | Status: DC | PRN

## 2016-12-04 MED ORDER — HYDROCODONE-ACETAMINOPHEN 5-325 MG PO TABS: 1.0000 | ORAL_TABLET | ORAL | 0 refills | Status: DC | PRN

## 2016-12-04 NOTE — Progress Notes (Signed)
Discharge planning, no HH needs identified. Plan for HEP, has DME. [phone] 

## 2016-12-04 NOTE — Progress Notes (Signed)
   Subjective:  Patient reports pain as mild.  No c/o. Denies N/V/CP/SOB. Wants to go home.  Objective:   VITALS:   Vitals:   12/03/16 2129 12/04/16 0208 12/04/16 0541 12/04/16 1014  BP: 132/62 125/65 (!) 110/59 (!) 123/46  Pulse: 74 69 72 76  Resp: 18 18 18 16   Temp: 98.5 F (36.9 C) 98 F (36.7 C) 97.9 F (36.6 C) 98.6 F (37 C)  TempSrc: Oral Oral Oral Oral  SpO2: 96% 98% 99% 98%  Weight:      Height:        NAD ABD soft Sensation intact distally Intact pulses distally Dorsiflexion/Plantar flexion intact Incision: dressing C/D/I Compartment soft   Lab Results  Component Value Date   WBC 16.2 (H) 12/04/2016   HGB 11.3 (L) 12/04/2016   HCT 33.2 (L) 12/04/2016   MCV 88.8 12/04/2016   PLT 243 12/04/2016   BMET    Component Value Date/Time   NA 139 12/04/2016 0609   K 4.5 12/04/2016 0609   CL 105 12/04/2016 0609   CO2 29 12/04/2016 0609   GLUCOSE 133 (H) 12/04/2016 0609   BUN 17 12/04/2016 0609   CREATININE 0.69 12/04/2016 0609   CALCIUM 9.2 12/04/2016 0609   GFRNONAA >60 12/04/2016 0609   GFRAA >60 12/04/2016 0609     Assessment/Plan: 1 Day Post-Op   Principal Problem:   Primary osteoarthritis of right hip Active Problems:   Osteoarthritis of right hip   WBAT with walker Apixaban, SCDs, TEDS PO pain control Pt/OT D/C home with HEP   Arvon Schreiner, Horald Pollen 12/04/2016, 11:38 AM   Rod Can, MD Cell [phone]

## 2016-12-04 NOTE — Discharge Summary (Signed)
Physician Discharge Summary  Patient ID: Danielle Gray MRN: [id number] DOB/AGE: 09/17/1950 66 y.o.  Admit date: 12/03/2016 Discharge date: 12/04/2016  Admission Diagnoses:  Primary osteoarthritis of right hip  Discharge Diagnoses:  Principal Problem:   Primary osteoarthritis of right hip Active Problems:   Osteoarthritis of right hip   Past Medical History:  Diagnosis Date  . Arthritis   . Back pain   . GERD (gastroesophageal reflux disease)   . 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 Hypertension   . Lumbar herniated disc     Surgeries: Procedure(s): RIGHT TOTAL HIP ARTHROPLASTY ANTERIOR APPROACH on 12/03/2016   Consultants (if any):   Discharged Condition: Improved  Hospital Course: Danielle Gray is an 66 y.o. female who was admitted 12/03/2016 with a diagnosis of Primary osteoarthritis of right hip and went to the operating room on 12/03/2016 and underwent the above named procedures.    She was given perioperative antibiotics:  Anti-infectives    Start     Dose/Rate Route Frequency Ordered Stop   12/03/16 2000  ceFAZolin (ANCEF) IVPB 2g/100 mL premix     2 g 200 mL/hr over 30 Minutes Intravenous Every 6 hours 12/03/16 1728 12/04/16 0213   12/03/16 1116  ceFAZolin (ANCEF) IVPB 2g/100 mL premix     2 g 200 mL/hr over 30 Minutes Intravenous On call to O.R. 12/03/16 1116 12/03/16 1412    .  She was given sequential compression devices, early ambulation, and apixaban for DVT prophylaxis.  She benefited maximally from the hospital stay and there were no complications.    Recent vital signs:  Vitals:   12/04/16 0541 12/04/16 1014  BP: (!) 110/59 (!) 123/46  Pulse: 72 76  Resp: 18 16  Temp: 97.9 F (36.6 C) 98.6 F (37 C)  SpO2: 99% 98%    Recent laboratory studies:  Lab Results  Component Value Date   HGB 11.3 (L) 12/04/2016   HGB 12.9 11/25/2016   HGB 10.9 (L) 03/27/2016   Lab Results  Component Value Date   WBC 16.2 (H)  12/04/2016   PLT 243 12/04/2016   Lab Results  Component Value Date   INR 1.00 03/17/2016   Lab Results  Component Value Date   NA 139 12/04/2016   K 4.5 12/04/2016   CL 105 12/04/2016   CO2 29 12/04/2016   BUN 17 12/04/2016   CREATININE 0.69 12/04/2016   GLUCOSE 133 (H) 12/04/2016    Discharge Medications:   Allergies as of 12/04/2016   No Known Allergies     Medication List    STOP taking these medications   acetaminophen 500 MG tablet Commonly known as:  TYLENOL   traMADol 50 MG tablet Commonly known as:  ULTRAM     TAKE these medications   apixaban 2.5 MG Tabs tablet Commonly known as:  ELIQUIS Take 1 tablet (2.5 mg total) by mouth every 12 (twelve) hours.   cholecalciferol 1000 units tablet Commonly known as:  VITAMIN D Take 1,000 Units by mouth daily.   CoQ-10 100 MG Caps Take 100 mg by mouth daily.   docusate sodium 100 MG capsule Commonly known as:  COLACE Take 1 capsule (100 mg total) by mouth 2 (two) times daily.   HYDROcodone-acetaminophen 5-325 MG tablet Commonly known as:  NORCO/VICODIN Take 1-2 tablets by mouth every 4 (four) hours as needed (breakthrough pain).   ibuprofen 800 MG tablet Commonly known as:  ADVIL,MOTRIN Take 800 mg  by mouth every 8 (eight) hours.   lisinopril-hydrochlorothiazide 10-12.5 MG tablet Commonly known as:  PRINZIDE,ZESTORETIC Take 1 tablet by mouth every morning.   methocarbamol 500 MG tablet Commonly known as:  ROBAXIN Take 1 tablet (500 mg total) by mouth every 6 (six) hours as needed for muscle spasms.   ondansetron 4 MG tablet Commonly known as:  ZOFRAN Take 1 tablet (4 mg total) by mouth every 6 (six) hours as needed for nausea.   senna 8.6 MG Tabs tablet Commonly known as:  SENOKOT Take 2 tablets (17.2 mg total) by mouth at bedtime.       Diagnostic Studies: Dg Pelvis Portable  Result Date: 12/03/2016 CLINICAL DATA:  Postop total right hip arthroplasty. EXAM: PORTABLE PELVIS 1-2 VIEWS  COMPARISON:  Radiographs 03/26/2016 FINDINGS: Well seated components of a total right hip arthroplasty without complicating features. A left hip prosthesis is stable. The pubic symphysis and SI joints are intact. No pelvic fractures. IMPRESSION: Well seated components of a total right hip arthroplasty. Electronically Signed   By: Marijo Sanes M.D.   On: 12/03/2016 16:45   Dg C-arm 61-120 Min-no Report  Result Date: 12/03/2016 Fluoroscopy was utilized by the requesting physician.  No radiographic interpretation.   Dg Hip Operative Unilat W Or W/o Pelvis Right  Result Date: 12/03/2016 CLINICAL DATA:  Status post right total hip joint prosthesis placement. EXAM: OPERATIVE right HIP (WITH PELVIS IF PERFORMED) 2 VIEWS TECHNIQUE: Fluoroscopic spot image(s) were submitted for interpretation post-operatively. 24 seconds fluoro time reported. COMPARISON:  Left hip series of March 26, 2016 which included the native right hip and portions of the pelvis. FINDINGS: The patient has undergone placement of right total hip joint prosthesis. Positioning of the prosthetic components appears good. The interface with the native bone appears normal. IMPRESSION: No immediate complication following right total hip joint prosthesis placement. Electronically Signed   By: David  Martinique M.D.   On: 12/03/2016 15:34    Disposition: 06-Home-Health Care Svc  Discharge Instructions    Call MD / Call 911    Complete by:  As directed    If you experience chest pain or shortness of breath, CALL 911 and be transported to the hospital emergency room.  If you develope a fever above 101 F, pus (white drainage) or increased drainage or redness at the wound, or calf pain, call your surgeon's office.   Constipation Prevention    Complete by:  As directed    Drink plenty of fluids.  Prune juice may be helpful.  You may use a stool softener, such as Colace (over the counter) 100 mg twice a day.  Use MiraLax (over the counter) for  constipation as needed.   Diet - low sodium heart healthy    Complete by:  As directed    Driving restrictions    Complete by:  As directed    No driving for 6 weeks   Increase activity slowly as tolerated    Complete by:  As directed    Lifting restrictions    Complete by:  As directed    No lifting for 6 weeks   TED hose    Complete by:  As directed    Use stockings (TED hose) for 2 weeks on both leg(s).  You may remove them at night for sleeping.      Follow-up Information    Anjelique Makar, Aaron Edelman, MD. Schedule an appointment as soon as possible for a visit in 2 weeks.   Specialty:  Orthopedic Surgery  Why:  For wound re-check Contact information: [address] [address] [phone]            Signed: Elie Goody 12/04/2016, 11:41 AM

## 2016-12-04 NOTE — Evaluation (Signed)
Physical Therapy Evaluation Patient Details Name: Danielle Gray MRN: [id number] DOB: 10-12-1950 Today's Date: 12/04/2016   History of Present Illness  Pt s/p R THR and with hx of recent L THR  Clinical Impression  Pt s/p R THR and presents with decreased R LE strength/ROM and post op pain limiting functional mobility.  Pt should progress to dc home with family assist.    Follow Up Recommendations Home health PT    Equipment Recommendations  None recommended by PT    Recommendations for Other Services       Precautions / Restrictions Precautions Precautions: Fall Restrictions Weight Bearing Restrictions: No Other Position/Activity Restrictions: WBAT      Mobility  Bed Mobility Overal bed mobility: Needs Assistance Bed Mobility: Supine to Sit     Supine to sit: Min assist     General bed mobility comments: cues for sequence and use of L LE to self assist  Transfers Overall transfer level: Needs assistance Equipment used: Rolling walker (2 wheeled) Transfers: Sit to/from Stand Sit to Stand: Min assist;Min guard         General transfer comment: cues for LE management and use of UEs to self assist  Ambulation/Gait Ambulation/Gait assistance: Min assist;Min guard Ambulation Distance (Feet): 147 Feet Assistive device: Rolling walker (2 wheeled) Gait Pattern/deviations: Step-to pattern;Step-through pattern;Decreased step length - right;Decreased step length - left;Shuffle;Trunk flexed Gait velocity: decr Gait velocity interpretation: Below normal speed for age/gender General Gait Details: cues for posture, position from RW and initial sequence  Stairs            Wheelchair Mobility    Modified Rankin (Stroke Patients Only)       Balance Overall balance assessment: No apparent balance deficits (not formally assessed)                                           Pertinent Vitals/Pain Pain Assessment: 0-10 Pain Score: 5  Pain  Location: R hip Pain Descriptors / Indicators: Aching;Sore Pain Intervention(s): Monitored during session;Limited activity within patient's tolerance;Premedicated before session;Ice applied    Home Living Family/patient expects to be discharged to:: Private residence Living Arrangements: Spouse/significant other Available Help at Discharge: Family;Available PRN/intermittently Type of Home: House Home Access: Stairs to enter Entrance Stairs-Rails: Right Entrance Stairs-Number of Steps: 4 Home Layout: One level Home Equipment: Walker - 2 wheels;Cane - single point;Bedside commode Additional Comments: can borrow RW, [facility]    Prior Function Level of Independence: Independent               Hand Dominance        Extremity/Trunk Assessment   Upper Extremity Assessment Upper Extremity Assessment: Overall WFL for tasks assessed    Lower Extremity Assessment Lower Extremity Assessment: RLE deficits/detail RLE Deficits / Details: Strength at hip 2+/5 with AAROM at hip to 90 flex and 15 abd    Cervical / Trunk Assessment Cervical / Trunk Assessment: Normal  Communication   Communication: No difficulties  Cognition Arousal/Alertness: Awake/alert Behavior During Therapy: WFL for tasks assessed/performed Overall Cognitive Status: Within Functional Limits for tasks assessed                                        General Comments      Exercises Total Joint Exercises Ankle Circles/Pumps:  AROM;Both;15 reps;Supine Quad Sets: AROM;Both;10 reps;Supine Heel Slides: AAROM;Right;20 reps;Supine Hip ABduction/ADduction: AAROM;Right;15 reps;Supine   Assessment/Plan    PT Assessment Patient needs continued PT services  PT Problem List Decreased strength;Decreased range of motion;Decreased activity tolerance;Decreased mobility;Decreased knowledge of use of DME;Pain       PT Treatment Interventions DME instruction;Gait training;Stair training;Functional mobility  training;Therapeutic activities;Therapeutic exercise;Patient/family education    PT Goals (Current goals can be found in the Care Plan section)  Acute Rehab PT Goals Patient Stated Goal: Regain IND and return to work PT Goal Formulation: With patient Time For Goal Achievement: 12/06/16 Potential to Achieve Goals: Good    Frequency 7X/week   Barriers to discharge        Co-evaluation               AM-PAC PT "6 Clicks" Daily Activity  Outcome Measure Difficulty turning over in bed (including adjusting bedclothes, sheets and blankets)?: Unable Difficulty moving from lying on back to sitting on the side of the bed? : A Lot Difficulty sitting down on and standing up from a chair with arms (e.g., wheelchair, bedside commode, etc,.)?: A Lot Help needed moving to and from a bed to chair (including a wheelchair)?: A Little Help needed walking in hospital room?: A Little Help needed climbing 3-5 steps with a railing? : A Little 6 Click Score: 14    End of Session Equipment Utilized During Treatment: Gait belt Activity Tolerance: Patient tolerated treatment well Patient left: in chair;with call bell/phone within reach;with family/visitor present Nurse Communication: Mobility status PT Visit Diagnosis: Difficulty in walking, not elsewhere classified (R26.2)    Time: 9518-8416 PT Time Calculation (min) (ACUTE ONLY): 29 min   Charges:   PT Evaluation $PT Eval Low Complexity: 1 Low PT Treatments $Therapeutic Exercise: 8-22 mins   PT G Codes:        Pg [phone]   Allyson Tineo 12/04/2016, 12:28 PM

## 2016-12-04 NOTE — Progress Notes (Signed)
Physical Therapy Treatment Patient Details Name: SKYLEY GRANDMAISON MRN: [id number] DOB: 04-18-1951 Today's Date: 12/04/2016    History of Present Illness Pt s/p R THR and with hx of recent L THR    PT Comments    Pt progressing well with mobility and eager for dc home.  Spouse present and reviewed car transfers, stairs and home therex program with progression and written instructions provided.   Follow Up Recommendations  DC plan and follow up therapy as arranged by surgeon     Equipment Recommendations  None recommended by PT    Recommendations for Other Services       Precautions / Restrictions Precautions Precautions: Fall Restrictions Weight Bearing Restrictions: No Other Position/Activity Restrictions: WBAT    Mobility  Bed Mobility Overal bed mobility: Needs Assistance Bed Mobility: Supine to Sit     Supine to sit: Min guard     General bed mobility comments: cues for sequence and use of L LE to self assist  Transfers Overall transfer level: Needs assistance Equipment used: Rolling walker (2 wheeled) Transfers: Sit to/from Stand Sit to Stand: Min guard;Supervision         General transfer comment: cues for LE management and use of UEs to self assist  Ambulation/Gait Ambulation/Gait assistance: Min guard;Supervision Ambulation Distance (Feet): 300 Feet Assistive device: Rolling walker (2 wheeled) Gait Pattern/deviations: Step-to pattern;Step-through pattern;Decreased step length - right;Decreased step length - left;Shuffle;Trunk flexed Gait velocity: decr Gait velocity interpretation: Below normal speed for age/gender General Gait Details: cues for posture, position from RW and initial sequence   Stairs Stairs: Yes   Stair Management: One rail Right;Step to pattern;Forwards;With cane Number of Stairs: 4 General stair comments: cues for sequence and foot/cane placement  Wheelchair Mobility    Modified Rankin (Stroke Patients Only)        Balance Overall balance assessment: No apparent balance deficits (not formally assessed)                                          Cognition Arousal/Alertness: Awake/alert Behavior During Therapy: WFL for tasks assessed/performed Overall Cognitive Status: Within Functional Limits for tasks assessed                                        Exercises Total Joint Exercises Ankle Circles/Pumps: AROM;Both;15 reps;Supine Quad Sets: AROM;Both;10 reps;Supine Heel Slides: AAROM;Right;20 reps;Supine Hip ABduction/ADduction: AAROM;Right;15 reps;Supine Long Arc Quad: AROM;Right;10 reps;Supine    General Comments        Pertinent Vitals/Pain Pain Assessment: 0-10 Pain Score: 4  Pain Location: R hip Pain Descriptors / Indicators: Aching;Sore Pain Intervention(s): Limited activity within patient's tolerance;Monitored during session;Premedicated before session    [place] expects to be discharged to:: Private residence Living Arrangements: Spouse/significant other Available Help at Discharge: Family;Available PRN/intermittently Type of Home: House Home Access: Stairs to enter Entrance Stairs-Rails: Right Home Layout: One level Home Equipment: Walker - 2 wheels;Cane - single point;Bedside commode Additional Comments: can borrow RW, [facility]    Prior Function Level of Independence: Independent          PT Goals (current goals can now be found in the care plan section) Acute Rehab PT Goals Patient Stated Goal: Regain IND and return to work PT Goal Formulation: With patient Time For Goal Achievement: 12/06/16 Potential  to Achieve Goals: Good Progress towards PT goals: Progressing toward goals    Frequency    7X/week      PT Plan Current plan remains appropriate    Co-evaluation              AM-PAC PT "6 Clicks" Daily Activity  Outcome Measure  Difficulty turning over in bed (including adjusting bedclothes, sheets and  blankets)?: A Lot Difficulty moving from lying on back to sitting on the side of the bed? : A Little Difficulty sitting down on and standing up from a chair with arms (e.g., wheelchair, bedside commode, etc,.)?: A Little Help needed moving to and from a bed to chair (including a wheelchair)?: A Little Help needed walking in hospital room?: A Little Help needed climbing 3-5 steps with a railing? : A Little 6 Click Score: 17    End of Session Equipment Utilized During Treatment: Gait belt Activity Tolerance: Patient tolerated treatment well Patient left: in chair;with call bell/phone within reach;with family/visitor present Nurse Communication: Mobility status PT Visit Diagnosis: Difficulty in walking, not elsewhere classified (R26.2)     Time: 7357-8978 PT Time Calculation (min) (ACUTE ONLY): 38 min  Charges:  $Gait Training: 8-22 mins $Therapeutic Exercise: 8-22 mins $Therapeutic Activity: 8-22 mins                    G Codes:       ER [phone]    Lyndol Vanderheiden 12/04/2016, 3:44 PM

## 2016-12-10 ENCOUNTER — Encounter: Payer: Self-pay | Admitting: *Deleted

## 2016-12-10 ENCOUNTER — Other Ambulatory Visit: Payer: Self-pay | Admitting: *Deleted

## 2016-12-10 NOTE — Patient Outreach (Signed)
[facility]) Care Management  12/10/2016  Danielle Gray Oct 14, 1950 [id number]  Subjective: Telephone call to patient's home / mobile number, spoke with patient, and HIPAA verified.  Discussed [facility] Care Management [employer] Transition of care follow up, patient voiced understanding, and is in agreement to follow up.   Patient states she is doing very well, doing home exercise program as prescribed, blood pressure medication on hold due to low blood pressures while inpatient, will resume medication per MD, will continue to monitor blood pressure as needed, has not taken blood pressure today, and is aware of signs/ symptoms to report to MD.  States she has a follow up appointment with surgeon on 12/17/16.  Patient voices understanding of medical diagnosis,surgery, and treatment plan.  States she is accessing his [employer] benefits as needed. Patient states she does not have any education material, transition of care, care coordination, disease management, disease monitoring, transportation, community resource, or pharmacy needs at this time. States this program is a very nice benefit,  she is very appreciative of the follow up and is in agreement to receive [facility] Management information.    Objective: Per Sunny Schlein and chart review, patient hospitalized 12/03/16 - 12/04/16 for Osteoarthritis of right hip.   Status post Right total hip arthroplasty, anterior approach on 12/03/16.  Patient also hospitalized 03/26/16 - 03/27/16 for Primary osteoarthritis of left hip.  Status post Left total hip arthroplasty, anterior approach on 03/26/16.  Patient has a history of hypertension, lumbar herniated disc, and DVT.     Assessment: Received [employer] Transition of care referral on 12/08/16.  Transition of care follow up completed, no care management needs, and will proceed with case closure.    Plan: RNCM will send patient successful outreach letter, [facility] pamphlet, and magnet. RNCM will send case  closure due to follow up completed / no care management needs request to [facility] [place] [facility].   Ashey Tramontana H. Annia Friendly, BSN, [place] Management [facility] Telephonic CM Phone: [phone] Fax: [phone]

## 2016-12-14 ENCOUNTER — Encounter (HOSPITAL_COMMUNITY): Payer: Self-pay | Admitting: Orthopedic Surgery

## 2016-12-14 NOTE — Anesthesia Postprocedure Evaluation (Signed)
Anesthesia Post Note  Patient: Danielle Gray  Procedure(s) Performed: Procedure(s) (LRB): RIGHT TOTAL HIP ARTHROPLASTY ANTERIOR APPROACH (Right)     Patient location during evaluation: PACU Anesthesia Type: General Level of consciousness: awake and alert Pain management: pain level controlled Vital Signs Assessment: post-procedure vital signs reviewed and stable Respiratory status: spontaneous breathing, nonlabored ventilation, respiratory function stable and patient connected to nasal cannula oxygen Cardiovascular status: blood pressure returned to baseline and stable Postop Assessment: no signs of nausea or vomiting Anesthetic complications: no    Last Vitals:  Vitals:   12/04/16 0541 12/04/16 1014  BP: (!) 110/59 (!) 123/46  Pulse: 72 76  Resp: 18 16  Temp: 36.6 C 37 C  SpO2: 99% 98%    Last Pain:  Vitals:   12/04/16 1510  TempSrc:   PainSc: 3                  Maely Clements

## 2017-02-08 ENCOUNTER — Other Ambulatory Visit (HOSPITAL_COMMUNITY): Payer: Self-pay | Admitting: Unknown Physician Specialty

## 2017-02-08 DIAGNOSIS — Z1231 Encounter for screening mammogram for malignant neoplasm of breast: Secondary | ICD-10-CM

## 2017-02-10 ENCOUNTER — Ambulatory Visit (HOSPITAL_COMMUNITY)
Admission: RE | Admit: 2017-02-10 | Discharge: 2017-02-10 | Disposition: A | Discharge status: Home / Self Care | Payer: Managed Care, Other (non HMO) | Source: Ambulatory Visit | Attending: Unknown Physician Specialty | Admitting: Unknown Physician Specialty

## 2017-02-10 DIAGNOSIS — Z1231 Encounter for screening mammogram for malignant neoplasm of breast: Secondary | ICD-10-CM | POA: Insufficient documentation

## 2017-09-21 ENCOUNTER — Ambulatory Visit: Payer: Self-pay | Admitting: Orthopedic Surgery

## 2017-10-18 ENCOUNTER — Encounter (HOSPITAL_COMMUNITY): Payer: Self-pay

## 2017-10-18 NOTE — Patient Instructions (Signed)
Your procedure is scheduled on: Thursday, October 28, 2017   Surgery Time:  7:30Am-10:00AM   Report to [facility] Main  Entrance    Report to admitting at 5:30 AM   Call this number if you have problems the morning of surgery [phone]   Do not eat food or drink liquids :After Midnight.   Do NOT smoke after Midnight   Take these medicines the morning of surgery with A SIP OF WATER: None              You may not have any metal on your body including hair pins, jewelry, and body piercings             Do not wear make-up, lotions, powders, perfumes/cologne, or deodorant             Do not wear nail polish.  Do not shave  48 hours prior to surgery.                 Do not bring valuables to the hospital. Danielle Gray.   Contacts, dentures or bridgework may not be worn into surgery.   Leave suitcase in the car. After surgery it may be brought to your room.   Special Instructions: Bring a copy of your healthcare power of attorney and living will documents         the day of surgery if you haven't scanned them in before.              Please read over the following fact sheets you were given:  [facility] - Preparing for Surgery Before surgery, you can play an important role.  Because skin is not sterile, your skin needs to be as free of germs as possible.  You can reduce the number of germs on your skin by washing with CHG (chlorahexidine gluconate) soap before surgery.  CHG is an antiseptic cleaner which kills germs and bonds with the skin to continue killing germs even after washing. Please DO NOT use if you have an allergy to CHG or antibacterial soaps.  If your skin becomes reddened/irritated stop using the CHG and inform your nurse when you arrive at Short Stay. Do not shave (including legs and underarms) for at least 48 hours prior to the first CHG shower.  You may shave your face/neck.  Please follow these instructions  carefully:  1.  Shower with CHG Soap the night before surgery and the  morning of surgery.  2.  If you choose to wash your hair, wash your hair first as usual with your normal  shampoo.  3.  After you shampoo, rinse your hair and body thoroughly to remove the shampoo.                             4.  Use CHG as you would any other liquid soap.  You can apply chg directly to the skin and wash.  Gently with a scrungie or clean washcloth.  5.  Apply the CHG Soap to your body ONLY FROM THE NECK DOWN.   Do   not use on face/ open                           Wound or open sores. Avoid contact with eyes,  ears mouth and   genitals (private parts).                       Wash face,  Genitals (private parts) with your normal soap.             6.  Wash thoroughly, paying special attention to the area where your    surgery  will be performed.  7.  Thoroughly rinse your body with warm water from the neck down.  8.  DO NOT shower/wash with your normal soap after using and rinsing off the CHG Soap.                9.  Pat yourself dry with a clean towel.            10.  Wear clean pajamas.            11.  Place clean sheets on your bed the night of your first shower and do not  sleep with pets. Day of Surgery : Do not apply any lotions/deodorants the morning of surgery.  Please wear clean clothes to the hospital/surgery center.  FAILURE TO FOLLOW THESE INSTRUCTIONS MAY RESULT IN THE CANCELLATION OF YOUR SURGERY  PATIENT SIGNATURE_________________________________  NURSE SIGNATURE__________________________________  ________________________________________________________________________   Danielle Gray  An incentive spirometer is a tool that can help keep your lungs clear and active. This tool measures how well you are filling your lungs with each breath. Taking long deep breaths may help reverse or decrease the chance of developing breathing (pulmonary) problems (especially infection) following:  A  long period of time when you are unable to move or be active. BEFORE THE PROCEDURE   If the spirometer includes an indicator to show your best effort, your nurse or respiratory therapist will set it to a desired goal.  If possible, sit up straight or lean slightly forward. Try not to slouch.  Hold the incentive spirometer in an upright position. INSTRUCTIONS FOR USE  1. Sit on the edge of your bed if possible, or sit up as far as you can in bed or on a chair. 2. Hold the incentive spirometer in an upright position. 3. Breathe out normally. 4. Place the mouthpiece in your mouth and seal your lips tightly around it. 5. Breathe in slowly and as deeply as possible, raising the piston or the ball toward the top of the column. 6. Hold your breath for 3-5 seconds or for as long as possible. Allow the piston or ball to fall to the bottom of the column. 7. Remove the mouthpiece from your mouth and breathe out normally. 8. Rest for a few seconds and repeat Steps 1 through 7 at least 10 times every 1-2 hours when you are awake. Take your time and take a few normal breaths between deep breaths. 9. The spirometer may include an indicator to show your best effort. Use the indicator as a goal to work toward during each repetition. 10. After each set of 10 deep breaths, practice coughing to be sure your lungs are clear. If you have an incision (the cut made at the time of surgery), support your incision when coughing by placing a pillow or rolled up towels firmly against it. Once you are able to get out of bed, walk around indoors and cough well. You may stop using the incentive spirometer when instructed by your caregiver.  RISKS AND COMPLICATIONS  Take your time so you do not get dizzy or light-headed.  If you are in pain, you may need to take or ask for pain medication before doing incentive spirometry. It is harder to take a deep breath if you are having pain. AFTER USE  Rest and breathe slowly and  easily.  It can be helpful to keep track of a log of your progress. Your caregiver can provide you with a simple table to help with this. If you are using the spirometer at home, follow these instructions: Danielle Gray IF:   You are having difficultly using the spirometer.  You have trouble using the spirometer as often as instructed.  Your pain medication is not giving enough relief while using the spirometer.  You develop fever of 100.5 F (38.1 C) or higher. SEEK IMMEDIATE MEDICAL CARE IF:   You cough up bloody sputum that had not been present before.  You develop fever of 102 F (38.9 C) or greater.  You develop worsening pain at or near the incision site. MAKE SURE YOU:   Understand these instructions.  Will watch your condition.  Will get help right away if you are not doing well or get worse. Document Released: 08/17/2006 Document Revised: 06/29/2011 Document Reviewed: 10/18/2006 Exit[employer] Patient Information 2014 Exit[employer], [place].   ________________________________________________________________________  WHAT IS A BLOOD TRANSFUSION? Blood Transfusion Information  A transfusion is the replacement of blood or some of its parts. Blood is made up of multiple cells which provide different functions.  Red blood cells carry oxygen and are used for blood loss replacement.  White blood cells fight against infection.  Platelets control bleeding.  Plasma helps clot blood.  Other blood products are available for specialized needs, such as hemophilia or other clotting disorders. BEFORE THE TRANSFUSION  Who gives blood for transfusions?   Healthy volunteers who are fully evaluated to make sure their blood is safe. This is blood bank blood. Transfusion therapy is the safest it has ever been in the practice of medicine. Before blood is taken from a donor, a complete history is taken to make sure that person has no history of diseases nor engages in risky social  behavior (examples are intravenous drug use or sexual activity with multiple partners). The donor's travel history is screened to minimize risk of transmitting infections, such as malaria. The donated blood is tested for signs of infectious diseases, such as HIV and hepatitis. The blood is then tested to be sure it is compatible with you in order to minimize the chance of a transfusion reaction. If you or a relative donates blood, this is often done in anticipation of surgery and is not appropriate for emergency situations. It takes many days to process the donated blood. RISKS AND COMPLICATIONS Although transfusion therapy is very safe and saves many lives, the main dangers of transfusion include:   Getting an infectious disease.  Developing a transfusion reaction. This is an allergic reaction to something in the blood you were given. Every precaution is taken to prevent this. The decision to have a blood transfusion has been considered carefully by your caregiver before blood is given. Blood is not given unless the benefits outweigh the risks. AFTER THE TRANSFUSION  Right after receiving a blood transfusion, you will usually feel much better and more energetic. This is especially true if your red blood cells have gotten low (anemic). The transfusion raises the level of the red blood cells which carry oxygen, and this usually causes an energy increase.  The nurse administering the transfusion will monitor you carefully for  complications. HOME CARE INSTRUCTIONS  No special instructions are needed after a transfusion. You may find your energy is better. Speak with your caregiver about any limitations on activity for underlying diseases you may have. SEEK MEDICAL CARE IF:   Your condition is not improving after your transfusion.  You develop redness or irritation at the intravenous (IV) site. SEEK IMMEDIATE MEDICAL CARE IF:  Any of the following symptoms occur over the next 12 hours:  Shaking  chills.  You have a temperature by mouth above 102 F (38.9 C), not controlled by medicine.  Chest, back, or muscle pain.  People around you feel you are not acting correctly or are confused.  Shortness of breath or difficulty breathing.  Dizziness and fainting.  You get a rash or develop hives.  You have a decrease in urine output.  Your urine turns a dark color or changes to pink, red, or brown. Any of the following symptoms occur over the next 10 days:  You have a temperature by mouth above 102 F (38.9 C), not controlled by medicine.  Shortness of breath.  Weakness after normal activity.  The white part of the eye turns yellow (jaundice).  You have a decrease in the amount of urine or are urinating less often.  Your urine turns a dark color or changes to pink, red, or brown. Document Released: 04/03/2000 Document Revised: 06/29/2011 Document Reviewed: 11/21/2007 [facility] Patient Information 2014 [place], [place].  _______________________________________________________________________

## 2017-10-18 NOTE — Pre-Procedure Instructions (Signed)
Surgical clearance Dr. Olena Heckle 07/02/2017 in chart.

## 2017-10-19 ENCOUNTER — Encounter (HOSPITAL_COMMUNITY)
Admission: RE | Admit: 2017-10-19 | Discharge: 2017-10-19 | Disposition: A | Discharge status: Home / Self Care | Payer: Managed Care, Other (non HMO) | Source: Ambulatory Visit | Attending: Orthopedic Surgery | Admitting: Orthopedic Surgery

## 2017-10-19 ENCOUNTER — Ambulatory Visit: Payer: Self-pay | Admitting: Orthopedic Surgery

## 2017-10-19 ENCOUNTER — Other Ambulatory Visit: Payer: Self-pay

## 2017-10-19 ENCOUNTER — Encounter (HOSPITAL_COMMUNITY): Payer: Self-pay

## 2017-10-19 DIAGNOSIS — M1712 Unilateral primary osteoarthritis, left knee: Secondary | ICD-10-CM | POA: Diagnosis not present

## 2017-10-19 DIAGNOSIS — Z0183 Encounter for blood typing: Secondary | ICD-10-CM | POA: Insufficient documentation

## 2017-10-19 DIAGNOSIS — R9431 Abnormal electrocardiogram [ECG] [EKG]: Secondary | ICD-10-CM | POA: Diagnosis not present

## 2017-10-19 DIAGNOSIS — Z01818 Encounter for other preprocedural examination: Secondary | ICD-10-CM | POA: Insufficient documentation

## 2017-10-19 DIAGNOSIS — Z01812 Encounter for preprocedural laboratory examination: Secondary | ICD-10-CM | POA: Insufficient documentation

## 2017-10-19 HISTORY — DX: Spinal stenosis, site unspecified: M48.00

## 2017-10-19 HISTORY — DX: Spondylosis, unspecified: M47.9

## 2017-10-19 LAB — BASIC METABOLIC PANEL
ANION GAP: 9 (ref 5–15)
BUN: 15 mg/dL (ref 8–23)
CALCIUM: 9.4 mg/dL (ref 8.9–10.3)
CO2: 27 mmol/L (ref 22–32)
Chloride: 106 mmol/L (ref 98–111)
Creatinine, Ser: 0.78 mg/dL (ref 0.44–1.00)
GFR calc Af Amer: >60 mL/min (ref >60)
GFR calc non Af Amer: >60 mL/min (ref >60)
GLUCOSE: 109 mg/dL — AB (ref 70–99)
Potassium: 3.9 mmol/L (ref 3.5–5.1)
Sodium: 142 mmol/L (ref 135–145)

## 2017-10-19 LAB — CBC
HCT: 40.3 % (ref 36.0–46.0)
HEMOGLOBIN: 13.7 g/dL (ref 12.0–15.0)
MCH: 30.6 pg (ref 26.0–34.0)
MCHC: 34 g/dL (ref 30.0–36.0)
MCV: 90 fL (ref 78.0–100.0)
Platelets: 255 10*3/uL (ref 150–400)
RBC: 4.48 MIL/uL (ref 3.87–5.11)
RDW: 13 % (ref 11.5–15.5)
WBC: 8.1 10*3/uL (ref 4.0–10.5)

## 2017-10-19 LAB — SURGICAL PCR SCREEN
MRSA, PCR: NEGATIVE
Staphylococcus aureus: POSITIVE — AB

## 2017-10-19 NOTE — Pre-Procedure Instructions (Signed)
Called [employer] in to [facility] spoke with Alease Medina, Pharmacist

## 2017-10-19 NOTE — H&P (Signed)
TOTAL KNEE ADMISSION H&P  Patient is being admitted for left total knee arthroplasty.  Subjective:  Chief Complaint:left knee pain.  HPI: Danielle Gray, 67 y.o. female, has a history of pain and functional disability in the left knee due to arthritis and has failed non-surgical conservative treatments for greater than 12 weeks to includeNSAID's and/or analgesics, corticosteriod injections, flexibility and strengthening excercises, use of assistive devices, weight reduction as appropriate and activity modification.  Onset of symptoms was gradual, starting 4 years ago with rapidlly worsening course since that time. The patient noted no past surgery on the left knee(s).  Patient currently rates pain in the left knee(s) at 10 out of 10 with activity. Patient has night pain, worsening of pain with activity and weight bearing, pain that interferes with activities of daily living, pain with passive range of motion, crepitus and joint swelling.  Patient has evidence of subchondral cysts, subchondral sclerosis, periarticular osteophytes and joint space narrowing by imaging studies.  There is no active infection.  Patient Active Problem List   Diagnosis Date Noted  . Primary osteoarthritis of right hip 12/03/2016  . Osteoarthritis of right hip 12/03/2016  . Spinal stenosis, lumbar region, with neurogenic claudication 05/22/2015   Past Medical History:  Diagnosis Date  . Arthritis   . Back pain   . GERD (gastroesophageal reflux disease)   . 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 Hypertension   . Lumbar herniated disc   . Spinal stenosis   . Spondylosis     Past Surgical History:  Procedure Laterality Date  . BACK SURGERY     2017 Lumbar Stenosis  . BLADDER TACK  25 YRS AGO  . BREAST SURGERY     biopsy   . colonscopy     . JOINT REPLACEMENT  2006   RT TOTAL KNEE  . LUMBAR LAMINECTOMY/DECOMPRESSION MICRODISCECTOMY Left 05/22/2015   Procedure: CENTRAL DECOMPRESSION LUMBAR  LAMINECTOMY L3-4 AND L4-5, 2 LEVEL SPINAL STENOSIS WITH MICRODISCECTOMY L3-4 ON LEFT AND FORAMINOTOMY L3-4 AND L4-5 LEFT;  Surgeon: Latanya Maudlin, MD;  Location: [facility];  Service: Orthopedics;  Laterality: Left;  . TOTAL HIP ARTHROPLASTY Left 03/26/2016   Procedure: LEFT TOTAL HIP ARTHROPLASTY ANTERIOR APPROACH;  Surgeon: Rod Can, MD;  Location: [facility];  Service: Orthopedics;  Laterality: Left;  Nedds RNFA  . TOTAL HIP ARTHROPLASTY Right 12/03/2016   Procedure: RIGHT TOTAL HIP ARTHROPLASTY ANTERIOR APPROACH;  Surgeon: Rod Can, MD;  Location: [facility];  Service: Orthopedics;  Laterality: Right;  Needs RNFA  . TUBAL LIGATION    . UPPER GI ENDOSCOPY      Current Outpatient Medications  Medication Sig Dispense Refill Last Dose  . apixaban (ELIQUIS) 2.5 MG TABS tablet Take 1 tablet (2.5 mg total) by mouth every 12 (twelve) hours. (Patient not taking: Reported on 10/13/2017) 60 tablet 0 Completed Course at Unknown time  . cholecalciferol (VITAMIN D) 1000 units tablet Take 1,000 Units by mouth daily.   Taking  . Coenzyme Q10 (COQ-10 PO) Take 1 tablet by mouth every other day.     . ibuprofen (ADVIL,MOTRIN) 800 MG tablet Take 800 mg by mouth 3 (three) times daily as needed for headache or moderate pain.    Taking  . vitamin B-12 (CYANOCOBALAMIN) 1000 MCG tablet Take 1,000 mcg by mouth daily.      No current facility-administered medications for this visit.    No Known Allergies  Social History   Tobacco Use  .  Smoking status: Never Smoker  . Smokeless tobacco: Never Used  Substance Use Topics  . Alcohol use: Yes    Comment: OCCASIONAL    No family history on file.   Review of Systems  Constitutional: Negative.   HENT: Negative.   Eyes: Negative.   Respiratory: Negative.   Cardiovascular: Negative.   Gastrointestinal: Negative.   Genitourinary: Negative.   Musculoskeletal: Positive for joint pain.  Skin: Negative.   Neurological: Negative.   Endo/Heme/Allergies: Negative.    Psychiatric/Behavioral: Negative.     Objective:  Physical Exam  Vitals reviewed. Constitutional: She is oriented to person, place, and time. She appears well-developed and well-nourished.  HENT:  Head: Normocephalic and atraumatic.  Eyes: Pupils are equal, round, and reactive to light. Conjunctivae and EOM are normal.  Neck: Normal range of motion. Neck supple.  Cardiovascular: Normal rate, regular rhythm and intact distal pulses.  Respiratory: No respiratory distress.  GI: Soft. She exhibits no distension.  Genitourinary:  Genitourinary Comments: deferred  Musculoskeletal:       Left knee: She exhibits swelling, effusion and abnormal alignment. Tenderness found. Medial joint line and lateral joint line tenderness noted.  Neurological: She is alert and oriented to person, place, and time. She has normal reflexes.  Skin: Skin is warm and dry.  Psychiatric: She has a normal mood and affect. Her behavior is normal. Judgment and thought content normal.    Vital signs in last 24 hours: @VSRANGES @  Labs:   Estimated body mass index is 36.94 kg/m as calculated from the following:   Height as of 12/03/16: 5\' 5"  (1.651 m).   Weight as of 12/03/16: 100.7 kg (222 lb).   Imaging Review Plain radiographs demonstrate severe degenerative joint disease of the left knee(s). The overall alignment issignificant varus. The bone quality appears to be adequate for age and reported activity level.   Preoperative templating of the joint replacement has been completed, documented, and submitted to the Operating Room personnel in order to optimize intra-operative equipment management.   Anticipated LOS equal to or greater than 2 midnights due to - Age 75 and older with one or more of the following:  - Obesity  - Expected need for hospital services (PT, OT, Nursing) required for safe  discharge  - Anticipated need for postoperative skilled nursing care or inpatient rehab  - Active  co-morbidities: None OR   - Unanticipated findings during/Post Surgery: None  - Patient is a high risk of re-admission due to: None     Assessment/Plan:  End stage arthritis, left knee   The patient history, physical examination, clinical judgment of the provider and imaging studies are consistent with end stage degenerative joint disease of the left knee(s) and total knee arthroplasty is deemed medically necessary. The treatment options including medical management, injection therapy arthroscopy and arthroplasty were discussed at length. The risks and benefits of total knee arthroplasty were presented and reviewed. The risks due to aseptic loosening, infection, stiffness, patella tracking problems, thromboembolic complications and other imponderables were discussed. The patient acknowledged the explanation, agreed to proceed with the plan and consent was signed. Patient is being admitted for inpatient treatment for surgery, pain control, PT, OT, prophylactic antibiotics, VTE prophylaxis, progressive ambulation and ADL's and discharge planning. The patient is planning to be discharged home with outpatient PT at [facility]. Has DME.

## 2017-10-19 NOTE — H&P (View-Only) (Signed)
TOTAL KNEE ADMISSION H&P  Patient is being admitted for left total knee arthroplasty.  Subjective:  Chief Complaint:left knee pain.  HPI: Danielle Gray, 67 y.o. female, has a history of pain and functional disability in the left knee due to arthritis and has failed non-surgical conservative treatments for greater than 12 weeks to includeNSAID's and/or analgesics, corticosteriod injections, flexibility and strengthening excercises, use of assistive devices, weight reduction as appropriate and activity modification.  Onset of symptoms was gradual, starting 4 years ago with rapidlly worsening course since that time. The patient noted no past surgery on the left knee(s).  Patient currently rates pain in the left knee(s) at 10 out of 10 with activity. Patient has night pain, worsening of pain with activity and weight bearing, pain that interferes with activities of daily living, pain with passive range of motion, crepitus and joint swelling.  Patient has evidence of subchondral cysts, subchondral sclerosis, periarticular osteophytes and joint space narrowing by imaging studies.  There is no active infection.  Patient Active Problem List   Diagnosis Date Noted  . Primary osteoarthritis of right hip 12/03/2016  . Osteoarthritis of right hip 12/03/2016  . Spinal stenosis, lumbar region, with neurogenic claudication 05/22/2015   Past Medical History:  Diagnosis Date  . Arthritis   . Back pain   . GERD (gastroesophageal reflux disease)   . 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 Hypertension   . Lumbar herniated disc   . Spinal stenosis   . Spondylosis     Past Surgical History:  Procedure Laterality Date  . BACK SURGERY     2017 Lumbar Stenosis  . BLADDER TACK  25 YRS AGO  . BREAST SURGERY     biopsy   . colonscopy     . JOINT REPLACEMENT  2006   RT TOTAL KNEE  . LUMBAR LAMINECTOMY/DECOMPRESSION MICRODISCECTOMY Left 05/22/2015   Procedure: CENTRAL DECOMPRESSION LUMBAR  LAMINECTOMY L3-4 AND L4-5, 2 LEVEL SPINAL STENOSIS WITH MICRODISCECTOMY L3-4 ON LEFT AND FORAMINOTOMY L3-4 AND L4-5 LEFT;  Surgeon: Latanya Maudlin, MD;  Location: [facility];  Service: Orthopedics;  Laterality: Left;  . TOTAL HIP ARTHROPLASTY Left 03/26/2016   Procedure: LEFT TOTAL HIP ARTHROPLASTY ANTERIOR APPROACH;  Surgeon: Rod Can, MD;  Location: [facility];  Service: Orthopedics;  Laterality: Left;  [place]ds RNFA  . TOTAL HIP ARTHROPLASTY Right 12/03/2016   Procedure: RIGHT TOTAL HIP ARTHROPLASTY ANTERIOR APPROACH;  Surgeon: Rod Can, MD;  Location: [facility];  Service: Orthopedics;  Laterality: Right;  Needs RNFA  . TUBAL LIGATION    . UPPER GI ENDOSCOPY      Current Outpatient Medications  Medication Sig Dispense Refill Last Dose  . apixaban (ELIQUIS) 2.5 MG TABS tablet Take 1 tablet (2.5 mg total) by mouth every 12 (twelve) hours. (Patient not taking: Reported on 10/13/2017) 60 tablet 0 Completed Course at Unknown time  . cholecalciferol (VITAMIN D) 1000 units tablet Take 1,000 Units by mouth daily.   Taking  . Coenzyme Q10 (COQ-10 PO) Take 1 tablet by mouth every other day.     . ibuprofen (ADVIL,MOTRIN) 800 MG tablet Take 800 mg by mouth 3 (three) times daily as needed for headache or moderate pain.    Taking  . vitamin B-12 (CYANOCOBALAMIN) 1000 MCG tablet Take 1,000 mcg by mouth daily.      No current facility-administered medications for this visit.    No Known Allergies  Social History   Tobacco Use  .  Smoking status: Never Smoker  . Smokeless tobacco: Never Used  Substance Use Topics  . Alcohol use: Yes    Comment: OCCASIONAL    No family history on file.   Review of Systems  Constitutional: Negative.   HENT: Negative.   Eyes: Negative.   Respiratory: Negative.   Cardiovascular: Negative.   Gastrointestinal: Negative.   Genitourinary: Negative.   Musculoskeletal: Positive for joint pain.  Skin: Negative.   Neurological: Negative.   Endo/Heme/Allergies: Negative.    Psychiatric/Behavioral: Negative.     Objective:  Physical Exam  Vitals reviewed. Constitutional: She is oriented to person, place, and time. She appears well-developed and well-nourished.  HENT:  Head: Normocephalic and atraumatic.  Eyes: Pupils are equal, round, and reactive to light. Conjunctivae and EOM are normal.  Neck: Normal range of motion. Neck supple.  Cardiovascular: Normal rate, regular rhythm and intact distal pulses.  Respiratory: No respiratory distress.  GI: Soft. She exhibits no distension.  Genitourinary:  Genitourinary Comments: deferred  Musculoskeletal:       Left knee: She exhibits swelling, effusion and abnormal alignment. Tenderness found. Medial joint line and lateral joint line tenderness noted.  Neurological: She is alert and oriented to person, place, and time. She has normal reflexes.  Skin: Skin is warm and dry.  Psychiatric: She has a normal mood and affect. Her behavior is normal. Judgment and thought content normal.    Vital signs in last 24 hours: @VSRANGES @  Labs:   Estimated body mass index is 36.94 kg/m as calculated from the following:   Height as of 12/03/16: 5\' 5"  (1.651 m).   Weight as of 12/03/16: 100.7 kg (222 lb).   Imaging Review Plain radiographs demonstrate severe degenerative joint disease of the left knee(s). The overall alignment issignificant varus. The bone quality appears to be adequate for age and reported activity level.   Preoperative templating of the joint replacement has been completed, documented, and submitted to the Operating Room personnel in order to optimize intra-operative equipment management.   Anticipated LOS equal to or greater than 2 midnights due to - Age 33 and older with one or more of the following:  - Obesity  - Expected need for hospital services (PT, OT, Nursing) required for safe  discharge  - Anticipated need for postoperative skilled nursing care or inpatient rehab  - Active  co-morbidities: None OR   - Unanticipated findings during/Post Surgery: None  - Patient is a high risk of re-admission due to: None     Assessment/Plan:  End stage arthritis, left knee   The patient history, physical examination, clinical judgment of the provider and imaging studies are consistent with end stage degenerative joint disease of the left knee(s) and total knee arthroplasty is deemed medically necessary. The treatment options including medical management, injection therapy arthroscopy and arthroplasty were discussed at length. The risks and benefits of total knee arthroplasty were presented and reviewed. The risks due to aseptic loosening, infection, stiffness, patella tracking problems, thromboembolic complications and other imponderables were discussed. The patient acknowledged the explanation, agreed to proceed with the plan and consent was signed. Patient is being admitted for inpatient treatment for surgery, pain control, PT, OT, prophylactic antibiotics, VTE prophylaxis, progressive ambulation and ADL's and discharge planning. The patient is planning to be discharged home with outpatient PT at [facility]. Has DME.

## 2017-10-20 NOTE — Pre-Procedure Instructions (Signed)
Danielle Gray returned call and is aware that she needs to pick up her Mupirocin.

## 2017-10-27 MED ORDER — TRANEXAMIC ACID 1000 MG/10ML IV SOLN
1000.0000 mg | INTRAVENOUS | Status: AC
  Administered 2017-10-28: 1000 mg via INTRAVENOUS
  Filled 2017-10-27: qty 1100

## 2017-10-28 ENCOUNTER — Encounter (HOSPITAL_COMMUNITY)
Admission: RE | Disposition: A | Discharge status: Home / Self Care | Payer: Self-pay | Source: Ambulatory Visit | Attending: Orthopedic Surgery

## 2017-10-28 ENCOUNTER — Inpatient Hospital Stay (HOSPITAL_COMMUNITY)
Admission: RE | Admit: 2017-10-28 | Discharge: 2017-10-29 | DRG: 470 | Disposition: A | Discharge status: Home / Self Care | Payer: Managed Care, Other (non HMO) | Source: Ambulatory Visit | Attending: Orthopedic Surgery | Admitting: Orthopedic Surgery

## 2017-10-28 ENCOUNTER — Inpatient Hospital Stay (HOSPITAL_COMMUNITY): Payer: Managed Care, Other (non HMO) | Admitting: Certified Registered Nurse Anesthetist

## 2017-10-28 ENCOUNTER — Inpatient Hospital Stay (HOSPITAL_COMMUNITY): Payer: Managed Care, Other (non HMO)

## 2017-10-28 ENCOUNTER — Encounter (HOSPITAL_COMMUNITY): Payer: Self-pay | Admitting: *Deleted

## 2017-10-28 ENCOUNTER — Other Ambulatory Visit: Payer: Self-pay

## 2017-10-28 DIAGNOSIS — K219 Gastro-esophageal reflux disease without esophagitis: Secondary | ICD-10-CM | POA: Diagnosis present

## 2017-10-28 DIAGNOSIS — Z96651 Presence of right artificial knee joint: Secondary | ICD-10-CM | POA: Diagnosis present

## 2017-10-28 DIAGNOSIS — Z9851 Tubal ligation status: Secondary | ICD-10-CM

## 2017-10-28 DIAGNOSIS — Z7901 Long term (current) use of anticoagulants: Secondary | ICD-10-CM

## 2017-10-28 DIAGNOSIS — Z86718 Personal history of other venous thrombosis and embolism: Secondary | ICD-10-CM | POA: Diagnosis not present

## 2017-10-28 DIAGNOSIS — I10 Essential (primary) hypertension: Secondary | ICD-10-CM | POA: Diagnosis present

## 2017-10-28 DIAGNOSIS — M1712 Unilateral primary osteoarthritis, left knee: Secondary | ICD-10-CM | POA: Diagnosis present

## 2017-10-28 DIAGNOSIS — Z96652 Presence of left artificial knee joint: Secondary | ICD-10-CM

## 2017-10-28 DIAGNOSIS — Z96643 Presence of artificial hip joint, bilateral: Secondary | ICD-10-CM | POA: Diagnosis present

## 2017-10-28 HISTORY — PX: KNEE ARTHROPLASTY: SHX992

## 2017-10-28 LAB — TYPE AND SCREEN
ABO/RH(D): O POS
ANTIBODY SCREEN: NEGATIVE

## 2017-10-28 SURGERY — ARTHROPLASTY, KNEE, TOTAL, USING IMAGELESS COMPUTER-ASSISTED NAVIGATION
Anesthesia: Spinal | Site: Knee | Laterality: Left

## 2017-10-28 MED ORDER — ONDANSETRON HCL 4 MG/2ML IJ SOLN
INTRAMUSCULAR | Status: AC
  Filled 2017-10-28: qty 2

## 2017-10-28 MED ORDER — LACTATED RINGERS IV SOLN
INTRAVENOUS | Status: DC
  Administered 2017-10-28: 07:00:00 via INTRAVENOUS

## 2017-10-28 MED ORDER — BUPIVACAINE IN DEXTROSE 0.75-8.25 % IT SOLN
INTRATHECAL | Status: DC | PRN
  Administered 2017-10-28: 1.6 mL via INTRATHECAL

## 2017-10-28 MED ORDER — METOCLOPRAMIDE HCL 5 MG/ML IJ SOLN: 10.0000 mg | Freq: Once | INTRAMUSCULAR | Status: DC | PRN

## 2017-10-28 MED ORDER — SODIUM CHLORIDE 0.9 % IR SOLN
Status: DC | PRN
  Administered 2017-10-28: 1000 mL

## 2017-10-28 MED ORDER — MIDAZOLAM HCL 2 MG/2ML IJ SOLN
INTRAMUSCULAR | Status: AC
  Filled 2017-10-28: qty 2

## 2017-10-28 MED ORDER — STERILE WATER FOR IRRIGATION IR SOLN
Status: DC | PRN
  Administered 2017-10-28: 2000 mL

## 2017-10-28 MED ORDER — ONDANSETRON HCL 4 MG/2ML IJ SOLN: 4.0000 mg | Freq: Four times a day (QID) | INTRAMUSCULAR | Status: DC | PRN

## 2017-10-28 MED ORDER — CEFAZOLIN SODIUM-DEXTROSE 2-4 GM/100ML-% IV SOLN
INTRAVENOUS | Status: AC
  Filled 2017-10-28: qty 100

## 2017-10-28 MED ORDER — ISOPROPYL ALCOHOL 70 % SOLN
Status: DC | PRN
  Administered 2017-10-28: 1 via TOPICAL

## 2017-10-28 MED ORDER — CLONIDINE HCL (ANALGESIA) 100 MCG/ML EP SOLN
EPIDURAL | Status: DC | PRN
  Administered 2017-10-28: 100 ug

## 2017-10-28 MED ORDER — BUPIVACAINE HCL (PF) 0.5 % IJ SOLN
INTRAMUSCULAR | Status: DC | PRN
  Administered 2017-10-28: 30 mL

## 2017-10-28 MED ORDER — PROPOFOL 500 MG/50ML IV EMUL
INTRAVENOUS | Status: DC | PRN
  Administered 2017-10-28: 100 ug/kg/min via INTRAVENOUS

## 2017-10-28 MED ORDER — LACTATED RINGERS IV SOLN: INTRAVENOUS | Status: DC

## 2017-10-28 MED ORDER — CEFAZOLIN SODIUM-DEXTROSE 2-4 GM/100ML-% IV SOLN
2.0000 g | INTRAVENOUS | Status: AC
  Administered 2017-10-28: 2 g via INTRAVENOUS
  Filled 2017-10-28: qty 100

## 2017-10-28 MED ORDER — ISOPROPYL ALCOHOL 70 % SOLN
Status: AC
  Filled 2017-10-28: qty 480

## 2017-10-28 MED ORDER — BUPIVACAINE-EPINEPHRINE 0.25% -1:200000 IJ SOLN
INTRAMUSCULAR | Status: DC | PRN
  Administered 2017-10-28: 30 mL

## 2017-10-28 MED ORDER — PROPOFOL 10 MG/ML IV BOLUS
INTRAVENOUS | Status: AC
  Filled 2017-10-28: qty 40

## 2017-10-28 MED ORDER — METOCLOPRAMIDE HCL 5 MG PO TABS: 5.0000 mg | ORAL_TABLET | Freq: Three times a day (TID) | ORAL | Status: DC | PRN

## 2017-10-28 MED ORDER — DOCUSATE SODIUM 100 MG PO CAPS
100.0000 mg | ORAL_CAPSULE | Freq: Two times a day (BID) | ORAL | Status: DC
Start: 2017-10-28 — End: 2017-10-29
  Administered 2017-10-28 – 2017-10-29 (×2): 100 mg via ORAL
  Filled 2017-10-28 (×2): qty 1

## 2017-10-28 MED ORDER — ACETAMINOPHEN 325 MG PO TABS: 325.0000 mg | ORAL_TABLET | Freq: Four times a day (QID) | ORAL | Status: DC | PRN

## 2017-10-28 MED ORDER — APIXABAN 2.5 MG PO TABS
2.5000 mg | ORAL_TABLET | Freq: Two times a day (BID) | ORAL | Status: DC
  Administered 2017-10-29: 2.5 mg via ORAL
  Filled 2017-10-28: qty 1

## 2017-10-28 MED ORDER — MIDAZOLAM HCL 5 MG/5ML IJ SOLN
INTRAMUSCULAR | Status: DC | PRN
  Administered 2017-10-28 (×2): 1 mg via INTRAVENOUS

## 2017-10-28 MED ORDER — PHENOL 1.4 % MT LIQD: 1.0000 | OROMUCOSAL | Status: DC | PRN

## 2017-10-28 MED ORDER — PROPOFOL 10 MG/ML IV BOLUS
INTRAVENOUS | Status: DC | PRN
  Administered 2017-10-28: 20 mg via INTRAVENOUS

## 2017-10-28 MED ORDER — FENTANYL CITRATE (PF) 100 MCG/2ML IJ SOLN
INTRAMUSCULAR | Status: AC
  Filled 2017-10-28: qty 2

## 2017-10-28 MED ORDER — DEXAMETHASONE SODIUM PHOSPHATE 10 MG/ML IJ SOLN
INTRAMUSCULAR | Status: DC | PRN
  Administered 2017-10-28: 10 mg via INTRAVENOUS

## 2017-10-28 MED ORDER — FENTANYL CITRATE (PF) 100 MCG/2ML IJ SOLN
INTRAMUSCULAR | Status: DC | PRN
  Administered 2017-10-28 (×2): 50 ug via INTRAVENOUS

## 2017-10-28 MED ORDER — DIPHENHYDRAMINE HCL 12.5 MG/5ML PO ELIX: 12.5000 mg | ORAL_SOLUTION | ORAL | Status: DC | PRN

## 2017-10-28 MED ORDER — PROPOFOL 10 MG/ML IV BOLUS
INTRAVENOUS | Status: AC
  Filled 2017-10-28: qty 20

## 2017-10-28 MED ORDER — CHLORHEXIDINE GLUCONATE 4 % EX LIQD: 60.0000 mL | Freq: Once | CUTANEOUS | Status: DC

## 2017-10-28 MED ORDER — POVIDONE-IODINE 10 % EX SWAB
2.0000 "application " | Freq: Once | CUTANEOUS | Status: AC
  Administered 2017-10-28: 2 via TOPICAL

## 2017-10-28 MED ORDER — POLYETHYLENE GLYCOL 3350 17 G PO PACK: 17.0000 g | PACK | Freq: Every day | ORAL | Status: DC | PRN

## 2017-10-28 MED ORDER — PROPOFOL 10 MG/ML IV BOLUS
INTRAVENOUS | Status: AC
Start: 2017-10-28 — End: ?
  Filled 2017-10-28: qty 40

## 2017-10-28 MED ORDER — DEXAMETHASONE SODIUM PHOSPHATE 10 MG/ML IJ SOLN
10.0000 mg | Freq: Once | INTRAMUSCULAR | Status: AC
  Administered 2017-10-29: 10 mg via INTRAVENOUS
  Filled 2017-10-28: qty 1

## 2017-10-28 MED ORDER — ACETAMINOPHEN 10 MG/ML IV SOLN
1000.0000 mg | INTRAVENOUS | Status: AC
  Administered 2017-10-28: 1000 mg via INTRAVENOUS
  Filled 2017-10-28: qty 100

## 2017-10-28 MED ORDER — ONDANSETRON HCL 4 MG/2ML IJ SOLN
INTRAMUSCULAR | Status: DC | PRN
  Administered 2017-10-28: 4 mg via INTRAVENOUS

## 2017-10-28 MED ORDER — MENTHOL 3 MG MT LOZG: 1.0000 | LOZENGE | OROMUCOSAL | Status: DC | PRN

## 2017-10-28 MED ORDER — SODIUM CHLORIDE 0.9 % IJ SOLN
INTRAMUSCULAR | Status: AC
  Filled 2017-10-28: qty 50

## 2017-10-28 MED ORDER — DEXAMETHASONE SODIUM PHOSPHATE 10 MG/ML IJ SOLN
INTRAMUSCULAR | Status: AC
  Filled 2017-10-28: qty 1

## 2017-10-28 MED ORDER — BUPIVACAINE-EPINEPHRINE (PF) 0.25% -1:200000 IJ SOLN
INTRAMUSCULAR | Status: AC
  Filled 2017-10-28: qty 30

## 2017-10-28 MED ORDER — KETOROLAC TROMETHAMINE 30 MG/ML IJ SOLN
INTRAMUSCULAR | Status: AC
  Filled 2017-10-28: qty 1

## 2017-10-28 MED ORDER — HYDROCODONE-ACETAMINOPHEN 7.5-325 MG PO TABS
1.0000 | ORAL_TABLET | ORAL | Status: DC | PRN
  Administered 2017-10-28 – 2017-10-29 (×2): 2 via ORAL
  Filled 2017-10-28 (×2): qty 2

## 2017-10-28 MED ORDER — SODIUM CHLORIDE 0.9 % IV SOLN
INTRAVENOUS | Status: DC
  Administered 2017-10-28 (×2): via INTRAVENOUS

## 2017-10-28 MED ORDER — ONDANSETRON HCL 4 MG PO TABS
4.0000 mg | ORAL_TABLET | Freq: Four times a day (QID) | ORAL | Status: DC | PRN
Start: 2017-10-28 — End: 2017-10-29

## 2017-10-28 MED ORDER — KETOROLAC TROMETHAMINE 30 MG/ML IJ SOLN
INTRAMUSCULAR | Status: DC | PRN
  Administered 2017-10-28: 30 mg

## 2017-10-28 MED ORDER — HYDROCODONE-ACETAMINOPHEN 5-325 MG PO TABS
1.0000 | ORAL_TABLET | ORAL | Status: DC | PRN
  Administered 2017-10-28: 2 via ORAL
  Administered 2017-10-28: 1 via ORAL
  Administered 2017-10-29: 2 via ORAL
  Filled 2017-10-28: qty 1
  Filled 2017-10-28 (×2): qty 2

## 2017-10-28 MED ORDER — EPHEDRINE SULFATE 50 MG/ML IJ SOLN
INTRAMUSCULAR | Status: DC | PRN
  Administered 2017-10-28: 10 mg via INTRAVENOUS

## 2017-10-28 MED ORDER — SODIUM CHLORIDE 0.9 % IV SOLN: INTRAVENOUS | Status: DC

## 2017-10-28 MED ORDER — MEPERIDINE HCL 50 MG/ML IJ SOLN: 6.2500 mg | INTRAMUSCULAR | Status: DC | PRN

## 2017-10-28 MED ORDER — SODIUM CHLORIDE 0.9 % IJ SOLN
INTRAMUSCULAR | Status: DC | PRN
  Administered 2017-10-28: 30 mL

## 2017-10-28 MED ORDER — METHOCARBAMOL 1000 MG/10ML IJ SOLN
500.0000 mg | Freq: Four times a day (QID) | INTRAVENOUS | Status: DC | PRN
  Administered 2017-10-28: 500 mg via INTRAVENOUS
  Filled 2017-10-28: qty 550

## 2017-10-28 MED ORDER — ALUM & MAG HYDROXIDE-SIMETH 200-200-20 MG/5ML PO SUSP: 30.0000 mL | ORAL | Status: DC | PRN

## 2017-10-28 MED ORDER — METOCLOPRAMIDE HCL 5 MG/ML IJ SOLN: 5.0000 mg | Freq: Three times a day (TID) | INTRAMUSCULAR | Status: DC | PRN

## 2017-10-28 MED ORDER — KETOROLAC TROMETHAMINE 15 MG/ML IJ SOLN
7.5000 mg | Freq: Four times a day (QID) | INTRAMUSCULAR | Status: DC
  Administered 2017-10-28 – 2017-10-29 (×3): 7.5 mg via INTRAVENOUS
  Filled 2017-10-28 (×3): qty 1

## 2017-10-28 MED ORDER — FENTANYL CITRATE (PF) 100 MCG/2ML IJ SOLN
INTRAMUSCULAR | Status: AC
  Filled 2017-10-28: qty 4

## 2017-10-28 MED ORDER — CEFAZOLIN SODIUM-DEXTROSE 2-4 GM/100ML-% IV SOLN
2.0000 g | Freq: Four times a day (QID) | INTRAVENOUS | Status: AC
  Administered 2017-10-28 (×2): 2 g via INTRAVENOUS
  Filled 2017-10-28 (×2): qty 100

## 2017-10-28 MED ORDER — FENTANYL CITRATE (PF) 100 MCG/2ML IJ SOLN: 25.0000 ug | INTRAMUSCULAR | Status: DC | PRN

## 2017-10-28 MED ORDER — SODIUM CHLORIDE 0.9 % IR SOLN
Status: DC | PRN
  Administered 2017-10-28: 3000 mL

## 2017-10-28 MED ORDER — METHOCARBAMOL 500 MG PO TABS
500.0000 mg | ORAL_TABLET | Freq: Four times a day (QID) | ORAL | Status: DC | PRN
  Administered 2017-10-28 – 2017-10-29 (×2): 500 mg via ORAL
  Filled 2017-10-28 (×2): qty 1

## 2017-10-28 MED ORDER — MORPHINE SULFATE (PF) 2 MG/ML IV SOLN: 0.5000 mg | INTRAVENOUS | Status: DC | PRN

## 2017-10-28 SURGICAL SUPPLY — 79 items
ADH SKN CLS APL DERMABOND .7 (GAUZE/BANDAGES/DRESSINGS) ×1
BAG SPEC THK2 15X12 ZIP CLS (MISCELLANEOUS)
BAG ZIPLOCK 12X15 (MISCELLANEOUS) IMPLANT
BANDAGE ACE 4X5 VEL STRL LF (GAUZE/BANDAGES/DRESSINGS) ×3 IMPLANT
BANDAGE ACE 6X5 VEL STRL LF (GAUZE/BANDAGES/DRESSINGS) ×3 IMPLANT
BATTERY INSTRU NAVIGATION (MISCELLANEOUS) ×9 IMPLANT
BLADE SAW RECIPROCATING 77.5 (BLADE) ×3 IMPLANT
BSPLAT TIB 5 KN TRITANIUM (Knees) ×1 IMPLANT
BTRY SRG DRVR LF (MISCELLANEOUS) ×3
CHLORAPREP W/TINT 26ML (MISCELLANEOUS) ×6 IMPLANT
COMP FEM SZ5 CRUC LEFT RETAIN (Orthopedic Implant) ×3 IMPLANT
COMPONENT FEM SZ5 CRU LT RETN (Orthopedic Implant) IMPLANT
COVER SURGICAL LIGHT HANDLE (MISCELLANEOUS) ×3 IMPLANT
CUFF TOURN SGL QUICK 34 (TOURNIQUET CUFF) ×3
CUFF TRNQT CYL 34X4X40X1 (TOURNIQUET CUFF) ×1 IMPLANT
DECANTER SPIKE VIAL GLASS SM (MISCELLANEOUS) ×6 IMPLANT
DERMABOND ADVANCED (GAUZE/BANDAGES/DRESSINGS) ×2
DERMABOND ADVANCED .7 DNX12 (GAUZE/BANDAGES/DRESSINGS) ×1 IMPLANT
DRAPE SHEET LG 3/4 BI-LAMINATE (DRAPES) ×8 IMPLANT
DRAPE U-SHAPE 47X51 STRL (DRAPES) ×3 IMPLANT
DRSG AQUACEL AG ADV 3.5X10 (GAUZE/BANDAGES/DRESSINGS) ×3 IMPLANT
DRSG TEGADERM 4X4.75 (GAUZE/BANDAGES/DRESSINGS) IMPLANT
ELECT BLADE TIP CTD 4 INCH (ELECTRODE) ×3 IMPLANT
ELECT REM PT RETURN 15FT ADLT (MISCELLANEOUS) ×3 IMPLANT
EVACUATOR 1/8 PVC DRAIN (DRAIN) IMPLANT
GAUZE SPONGE 4X4 12PLY STRL (GAUZE/BANDAGES/DRESSINGS) ×3 IMPLANT
GLOVE BIO SURGEON STRL SZ8.5 (GLOVE) ×6 IMPLANT
GLOVE BIOGEL M STRL SZ7.5 (GLOVE) ×2 IMPLANT
GLOVE BIOGEL PI IND STRL 6.5 (GLOVE) IMPLANT
GLOVE BIOGEL PI IND STRL 7.0 (GLOVE) ×4 IMPLANT
GLOVE BIOGEL PI IND STRL 7.5 (GLOVE) IMPLANT
GLOVE BIOGEL PI IND STRL 8.5 (GLOVE) ×1 IMPLANT
GLOVE BIOGEL PI IND STRL 9 (GLOVE) IMPLANT
GLOVE BIOGEL PI INDICATOR 6.5 (GLOVE) ×2
GLOVE BIOGEL PI INDICATOR 7.0 (GLOVE) ×8
GLOVE BIOGEL PI INDICATOR 7.5 (GLOVE) ×6
GLOVE BIOGEL PI INDICATOR 8.5 (GLOVE) ×2
GLOVE BIOGEL PI INDICATOR 9 (GLOVE) ×2
GLOVE SURG SS PI 6.0 STRL IVOR (GLOVE) ×2 IMPLANT
GOWN SPEC L3 XXLG W/TWL (GOWN DISPOSABLE) ×3 IMPLANT
GOWN SPEC L4 XLG W/TWL (GOWN DISPOSABLE) ×4 IMPLANT
GOWN STRL REUS W/TWL LRG LVL4 (GOWN DISPOSABLE) ×4 IMPLANT
HANDPIECE INTERPULSE COAX TIP (DISPOSABLE) ×3
HOOD PEEL AWAY FLYTE STAYCOOL (MISCELLANEOUS) ×9 IMPLANT
INSERT TIBIAL BEARING 11MM (Insert) ×3 IMPLANT
KNEE PATELLA ASYMMETRIC 10X32 (Knees) ×3 IMPLANT
KNEE TIBIAL COMPONENT SZ5 (Knees) ×3 IMPLANT
MARKER SKIN DUAL TIP RULER LAB (MISCELLANEOUS) ×3 IMPLANT
NDL SAFETY ECLIPSE 18X1.5 (NEEDLE) ×1 IMPLANT
NEEDLE HYPO 18GX1.5 SHARP (NEEDLE) ×3
NEEDLE SPNL 18GX3.5 QUINCKE PK (NEEDLE) ×3 IMPLANT
NS IRRIG 1000ML POUR BTL (IV SOLUTION) ×3 IMPLANT
PACK TOTAL KNEE CUSTOM (KITS) ×3 IMPLANT
PADDING CAST COTTON 6X4 STRL (CAST SUPPLIES) ×3 IMPLANT
POSITIONER SURGICAL ARM (MISCELLANEOUS) ×3 IMPLANT
SAW OSC TIP CART 19.5X105X1.3 (SAW) ×3 IMPLANT
SEALER BIPOLAR AQUA 6.0 (INSTRUMENTS) ×3 IMPLANT
SET HNDPC FAN SPRY TIP SCT (DISPOSABLE) ×1 IMPLANT
SET PAD KNEE POSITIONER (MISCELLANEOUS) ×3 IMPLANT
SPONGE DRAIN TRACH 4X4 STRL 2S (GAUZE/BANDAGES/DRESSINGS) IMPLANT
SPONGE LAP 18X18 RF (DISPOSABLE) IMPLANT
SUT MNCRL AB 3-0 PS2 18 (SUTURE) ×3 IMPLANT
SUT MON AB 2-0 CT1 36 (SUTURE) ×4 IMPLANT
SUT STRATAFIX PDO 1 14 VIOLET (SUTURE) ×3
SUT STRATFX PDO 1 14 VIOLET (SUTURE) ×1
SUT VIC AB 1 CT1 36 (SUTURE) ×5 IMPLANT
SUT VIC AB 1 CTX 36 (SUTURE) ×6
SUT VIC AB 1 CTX36XBRD ANBCTR (SUTURE) IMPLANT
SUT VIC AB 2-0 CT1 27 (SUTURE) ×3
SUT VIC AB 2-0 CT1 TAPERPNT 27 (SUTURE) ×1 IMPLANT
SUTURE STRATFX PDO 1 14 VIOLET (SUTURE) ×1 IMPLANT
SYR 3ML LL SCALE MARK (SYRINGE) ×2 IMPLANT
SYR 50ML LL SCALE MARK (SYRINGE) ×3 IMPLANT
TOWER CARTRIDGE SMART MIX (DISPOSABLE) IMPLANT
TRAY FOLEY CATH 14FRSI W/METER (CATHETERS) ×2 IMPLANT
TRAY FOLEY MTR SLVR 16FR STAT (SET/KITS/TRAYS/PACK) IMPLANT
WATER STERILE IRR 1000ML POUR (IV SOLUTION) ×6 IMPLANT
WRAP KNEE MAXI GEL POST OP (GAUZE/BANDAGES/DRESSINGS) ×3 IMPLANT
YANKAUER SUCT BULB TIP 10FT TU (MISCELLANEOUS) ×3 IMPLANT

## 2017-10-28 NOTE — Anesthesia Preprocedure Evaluation (Signed)
Anesthesia Evaluation  Patient identified by MRN, date of birth, ID band Patient awake    Reviewed: Allergy & Precautions, NPO status , Patient's Chart, lab work & pertinent test results  Airway Mallampati: II  TM Distance: >3 FB Neck ROM: Full    Dental no notable dental hx.    Pulmonary neg pulmonary ROS,    Pulmonary exam normal breath sounds clear to auscultation       Cardiovascular hypertension, Pt. on medications Normal cardiovascular exam Rhythm:Regular Rate:Normal     Neuro/Psych negative neurological ROS  negative psych ROS   GI/Hepatic negative GI ROS, Neg liver ROS,   Endo/Other  negative endocrine ROS  Renal/GU negative Renal ROS  negative genitourinary   Musculoskeletal negative musculoskeletal ROS (+)   Abdominal   Peds negative pediatric ROS (+)  Hematology negative hematology ROS (+)   Anesthesia Other Findings   Reproductive/Obstetrics negative OB ROS                             Anesthesia Physical Anesthesia Plan  ASA: II  Anesthesia Plan: Spinal   Post-op Pain Management:  Regional for Post-op pain   Induction: Intravenous  PONV Risk Score and Plan: 2 and Ondansetron, Treatment may vary due to age or medical condition, Midazolam and Propofol infusion  Airway Management Planned: Simple Face Mask  Additional Equipment:   Intra-op Plan:   Post-operative Plan:   Informed Consent: I have reviewed the patients History and Physical, chart, labs and discussed the procedure including the risks, benefits and alternatives for the proposed anesthesia with the patient or authorized representative who has indicated his/her understanding and acceptance.   Dental advisory given  Plan Discussed with: CRNA  Anesthesia Plan Comments:         Anesthesia Quick Evaluation

## 2017-10-28 NOTE — Interval H&P Note (Signed)
History and Physical Interval Note:  10/28/2017 7:47 AM  Danielle Gray  has presented today for surgery, with the diagnosis of Degenerative joint disease left knee  The various methods of treatment have been discussed with the patient and family. After consideration of risks, benefits and other options for treatment, the patient has consented to  Procedure(s) with comments: LEFT TOTAL KNEE ARTHROPLASTY WITH COMPUTER NAVIGATION (Left) - Needs RNFA as a surgical intervention .  The patient's history has been reviewed, patient examined, no change in status, stable for surgery.  I have reviewed the patient's chart and labs.  Questions were answered to the patient's satisfaction.     Hilton Cork Mihailo Sage

## 2017-10-28 NOTE — Anesthesia Procedure Notes (Signed)
Anesthesia Regional Block: Adductor canal block   Pre-Anesthetic Checklist: ,, timeout performed, Correct Patient, Correct Site, Correct Laterality, Correct Procedure, Correct Position, site marked, Risks and benefits discussed,  Surgical consent,  Pre-op evaluation,  At surgeon's request and post-op pain management  Laterality: Left and Lower  Prep: Maximum Sterile Barrier Precautions used, chloraprep       Needles:  Injection technique: Single-shot  Needle Type: Echogenic Stimulator Needle     Needle Length: 10cm      Additional Needles:   Procedures:,,,, ultrasound used (permanent image in chart),,,,  Narrative:  Start time: 10/28/2017 7:00 AM End time: 10/28/2017 7:07 AM Injection made incrementally with aspirations every 5 mL.  Performed by: Personally  Anesthesiologist: Montez Hageman, MD  Additional Notes: Risks, benefits and alternative to block explained extensively.  Patient tolerated procedure well, without complications.

## 2017-10-28 NOTE — Anesthesia Procedure Notes (Signed)
Procedure Name: MAC Date/Time: 10/28/2017 7:55 AM Performed by: West Pugh, CRNA Pre-anesthesia Checklist: Patient identified, Emergency Drugs available, Suction available, Patient being monitored and Timeout performed Patient Re-evaluated:Patient Re-evaluated prior to induction Oxygen Delivery Method: Nasal cannula Induction Type: IV induction Placement Confirmation: positive ETCO2 and breath sounds checked- equal and bilateral

## 2017-10-28 NOTE — Op Note (Signed)
OPERATIVE REPORT  SURGEON: Rod Can, MD   ASSISTANT: Nehemiah Massed, PA-C.  PREOPERATIVE DIAGNOSIS: Left knee arthritis.   POSTOPERATIVE DIAGNOSIS: Left knee arthritis.   PROCEDURE: Left total knee arthroplasty.   IMPLANTS: Stryker Triathlon CR femur, size 5. Stryker Tritanium tibia, size 5. X3 polyethelyene insert, size 11 mm, CS. 3 button asymmetric patella, size 32 mm.  ANESTHESIA:  Regional and Spinal  TOURNIQUET TIME: Not utilized.   ESTIMATED BLOOD LOSS:-250 mL    ANTIBIOTICS: 2 g Ancef.  DRAINS: None.  COMPLICATIONS: None   CONDITION: PACU - hemodynamically stable.   BRIEF CLINICAL NOTE: Danielle Gray is a 67 y.o. female with a long-standing history of Left knee arthritis. After failing conservative management, the patient was indicated for total knee arthroplasty. The risks, benefits, and alternatives to the procedure were explained, and the patient elected to proceed.  PROCEDURE IN DETAIL: Adductor canal block was obtained in the pre-op holding area. Once inside the operative room, spinal anesthesia was obtained, and a foley catheter was inserted. The patient was then positioned, a nonsterile tourniquet was placed, and the lower extremity was prepped and draped in the normal sterile surgical fashion. A time-out was called verifying side and site of surgery. The patient received IV antibiotics within 60 minutes of beginning the procedure. The tourniquet was not utilized.  An anterior approach to the knee was performed utilizing a midvastus arthrotomy. A medial release was performed and the patellar fat pad was excised. Stryker navigation was used to cut the distal femur perpendicular to the mechanical axis. A freehand patellar resection was performed, and the patella was sized an prepared with 3 lug holes.  Nagivation was used to make a neutral proximal tibia  resection, taking 9 mm of bone from the less affected lateral side with 3 degrees of slope. The menisci were excised. A spacer block was placed, and the alignment and balance in extension were confirmed.   The distal femur was sized using the 3-degree external rotation guide referencing the posterior femoral cortex. The appropriate 4-in-1 cutting block was pinned into place. Rotation was checked using Whiteside's line, the epicondylar axis, and then confirmed with a spacer block in flexion. The remaining femoral cuts were performed, taking care to protect the MCL.  The tibia was sized and the trial tray was pinned into place. The remaining trail components were inserted. The knee was stable to varus and valgus stress through a full range of motion. The patella tracked centrally, and the PCL was well balanced. The trial components were removed, and the proximal tibial surface was prepared. Final components were impacted into place. The knee was tested for a final time and found to be well balanced.  The wound was copiously irrigated with normal saline with pulse lavage. Marcaine solution was injected into the periarticular soft tissue. The wound was closed in layers using #1 Vicryl and Stratafix for the fascia, 2-0 Vicryl for the subcutaneous fat, 2-0 Monocryl for the deep dermal layer, 3-0 running Monocryl subcuticular Stitch, and Dermabond for the skin. Once the glue was fully dried, an Aquacell Ag and compressive dressing were applied. Tthe patient was transported to the recovery room in stable condition. Sponge, needle, and instrument counts were correct at the end of the case x2. The patient tolerated the procedure well and there were no known complications.  Please note that a surgical assistant was a medical necessity for this procedure in order to perform it in a safe and expeditious manner. Surgical assistant was necessary  to retract the ligaments and vital neurovascular structures to prevent  injury to them and also necessary for proper positioning of the limb to allow for anatomic placement of the prosthesis.

## 2017-10-28 NOTE — Anesthesia Procedure Notes (Signed)
Spinal  Patient location during procedure: OR Staffing Anesthesiologist: Montez Hageman, MD Performed: anesthesiologist  Preanesthetic Checklist Completed: patient identified, site marked, surgical consent, pre-op evaluation, timeout performed, IV checked, risks and benefits discussed and monitors and equipment checked Spinal Block Patient position: sitting Prep: DuraPrep Patient monitoring: heart rate, continuous pulse ox and blood pressure Approach: right paramedian Location: L2-3 Injection technique: single-shot Needle Needle type: Quincke  Needle gauge: 22 G Needle length: 9 cm Additional Notes Expiration date of kit checked and confirmed. Patient tolerated procedure well, without complications.  First pass. 2cm below top of incision scar. Right paramedian. EZ. aprox 6cm depth

## 2017-10-28 NOTE — Anesthesia Postprocedure Evaluation (Signed)
Anesthesia Post Note  Patient: Danielle Gray  Procedure(s) Performed: LEFT TOTAL KNEE ARTHROPLASTY WITH COMPUTER NAVIGATION (Left Knee)     Patient location during evaluation: PACU Anesthesia Type: Spinal Level of consciousness: awake and alert Pain management: pain level controlled Vital Signs Assessment: post-procedure vital signs reviewed and stable Respiratory status: spontaneous breathing and respiratory function stable Cardiovascular status: blood pressure returned to baseline and stable Postop Assessment: no headache, no backache, spinal receding and no apparent nausea or vomiting Anesthetic complications: no    Last Vitals:  Vitals:   10/28/17 1130 10/28/17 1143  BP: 115/79 (!) 120/58  Pulse: 65 63  Resp: 14 15  Temp: 36.9 C 36.4 C  SpO2: 98% 99%    Last Pain:  Vitals:   10/28/17 1143  TempSrc:   PainSc: 0-No pain                 Montez Hageman

## 2017-10-28 NOTE — Transfer of Care (Signed)
Immediate Anesthesia Transfer of Care Note  Patient: Danielle Gray  Procedure(s) Performed: LEFT TOTAL KNEE ARTHROPLASTY WITH COMPUTER NAVIGATION (Left Knee)  Patient Location: PACU  Anesthesia Type:Spinal and MAC combined with regional for post-op pain  Level of Consciousness: awake, alert , oriented and patient cooperative  Airway & Oxygen Therapy: Patient Spontanous Breathing and Patient connected to nasal cannula oxygen  Post-op Assessment: Report given to RN and Post -op Vital signs reviewed and stable  Post vital signs: Reviewed and stable  Last Vitals:  Vitals Value Taken Time  BP    Temp    Pulse 77 10/28/2017 10:44 AM  Resp 15 10/28/2017 10:44 AM  SpO2 97 % 10/28/2017 10:44 AM  Vitals shown include unvalidated device data.  Last Pain:  Vitals:   10/28/17 0602  TempSrc: Oral         Complications: No apparent anesthesia complications

## 2017-10-28 NOTE — Evaluation (Signed)
Physical Therapy Evaluation Patient Details Name: Danielle Gray MRN: [id number] DOB: [birth year] Today's Date: 10/28/2017   History of Present Illness  s/p L TKA; hx R TKA  Clinical Impression  Pt is s/p TKA resulting in the deficits listed below (see PT Problem List). Pt amb 50' with RW, min/guard; should progress well;  Pt will benefit from skilled PT to increase their independence and safety with mobility to allow discharge to the venue listed below.      Follow Up Recommendations Follow surgeon's recommendation for DC plan and follow-up therapies    Equipment Recommendations  None recommended by PT    Recommendations for Other Services       Precautions / Restrictions Precautions Precautions: Fall;Knee Restrictions Weight Bearing Restrictions: No Other Position/Activity Restrictions: WBAT      Mobility  Bed Mobility Overal bed mobility: Needs Assistance Bed Mobility: Supine to Sit     Supine to sit: Min guard     General bed mobility comments: for safety  Transfers Overall transfer level: Needs assistance Equipment used: Rolling walker (2 wheeled) Transfers: Sit to/from Stand              Ambulation/Gait Ambulation/Gait assistance: Min assist;Min guard Gait Distance (Feet): 50 Feet Assistive device: Rolling walker (2 wheeled) Gait Pattern/deviations: Step-to pattern;Decreased weight shift to left     General Gait Details: cues for sequence, RW position and safety  Stairs            Wheelchair Mobility    Modified Rankin (Stroke Patients Only)       Balance                                             Pertinent Vitals/Pain Pain Assessment: 0-10 Pain Score: 5  Pain Location: left knee Pain Descriptors / Indicators: Discomfort;Sore Pain Intervention(s): Limited activity within patient's tolerance;Monitored during session;Premedicated before session;Repositioned    Home Living Family/patient expects to be  discharged to:: Private residence Living Arrangements: Spouse/significant other;Other relatives Available Help at Discharge: Family;Available PRN/intermittently Type of Home: House Home Access: Stairs to enter Entrance Stairs-Rails: Right Entrance Stairs-Number of Steps: 6 Home Layout: One level Home Equipment: Walker - 2 wheels;Cane - single point;Bedside commode      Prior Function Level of Independence: Independent               Hand Dominance        Extremity/Trunk Assessment   Upper Extremity Assessment Upper Extremity Assessment: Overall WFL for tasks assessed    Lower Extremity Assessment Lower Extremity Assessment: LLE deficits/detail LLE Deficits / Details: ankle WFL; knee AAROM grossly -8* to 60*; hip flexion and knee extension 2+/5 to 3/5       Communication   Communication: No difficulties  Cognition Arousal/Alertness: Awake/alert Behavior During Therapy: WFL for tasks assessed/performed Overall Cognitive Status: Within Functional Limits for tasks assessed                                        General Comments      Exercises Total Joint Exercises Ankle Circles/Pumps: AROM;Both;10 reps Quad Sets: AROM;Both;10 reps   Assessment/Plan    PT Assessment Patient needs continued PT services  PT Problem List Decreased strength;Decreased range of motion;Decreased activity tolerance;Decreased mobility;Decreased knowledge of use of DME;Pain  PT Treatment Interventions DME instruction;Gait training;Functional mobility training;Therapeutic activities;Therapeutic exercise;Patient/family education;Stair training    PT Goals (Current goals can be found in the Care Plan section)  Acute Rehab PT Goals Patient Stated Goal: less knee pain PT Goal Formulation: With patient Time For Goal Achievement: 11/04/17 Potential to Achieve Goals: Good    Frequency 7X/week   Barriers to discharge        Co-evaluation                AM-PAC PT "6 Clicks" Daily Activity  Outcome Measure Difficulty turning over in bed (including adjusting bedclothes, sheets and blankets)?: A Little Difficulty moving from lying on back to sitting on the side of the bed? : A Little Difficulty sitting down on and standing up from a chair with arms (e.g., wheelchair, bedside commode, etc,.)?: A Little Help needed moving to and from a bed to chair (including a wheelchair)?: A Little Help needed walking in hospital room?: A Little Help needed climbing 3-5 steps with a railing? : A Lot 6 Click Score: 17    End of Session Equipment Utilized During Treatment: Gait belt Activity Tolerance: Patient tolerated treatment well Patient left: in chair;with call bell/phone within reach;with family/visitor present;with chair alarm set   PT Visit Diagnosis: Difficulty in walking, not elsewhere classified (R26.2)    Time: 2563-8937 PT Time Calculation (min) (ACUTE ONLY): 20 min   Charges:   PT Evaluation $PT Eval Low Complexity: 1 Low     PT G CodesKenyon Ana, PT Pager: [phone] 10/28/2017   [facility] 10/28/2017, 3:46 PM

## 2017-10-28 NOTE — Discharge Instructions (Signed)
° °Dr. Brian Swinteck °Total Joint Specialist °[facility] °[address] °[place], [place] [address] °[phone] ° °TOTAL KNEE REPLACEMENT POSTOPERATIVE DIRECTIONS ° ° ° °Knee Rehabilitation, Guidelines Following Surgery  °Results after knee surgery are often greatly improved when you follow the exercise, range of motion and muscle strengthening exercises prescribed by your doctor. Safety measures are also important to protect the knee from further injury. Any time any of these exercises cause you to have increased pain or swelling in your knee joint, decrease the amount until you are comfortable again and slowly increase them. If you have problems or questions, call your caregiver or physical therapist for advice.  ° °WEIGHT BEARING °Weight bearing as tolerated with assist device (walker, cane, etc) as directed, use it as long as suggested by your surgeon or therapist, typically at least 4-6 weeks. ° °HOME CARE INSTRUCTIONS  °Remove items at home which could result in a fall. This includes throw rugs or furniture in walking pathways.  °Continue medications as instructed at time of discharge. °You may have some home medications which will be placed on hold until you complete the course of blood thinner medication.  °You may start showering once you are discharged home but do not submerge the incision under water. Just pat the incision dry and apply a dry gauze dressing on daily. °Walk with walker as instructed.  °You may resume a sexual relationship in one month or when given the OK by your doctor.  °· Use walker as long as suggested by your caregivers. °· Avoid periods of inactivity such as sitting longer than an hour when not asleep. This helps prevent blood clots.  °You may put full weight on your legs and walk as much as is comfortable.  °You may return to work once you are cleared by your doctor.  °Do not drive a car for 6 weeks or until released by you surgeon.  °· Do not drive  while taking narcotics.  °Wear the elastic stockings for three weeks following surgery during the day but you may remove then at night. °Make sure you keep all of your appointments after your operation with all of your doctors and caregivers. You should call the office at the above phone number and make an appointment for approximately two weeks after the date of your surgery. °Do not remove your surgical dressing. The dressing is waterproof; you may take showers in 3 days, but do not take tub baths or submerge the dressing. °Please pick up a stool softener and laxative for home use as long as you are requiring pain medications. °· ICE to the affected knee every three hours for 30 minutes at a time and then as needed for pain and swelling.  Continue to use ice on the knee for pain and swelling from surgery. You may notice swelling that will progress down to the foot and ankle.  This is normal after surgery.  Elevate the leg when you are not up walking on it.   °It is important for you to complete the blood thinner medication as prescribed by your doctor. °· Continue to use the breathing machine which will help keep your temperature down.  It is common for your temperature to cycle up and down following surgery, especially at night when you are not up moving around and exerting yourself.  The breathing machine keeps your lungs expanded and your temperature down. ° °RANGE OF MOTION AND STRENGTHENING EXERCISES  °Rehabilitation of the knee is important following   a knee injury or an operation. After just a few days of immobilization, the muscles of the thigh which control the knee become weakened and shrink (atrophy). Knee exercises are designed to build up the tone and strength of the thigh muscles and to improve knee motion. Often times heat used for twenty to thirty minutes before working out will loosen up your tissues and help with improving the range of motion but do not use heat for the first two weeks following  surgery. These exercises can be done on a training (exercise) mat, on the floor, on a table or on a bed. Use what ever works the best and is most comfortable for you Knee exercises include:  °Leg Lifts - While your knee is still immobilized in a splint or cast, you can do straight leg raises. Lift the leg to 60 degrees, hold for 3 sec, and slowly lower the leg. Repeat 10-20 times 2-3 times daily. Perform this exercise against resistance later as your knee gets better.  °Quad and Hamstring Sets - Tighten up the muscle on the front of the thigh (Quad) and hold for 5-10 sec. Repeat this 10-20 times hourly. Hamstring sets are done by pushing the foot backward against an object and holding for 5-10 sec. Repeat as with quad sets.  °A rehabilitation program following serious knee injuries can speed recovery and prevent re-injury in the future due to weakened muscles. Contact your doctor or a physical therapist for more information on knee rehabilitation.  ° °SKILLED REHAB INSTRUCTIONS: °If the patient is transferred to a skilled rehab facility following release from the hospital, a list of the current medications will be sent to the facility for the patient to continue.  When discharged from the skilled rehab facility, please have the facility set up the patient's Home Health Physical Therapy prior to being released. Also, the skilled facility will be responsible for providing the patient with their medications at time of release from the facility to include their pain medication, the muscle relaxants, and their blood thinner medication. If the patient is still at the rehab facility at time of the two week follow up appointment, the skilled rehab facility will also need to assist the patient in arranging follow up appointment in our office and any transportation needs. ° °MAKE SURE YOU:  °Understand these instructions.  °Will watch your condition.  °Will get help right away if you are not doing well or get worse.   ° ° °Pick up stool softner and laxative for home use following surgery while on pain medications. °Do NOT remove your dressing. You may shower.  °Do not take tub baths or submerge incision under water. °May shower starting three days after surgery. °Please use a clean towel to pat the incision dry following showers. °Continue to use ice for pain and swelling after surgery. °Do not use any lotions or creams on the incision until instructed by your surgeon. ° ° °Information on my medicine - ELIQUIS® (apixaban) ° °Why was Eliquis® prescribed for you? °Eliquis® was prescribed for you to reduce the risk of blood clots forming after orthopedic surgery.   ° °What do You need to know about Eliquis®? °Take your Eliquis® TWICE DAILY - one tablet in the morning and one tablet in the evening with or without food.  It would be best to take the dose about the same time each day. ° °If you have difficulty swallowing the tablet whole please discuss with your pharmacist how to take the medication   safely. ° °Take Eliquis® exactly as prescribed by your doctor and DO NOT stop taking Eliquis® without talking to the doctor who prescribed the medication.  Stopping without other medication to take the place of Eliquis® may increase your risk of developing a clot. ° °After discharge, you should have regular check-up appointments with your healthcare provider that is prescribing your Eliquis®. ° °What do you do if you miss a dose? °If a dose of ELIQUIS® is not taken at the scheduled time, take it as soon as possible on the same day and twice-daily administration should be resumed.  The dose should not be doubled to make up for a missed dose.  Do not take more than one tablet of ELIQUIS at the same time. ° °Important Safety Information °A possible side effect of Eliquis® is bleeding. You should call your healthcare provider right away if you experience any of the following: °? Bleeding from an injury or your nose that does not  stop. °? Unusual colored urine (red or dark brown) or unusual colored stools (red or black). °? Unusual bruising for unknown reasons. °? A serious fall or if you hit your head (even if there is no bleeding). ° °Some medicines may interact with Eliquis® and might increase your risk of bleeding or clotting while on Eliquis®. To help avoid this, consult your healthcare provider or pharmacist prior to using any new prescription or non-prescription medications, including herbals, vitamins, non-steroidal anti-inflammatory drugs (NSAIDs) and supplements. ° °This website has more information on Eliquis® (apixaban): http://www.eliquis.com/eliquis/home °

## 2017-10-29 ENCOUNTER — Encounter (HOSPITAL_COMMUNITY): Payer: Self-pay | Admitting: Orthopedic Surgery

## 2017-10-29 LAB — BASIC METABOLIC PANEL
Anion gap: 4 — ABNORMAL LOW (ref 5–15)
BUN: 12 mg/dL (ref 8–23)
CO2: 26 mmol/L (ref 22–32)
CREATININE: 0.59 mg/dL (ref 0.44–1.00)
Calcium: 8.5 mg/dL — ABNORMAL LOW (ref 8.9–10.3)
Chloride: 112 mmol/L — ABNORMAL HIGH (ref 98–111)
Glucose, Bld: 137 mg/dL — ABNORMAL HIGH (ref 70–99)
POTASSIUM: 4.5 mmol/L (ref 3.5–5.1)
SODIUM: 142 mmol/L (ref 135–145)

## 2017-10-29 LAB — CBC
HCT: 29.7 % — ABNORMAL LOW (ref 36.0–46.0)
Hemoglobin: 9.9 g/dL — ABNORMAL LOW (ref 12.0–15.0)
MCH: 30.7 pg (ref 26.0–34.0)
MCHC: 33.3 g/dL (ref 30.0–36.0)
MCV: 92 fL (ref 78.0–100.0)
Platelets: 181 10*3/uL (ref 150–400)
RBC: 3.23 MIL/uL — AB (ref 3.87–5.11)
RDW: 13.4 % (ref 11.5–15.5)
WBC: 14.7 10*3/uL — AB (ref 4.0–10.5)

## 2017-10-29 MED ORDER — APIXABAN 2.5 MG PO TABS: 2.5000 mg | ORAL_TABLET | Freq: Two times a day (BID) | ORAL | 0 refills | Status: DC

## 2017-10-29 MED ORDER — ONDANSETRON HCL 4 MG PO TABS: 4.0000 mg | ORAL_TABLET | Freq: Four times a day (QID) | ORAL | 0 refills | Status: DC | PRN

## 2017-10-29 MED ORDER — HYDROCODONE-ACETAMINOPHEN 5-325 MG PO TABS: 1.0000 | ORAL_TABLET | Freq: Four times a day (QID) | ORAL | 0 refills | Status: DC | PRN

## 2017-10-29 MED ORDER — DOCUSATE SODIUM 100 MG PO CAPS: 100.0000 mg | ORAL_CAPSULE | Freq: Two times a day (BID) | ORAL | 1 refills | Status: DC

## 2017-10-29 NOTE — Progress Notes (Signed)
Physical Therapy Treatment Patient Details Name: Danielle Gray MRN: [id number] DOB: 1950/12/10 Today's Date: 10/29/2017    History of Present Illness s/p L TKA    PT Comments    Pt progressing well; feels ready for d/c home today   Follow Up Recommendations  Follow surgeon's recommendation for DC plan and follow-up therapies     Equipment Recommendations  None recommended by PT    Recommendations for Other Services       Precautions / Restrictions Precautions Precautions: Fall;Knee Restrictions Weight Bearing Restrictions: No Other Position/Activity Restrictions: WBAT    Mobility  Bed Mobility Overal bed mobility: Needs Assistance Bed Mobility: Supine to Sit     Supine to sit: Supervision     General bed mobility comments: for safety  Transfers Overall transfer level: Needs assistance Equipment used: Rolling walker (2 wheeled) Transfers: Sit to/from Stand Sit to Stand: Supervision;Modified independent (Device/Increase time)         General transfer comment: pt self corrects for hand placement  Ambulation/Gait Ambulation/Gait assistance: Min guard;Supervision Gait Distance (Feet): 100 Feet Assistive device: Rolling walker (2 wheeled) Gait Pattern/deviations: Step-to pattern;Step-through pattern     General Gait Details: cues for gait progression   Stairs Stairs: Yes Stairs assistance: Min guard Stair Management: One rail Right;One rail Left;Step to pattern;Forwards Number of Stairs: 5 General stair comments: cues for sequence and safe technique   Wheelchair Mobility    Modified Rankin (Stroke Patients Only)       Balance                                            Cognition Arousal/Alertness: Awake/alert Behavior During Therapy: WFL for tasks assessed/performed Overall Cognitive Status: Within Functional Limits for tasks assessed                                        Exercises Total Joint  Exercises Ankle Circles/Pumps: AROM;Both;10 reps Quad Sets: AROM;Both;10 reps Heel Slides: AAROM;AROM;Left;10 reps Hip ABduction/ADduction: AROM;Left;10 reps Straight Leg Raises: AROM;Left;10 reps Knee Flexion: AAROM;5 reps;Left;Seated Goniometric ROM: 6* to 85* AAROM L knee flexion    General Comments        Pertinent Vitals/Pain Pain Assessment: 0-10 Pain Score: 2  Pain Location: left knee Pain Descriptors / Indicators: Discomfort;Sore Pain Intervention(s): Limited activity within patient's tolerance;Monitored during session;Premedicated before session;Ice applied    Home Living                      Prior Function            PT Goals (current goals can now be found in the care plan section) Acute Rehab PT Goals Patient Stated Goal: less knee pain PT Goal Formulation: With patient Time For Goal Achievement: 11/04/17 Potential to Achieve Goals: Good Progress towards PT goals: Progressing toward goals    Frequency    7X/week      PT Plan Current plan remains appropriate    Co-evaluation              AM-PAC PT "6 Clicks" Daily Activity  Outcome Measure  Difficulty turning over in bed (including adjusting bedclothes, sheets and blankets)?: A Little Difficulty moving from lying on back to sitting on the side of the bed? : A Little Difficulty sitting  down on and standing up from a chair with arms (e.g., wheelchair, bedside commode, etc,.)?: A Little Help needed moving to and from a bed to chair (including a wheelchair)?: A Little Help needed walking in hospital room?: A Little Help needed climbing 3-5 steps with a railing? : A Little 6 Click Score: 18    End of Session Equipment Utilized During Treatment: Gait belt Activity Tolerance: Patient tolerated treatment well Patient left: in chair;with call bell/phone within reach;with family/visitor present;with chair alarm set   PT Visit Diagnosis: Difficulty in walking, not elsewhere classified  (R26.2)     Time: 4656-8127 PT Time Calculation (min) (ACUTE ONLY): 21 min  Charges:  $Gait Training: 8-22 mins                    G Codes:          Danielle Gray 17-Nov-2017, 11:56 AM

## 2017-10-29 NOTE — Discharge Summary (Signed)
Physician Discharge Summary  Patient ID: Danielle Gray MRN: [id number] DOB/AGE: [birth year] 67 y.o.  Admit date: 10/28/2017 Discharge date: 10/29/2017  Admission Diagnoses:  Osteoarthritis of left knee  Discharge Diagnoses:  Principal Problem:   Osteoarthritis of left knee   Past Medical History:  Diagnosis Date  . Arthritis   . Back pain   . GERD (gastroesophageal reflux disease)   . 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 Hypertension    changed diet, weight loss, no longer prescribed medication  . Lumbar herniated disc   . Spinal stenosis   . Spondylosis     Surgeries: Procedure(s): LEFT TOTAL KNEE ARTHROPLASTY WITH COMPUTER NAVIGATION on 10/28/2017   Consultants (if any):   Discharged Condition: Improved  Hospital Course: Danielle Gray is an 67 y.o. female who was admitted 10/28/2017 with a diagnosis of Osteoarthritis of left knee and went to the operating room on 10/28/2017 and underwent the above named procedures.    She was given perioperative antibiotics:  Anti-infectives (From admission, onward)   Start     Dose/Rate Route Frequency Ordered Stop   10/28/17 1400  ceFAZolin (ANCEF) IVPB 2g/100 mL premix     2 g 200 mL/hr over 30 Minutes Intravenous Every 6 hours 10/28/17 1138 10/28/17 2125   10/28/17 0754  ceFAZolin (ANCEF) 2-4 GM/100ML-% IVPB    Note to Pharmacy:  Jovita Gamma   : cabinet override      10/28/17 0754 10/28/17 0759   10/28/17 0630  ceFAZolin (ANCEF) IVPB 2g/100 mL premix     2 g 200 mL/hr over 30 Minutes Intravenous On call to O.R. 10/28/17 9675 10/28/17 0829    .  She was given sequential compression devices, early ambulation, and apixaban for DVT prophylaxis.  She benefited maximally from the hospital stay and there were no complications.    Recent vital signs:  Vitals:   10/29/17 0147 10/29/17 0548  BP: 132/61 (!) 142/62  Pulse: 78 72  Resp: 17 16  Temp: 98 F (36.7 C) 97.9 F (36.6 C)  SpO2: 97% 97%     Recent laboratory studies:  Lab Results  Component Value Date   HGB 9.9 (L) 10/29/2017   HGB 13.7 10/19/2017   HGB 11.3 (L) 12/04/2016   Lab Results  Component Value Date   WBC 14.7 (H) 10/29/2017   PLT 181 10/29/2017   Lab Results  Component Value Date   INR 1.00 03/17/2016   Lab Results  Component Value Date   NA 142 10/29/2017   K 4.5 10/29/2017   CL 112 (H) 10/29/2017   CO2 26 10/29/2017   BUN 12 10/29/2017   CREATININE 0.59 10/29/2017   GLUCOSE 137 (H) 10/29/2017    Discharge Medications:   Allergies as of 10/29/2017   No Known Allergies     Medication List    STOP taking these medications   ibuprofen 800 MG tablet Commonly known as:  ADVIL,MOTRIN     TAKE these medications   apixaban 2.5 MG Tabs tablet Commonly known as:  ELIQUIS Take 1 tablet (2.5 mg total) by mouth every 12 (twelve) hours.   cholecalciferol 1000 units tablet Commonly known as:  VITAMIN D Take 1,000 Units by mouth daily.   COQ-10 PO Take 1 tablet by mouth every other day.   docusate sodium 100 MG capsule Commonly known as:  COLACE Take 1 capsule (100 mg total) by mouth 2 (two) times daily.   HYDROcodone-acetaminophen 5-325 MG  tablet Commonly known as:  NORCO/VICODIN Take 1-2 tablets by mouth every 6 (six) hours as needed.   ondansetron 4 MG tablet Commonly known as:  ZOFRAN Take 1 tablet (4 mg total) by mouth every 6 (six) hours as needed for nausea.   vitamin B-12 1000 MCG tablet Commonly known as:  CYANOCOBALAMIN Take 1,000 mcg by mouth daily.       Diagnostic Studies: Dg Knee Left Port  Result Date: 10/28/2017 CLINICAL DATA:  67 year old female post left knee replacement. Initial encounter. EXAM: PORTABLE LEFT KNEE - 1-2 VIEW COMPARISON:  None. FINDINGS: Post total left knee replacement which appears in satisfactory position without complication noted. IMPRESSION: Post total left knee replacement. Electronically Signed   By: Genia Del M.D.   On: 10/28/2017  11:38    Disposition: Discharge disposition: 01-Home or Self Care       Discharge Instructions    Call MD / Call 911   Complete by:  As directed    If you experience chest pain or shortness of breath, CALL 911 and be transported to the hospital emergency room.  If you develope a fever above 101 F, pus (white drainage) or increased drainage or redness at the wound, or calf pain, call your surgeon's office.   Constipation Prevention   Complete by:  As directed    Drink plenty of fluids.  Prune juice may be helpful.  You may use a stool softener, such as Colace (over the counter) 100 mg twice a day.  Use MiraLax (over the counter) for constipation as needed.   Diet - low sodium heart healthy   Complete by:  As directed    Do not put a pillow under the knee. Place it under the heel.   Complete by:  As directed    Driving restrictions   Complete by:  As directed    No driving for 6 weeks   Increase activity slowly as tolerated   Complete by:  As directed    Lifting restrictions   Complete by:  As directed    No lifting for 6 weeks   TED hose   Complete by:  As directed    Use stockings (TED hose) for 2 weeks on both leg(s).  You may remove them at night for sleeping.      Follow-up Information    Paulino Cork, Aaron Edelman, MD. Schedule an appointment as soon as possible for a visit in 2 weeks.   Specialty:  Orthopedic Surgery Why:  For wound re-check Contact information: [address] [place] [address] [phone]            Signed: Hilton Cork Daison Braxton 10/29/2017, 9:18 AM

## 2017-10-29 NOTE — Progress Notes (Signed)
    Subjective:  Patient reports pain as moderate to severe.  Denies N/V/CP/SOB. No c/o.  Objective:   VITALS:   Vitals:   10/28/17 1649 10/28/17 2140 10/29/17 0147 10/29/17 0548  BP: (!) 131/54 114/62 132/61 (!) 142/62  Pulse: 72 77 78 72  Resp: 18 16 17 16   Temp: (!) 97.5 F (36.4 C) 97.8 F (36.6 C) 98 F (36.7 C) 97.9 F (36.6 C)  TempSrc: Oral Oral Oral Oral  SpO2: 96% 97% 97% 97%  Weight:      Height:        NAD ABD soft Sensation intact distally Intact pulses distally Dorsiflexion/Plantar flexion intact Incision: dressing C/D/I Compartment soft   Lab Results  Component Value Date   WBC 14.7 (H) 10/29/2017   HGB 9.9 (L) 10/29/2017   HCT 29.7 (L) 10/29/2017   MCV 92.0 10/29/2017   PLT 181 10/29/2017   BMET    Component Value Date/Time   NA 142 10/29/2017 0535   K 4.5 10/29/2017 0535   CL 112 (H) 10/29/2017 0535   CO2 26 10/29/2017 0535   GLUCOSE 137 (H) 10/29/2017 0535   BUN 12 10/29/2017 0535   CREATININE 0.59 10/29/2017 0535   CALCIUM 8.5 (L) 10/29/2017 0535   GFRNONAA >60 10/29/2017 0535   GFRAA >60 10/29/2017 0535     Assessment/Plan: 1 Day Post-Op   Principal Problem:   Osteoarthritis of left knee   WBAT with walker DVT ppx: apixaban, SCDs, TEDS PO pain control PT/OT Dispo: D/C home with outpatient PT    Danielle Gray 10/29/2017, 9:16 AM   Rod Can, MD Cell [phone]

## 2017-11-02 ENCOUNTER — Other Ambulatory Visit: Payer: Self-pay | Admitting: *Deleted

## 2017-11-02 NOTE — Patient Outreach (Addendum)
[facility]) Care Management  11/02/2017  Danielle Gray [birth year] [id number]   Subjective: Telephone call to patient's home / mobile number, spoke with patient, and HIPAA verified.  Discussed [facility] Care Management [employer] Transition of care follow up, patient voiced understanding, and is in agreement to follow up.  Patient states she is doing fine, is performing home exercise program without difficulty, provider's office is working with Christella Scheuermann to set up outpatient physical therapy when appropriate, and has a follow up appointment with surgeon on 11/12/17.   Patient states she is able to manage self care and has assistance as needed with activities of daily living / home management.  Patient voices understanding of medical diagnosis, surgery, and treatment plan. States she is accessing her [employer]ella Scheuermann benefits as needed via member services number on back of card or through [email or web address]  Patient states she does not have any education material, transition of care, care coordination, disease management, disease monitoring, transportation, community resource, or pharmacy needs at this time. States she is very appreciative of the follow up and is in agreement to receive [place] Management information.      Objective: Per Sunny Schlein and chart review, patient hospitalized 10/28/17 -10/29/17 for  Osteoarthritis of left knee, and status post Left total knee arthroplasty on 10/28/17.    Patient hospitalized 12/03/16 - 12/04/16 for Osteoarthritis of right hip.   Status post Righttotal hip arthroplasty, anterior approach on 12/03/16.  Patient also hospitalized 03/26/16 - 03/27/16 for Primary osteoarthritis of left hip.  Status post Lefttotal hip arthroplasty, anterior approach on 03/26/16.  Patient has a history of hypertension, lumbar herniated disc, and DVT.   [facility] Care Management completed transition of care follow up on 12/10/16.     Assessment: Received [employer] Transition of care referral on  11/01/17.  Transition of care follow up completed, no care management needs, and will proceed with case closure.      Plan: RNCM will send patient successful outreach letter, [facility] pamphlet, and magnet. RNCM will complete case closure due to follow up completed / no care management needs.      Danielle Vida H. Annia Friendly, BSN, [place] Management [facility] Telephonic CM Phone: [phone] Fax: [phone]

## 2017-11-09 ENCOUNTER — Ambulatory Visit (HOSPITAL_COMMUNITY): Payer: Managed Care, Other (non HMO) | Attending: Orthopedic Surgery | Admitting: Physical Therapy

## 2017-11-09 ENCOUNTER — Other Ambulatory Visit: Payer: Self-pay

## 2017-11-09 ENCOUNTER — Encounter (HOSPITAL_COMMUNITY): Payer: Self-pay | Admitting: Physical Therapy

## 2017-11-09 DIAGNOSIS — R2689 Other abnormalities of gait and mobility: Secondary | ICD-10-CM

## 2017-11-09 DIAGNOSIS — R29898 Other symptoms and signs involving the musculoskeletal system: Secondary | ICD-10-CM | POA: Diagnosis present

## 2017-11-09 DIAGNOSIS — M25562 Pain in left knee: Secondary | ICD-10-CM

## 2017-11-09 DIAGNOSIS — M6281 Muscle weakness (generalized): Secondary | ICD-10-CM | POA: Diagnosis present

## 2017-11-09 DIAGNOSIS — M25662 Stiffness of left knee, not elsewhere classified: Secondary | ICD-10-CM | POA: Diagnosis present

## 2017-11-09 NOTE — Therapy (Signed)
[place], [place], [address] Phone: [phone]   Fax:  [phone]  Physical Therapy Evaluation  Patient Details  Name: Danielle Gray MRN: [id number] Date of Birth: 1950-06-19 Referring Provider: Rod Can, MD   Encounter Date: 11/09/2017  PT End of Session - 11/09/17 1123    Visit Number  1    Number of Visits  19    Date for PT Re-Evaluation  12/21/17 Mini-reassess 11/30/17    Authorization Type  [employer] managed (based on medical necessity)    Authorization Time Period  11/09/17 - 12/21/17    Authorization - Visit Number  1    Authorization - Number of Visits  23    PT Start Time  0905    PT Stop Time  0943    PT Time Calculation (min)  38 min    Equipment Utilized During Treatment  Gait belt    Activity Tolerance  Patient tolerated treatment well    Behavior During Therapy  [facility] for tasks assessed/performed       Past Medical History:  Diagnosis Date  . Arthritis   . Back pain   . GERD (gastroesophageal reflux disease)   . 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 Hypertension    changed diet, weight loss, no longer prescribed medication  . Lumbar herniated disc   . Spinal stenosis   . Spondylosis     Past Surgical History:  Procedure Laterality Date  . BLADDER TACK  25 YRS AGO  . BREAST BIOPSY    . colonscopy     . JOINT REPLACEMENT  2006   RT TOTAL KNEE  . KNEE ARTHROPLASTY Left 10/28/2017   Procedure: LEFT TOTAL KNEE ARTHROPLASTY WITH COMPUTER NAVIGATION;  Surgeon: Rod Can, MD;  Location: [facility];  Service: Orthopedics;  Laterality: Left;  Needs RNFA  . LUMBAR LAMINECTOMY/DECOMPRESSION MICRODISCECTOMY Left 05/22/2015   Procedure: CENTRAL DECOMPRESSION LUMBAR LAMINECTOMY L3-4 AND L4-5, 2 LEVEL SPINAL STENOSIS WITH MICRODISCECTOMY L3-4 ON LEFT AND FORAMINOTOMY L3-4 AND L4-5 LEFT;  Surgeon: Latanya Maudlin, MD;  Location: [facility];  Service: Orthopedics;  Laterality: Left;  .  TOTAL HIP ARTHROPLASTY Left 03/26/2016   Procedure: LEFT TOTAL HIP ARTHROPLASTY ANTERIOR APPROACH;  Surgeon: Rod Can, MD;  Location: [facility];  Service: Orthopedics;  Laterality: Left;  [place] RNFA  . TOTAL HIP ARTHROPLASTY Right 12/03/2016   Procedure: RIGHT TOTAL HIP ARTHROPLASTY ANTERIOR APPROACH;  Surgeon: Rod Can, MD;  Location: [facility];  Service: Orthopedics;  Laterality: Right;  Needs RNFA  . TOTAL KNEE ARTHROPLASTY Right    Dr. Theda Sers  . TUBAL LIGATION    . UPPER GI ENDOSCOPY      There were no vitals filed for this visit.   Subjective Assessment - 11/09/17 0912    Subjective  Patient reported that on 10/28/17 she had a left total knee arthroplasty. Patient reported that she did not have physical therapy at home following this, but she did get exercises at the hospital that she has been doing on her own. Patient reported that she had right THA on 12/03/16, Left THA on 03/26/16, and back surgery in 2017, as well as right TKA 13 years ago. Patient reported some tingling and numbness that comes and goes but none currently. Patient denied any bowel and bladder issues.     Pertinent History  Left TKA 10/28/17, Right THA 12/03/16, Left THA 03/26/16, Central decompression lumbar laminectomy 2017, Right  TKA 13 years ago    Limitations  Sitting;Standing;Walking;House hold activities    How long can you sit comfortably?  10 minutes    How long can you stand comfortably?  5-10 minutes    How long can you walk comfortably?  5-10 minutes    Diagnostic tests  X-ray of left knee 10/28/17: Post total left knee replacement which appears in satisfactory position without complication noted    Patient Stated Goals  Get back to being able to walk without pain    Currently in Pain?  Yes    Pain Score  4     Pain Location  Knee    Pain Orientation  Left    Pain Descriptors / Indicators  Aching    Pain Type  Surgical pain    Pain Onset  1 to 4 weeks ago    Pain Frequency  Intermittent    Aggravating  Factors   Walking, standing    Pain Relieving Factors  Iceing, and stretched out    Effect of Pain on Daily Activities  Moderately affects    Multiple Pain Sites  No         [facility] PT Assessment - 11/09/17 0001      Assessment   Medical Diagnosis  Presence of Left artificial knee joint    Referring Provider  Rod Can, MD    Onset Date/Surgical Date  10/28/17    Next MD Visit  11/12/17    Prior Therapy  Yes, for back, left hip       Restrictions   Weight Bearing Restrictions  Yes    LLE Weight Bearing  Weight bearing as tolerated      Balance Screen   Has the patient fallen in the past 6 months  No    Has the patient had a decrease in activity level because of a fear of falling?   Yes    Is the patient reluctant to leave their home because of a fear of falling?   No      Home Environment   Living Environment  Private residence    Living Arrangements  Spouse/significant other    Type of [place] to enter    Entrance Stairs-Number of Steps  6    Entrance Stairs-Rails  Right going up    [place]  One level    [place] - 2 wheels;Cane - single point      Prior Function   Level of Independence  Independent;Independent with basic ADLs    Vocation  Full time employment    Vocation Requirements  Walking, up and down steps    Leisure  [employer]   Overall Cognitive Status  Within Functional Limits for tasks assessed      Observation/Other Assessments   Focus on Therapeutic Outcomes (FOTO)   46% (54% limited)      Observation/Other Assessments-Edema    Edema  Circumferential      Circumferential Edema   Circumferential - Right  46.5 cm over leggings at joint line    Circumferential - Left   51.5 cm over leggings at joint line      Sensation   Light Touch  Not tested Patient denied any current tingling or numbness      ROM / Strength   AROM / PROM / Strength  Strength;AROM      AROM   AROM Assessment Site  Knee    Right/Left Knee  Left;Right    Right Knee Extension  1    Right Knee Flexion  110    Left Knee Extension  5    Left Knee Flexion  80      Strength   Strength Assessment Site  Hip;Knee;Ankle    Right/Left Hip  Right;Left    Right Hip Flexion  4+/5    Right Hip Extension  4/5    Right Hip ABduction  4+/5    Left Hip Flexion  4/5    Left Hip Extension  3+/5    Left Hip ABduction  4+/5    Right/Left Knee  Right;Left    Right Knee Flexion  5/5    Right Knee Extension  5/5    Left Knee Flexion  4+/5    Left Knee Extension  4+/5    Right/Left Ankle  Right;Left    Right Ankle Dorsiflexion  5/5    Left Ankle Dorsiflexion  4+/5      Palpation   Palpation comment  Patient reported slight tenderness surrounding left knee joint      Ambulation/Gait   Ambulation/Gait  Yes    Ambulation Distance (Feet)  452 Feet 3MWT    Assistive device  Rolling walker    Gait Pattern  Decreased hip/knee flexion - left;Decreased dorsiflexion - left;Antalgic    Ambulation Surface  Level    Gait velocity  0.76 m/s    Stairs  Yes    Stair Management Technique  One rail Right;Other (comment);Step to pattern Going up    Number of Stairs  8    Height of Stairs  6 inches    Gait Comments  Patient ascended stairs with step-to pattern leading with the right and descended stairs leading with the left      Static Standing Balance   Static Standing - Balance Support  No upper extremity supported    Static Standing Balance -  Activities   Single Leg Stance - Right Leg;Single Leg Stance - Left Leg    Static Standing - Comment/# of Minutes  Firm surface SLS Right 6.10 seconds, SLS Left 1.30 seconds      Standardized Balance Assessment   Standardized Balance Assessment  --    Five times sit to stand comments   --                Objective measurements completed on examination: See above findings.              PT Education - 11/09/17 1122    Education Details  Discussed examination  findings, plan of care, educated patient on iceing for no more than 20 minutes at a time, and to continue exercises she has been doing at home, but to bring them in to the next session to modify and update them.     Person(s) Educated  Patient    Methods  Explanation    Comprehension  Verbalized understanding       PT Short Term Goals - 11/09/17 1132      PT SHORT TERM GOAL #1   Title  Patient will demonstrate understanding and report regular compliance with HEP in order to improve knee mobility, lower extremity strength, and overall functional mobility.     Time  3    Period  Weeks    Status  New    Target Date  11/30/17      PT SHORT TERM GOAL #2   Title  Patient  will demonstrate improvement of 1/2 MMT grade in all deficient muscle groups to assist patient with improved mechanics with gait and stair ambulation.     Time  3    Period  Weeks    Status  New    Target Date  11/30/17      PT SHORT TERM GOAL #3   Title  Patient will demonstrate ability to maintain single limb stance for at least 3 seconds on the left lower extremity in order to assist with improved safety and independence with stair ambulation.     Time  3    Period  Weeks    Status  New    Target Date  11/30/17      PT SHORT TERM GOAL #4   Title  Patient will demonstrate left knee extension/flexion active range of motion of at least 0-95 degrees to assist with more normalized gait pattern and stair ambulation.    Time  3    Period  Weeks    Status  New    Target Date  11/30/17        PT Long Term Goals - 11/09/17 1137      PT LONG TERM GOAL #1   Title  Patient will demonstrate left knee extension/flexion active range of motion of at least 0-115 degrees to assist with more normalized gait pattern and stair ambulation.    Time  6    Period  Weeks    Status  New    Target Date  12/21/17      PT LONG TERM GOAL #2   Title  Patient will demonstrate ability to ambulate at a gait velocity of at least 1.2 m/s on  the 3MWT with LRAD to improve patient's safety with ambulation at home and in the community.     Time  6    Period  Weeks    Status  New    Target Date  12/21/17      PT LONG TERM GOAL #3   Title  Patient will demonstrate ability to ascend and descend 8 stairs with reciprocal gait pattern indicating improved safety and confidence with stair ambulation for patient's stairs at home.     Time  6    Period  Weeks    Status  New    Target Date  12/21/17      PT LONG TERM GOAL #4   Title  Patient will demonstrate at least 4+/5 MMT strength in all tested muscle groups in order to improve patient's ease of completing work activities and help patient return to PLOF.     Time  6    Period  Weeks    Status  New    Target Date  12/21/17             Plan - 11/09/17 1200    Clinical Impression Statement  Patient is a 67 year old female who presented to physical therapy status-post left total knee arthroplasty on 10/28/17. Patient reported some pain in her left knee following surgery. Upon examination, noted significant decrease in left knee AROM compared to the opposite side. Also noted decreased strength in bilateral lower extremities. With circumferential measurement noted a significant increase in circumference of the left knee when compared to the right knee indicating edema. Patient demonstrated decreased balance with single leg stance. With stair ambulation patient demonstrated a step-to gait pattern. Also, with the 3 minute walk test patient ambulated at a decreased gait velocity with a rolling  walker which she did not use prior to surgery. Patient would benefit from skilled physical therapy in order to progress patient towards functional goals and return to prior level of function.     History and Personal Factors relevant to plan of care:  Left TKA 10/28/17, Right THA 12/03/16, Left THA 03/26/16, Central decompression lumbar laminectomy 2017, Right TKA 13 years ago    Clinical Presentation   Stable    Clinical Presentation due to:  FOTO, MMT, ROM, circumferential measurement, 3MWT, and clinical judgement    Clinical Decision Making  Low    Rehab Potential  Good    Clinical Impairments Affecting Rehab Potential  Positive: Social support, positive response from therapy previously. Negative: Multiple lower extremity surgeries    PT Frequency  3x / week    PT Duration  6 weeks    PT Treatment/Interventions  ADLs/Self Care Home Management;Aquatic Therapy;Cryotherapy;Moist Heat;DME Instruction;Gait training;Stair training;Functional mobility training;Therapeutic activities;Therapeutic exercise;Balance training;Neuromuscular re-education;Patient/family education;Manual techniques;Manual lymph drainage;Scar mobilization;Passive range of motion;Dry needling;Energy conservation;Taping    PT Next Visit Plan  Review Evaluation and goals, ask education/ab. questions, review patient's HEP she has been doing and update as needed, focus on left knee AROM initially and edema reduction and progress to functional strengthening and balance as able    PT Home Exercise Plan  Review exercises with patient she has been doing at first session    Consulted and Agree with Plan of Care  Patient       Patient will benefit from skilled therapeutic intervention in order to improve the following deficits and impairments:  Abnormal gait, Decreased balance, Decreased endurance, Decreased mobility, Difficulty walking, Hypomobility, Decreased range of motion, Decreased scar mobility, Increased edema, Improper body mechanics, Decreased activity tolerance, Decreased strength, Increased fascial restricitons, Impaired flexibility, Pain  Visit Diagnosis: Acute pain of left knee  Muscle weakness (generalized)  Other abnormalities of gait and mobility  Other symptoms and signs involving the musculoskeletal system  Stiffness of left knee, not elsewhere classified     Problem List Patient Active Problem List    Diagnosis Date Noted  . Osteoarthritis of left knee 10/28/2017  . Primary osteoarthritis of right hip 12/03/2016  . Osteoarthritis of right hip 12/03/2016  . Spinal stenosis, lumbar region, with neurogenic claudication 05/22/2015   Danielle Gray PT, DPT 12:04 PM, 11/09/17 [place], [place], [address] Phone: [phone]   Fax:  [phone]  Name: Danielle Gray MRN: [id number] Date of Birth: [birth year]

## 2017-11-10 ENCOUNTER — Encounter (HOSPITAL_COMMUNITY): Payer: Self-pay

## 2017-11-10 ENCOUNTER — Ambulatory Visit (HOSPITAL_COMMUNITY): Payer: Managed Care, Other (non HMO)

## 2017-11-10 DIAGNOSIS — R2689 Other abnormalities of gait and mobility: Secondary | ICD-10-CM

## 2017-11-10 DIAGNOSIS — M25562 Pain in left knee: Secondary | ICD-10-CM

## 2017-11-10 DIAGNOSIS — M6281 Muscle weakness (generalized): Secondary | ICD-10-CM

## 2017-11-10 DIAGNOSIS — M25662 Stiffness of left knee, not elsewhere classified: Secondary | ICD-10-CM

## 2017-11-10 DIAGNOSIS — R29898 Other symptoms and signs involving the musculoskeletal system: Secondary | ICD-10-CM

## 2017-11-10 NOTE — Therapy (Signed)
[place], [place], [address] Phone: [phone]   Fax:  [phone]  Physical Therapy Treatment  Patient Details  Name: Danielle Gray MRN: [id number] Date of Birth: June 01, 1950 Referring Provider: Rod Can, MD   Encounter Date: 11/10/2017  PT End of Session - 11/10/17 1623    Visit Number  2    Number of Visits  19    Date for PT Re-Evaluation  12/21/17 Minireassess 11/30/17    Authorization Type  [employer] managed (based on medical necessity)    Authorization Time Period  11/09/17 - 12/21/17    Authorization - Visit Number  2    Authorization - Number of Visits  60    PT Start Time  1603    PT Stop Time  1646    PT Time Calculation (min)  43 min    Equipment Utilized During Treatment  Gait belt    Activity Tolerance  Patient tolerated treatment well    Behavior During Therapy  WFL for tasks assessed/performed       Past Medical History:  Diagnosis Date  . Arthritis   . Back pain   . GERD (gastroesophageal reflux disease)   . 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 Hypertension    changed diet, weight loss, no longer prescribed medication  . Lumbar herniated disc   . Spinal stenosis   . Spondylosis     Past Surgical History:  Procedure Laterality Date  . BLADDER TACK  25 YRS AGO  . BREAST BIOPSY    . colonscopy     . JOINT REPLACEMENT  2006   RT TOTAL KNEE  . KNEE ARTHROPLASTY Left 10/28/2017   Procedure: LEFT TOTAL KNEE ARTHROPLASTY WITH COMPUTER NAVIGATION;  Surgeon: Rod Can, MD;  Location: [facility];  Service: Orthopedics;  Laterality: Left;  Needs RNFA  . LUMBAR LAMINECTOMY/DECOMPRESSION MICRODISCECTOMY Left 05/22/2015   Procedure: CENTRAL DECOMPRESSION LUMBAR LAMINECTOMY L3-4 AND L4-5, 2 LEVEL SPINAL STENOSIS WITH MICRODISCECTOMY L3-4 ON LEFT AND FORAMINOTOMY L3-4 AND L4-5 LEFT;  Surgeon: Latanya Maudlin, MD;  Location: [facility];  Service: Orthopedics;  Laterality: Left;  .  TOTAL HIP ARTHROPLASTY Left 03/26/2016   Procedure: LEFT TOTAL HIP ARTHROPLASTY ANTERIOR APPROACH;  Surgeon: Rod Can, MD;  Location: [facility];  Service: Orthopedics;  Laterality: Left;  Nedds RNFA  . TOTAL HIP ARTHROPLASTY Right 12/03/2016   Procedure: RIGHT TOTAL HIP ARTHROPLASTY ANTERIOR APPROACH;  Surgeon: Rod Can, MD;  Location: [facility];  Service: Orthopedics;  Laterality: Right;  Needs RNFA  . TOTAL KNEE ARTHROPLASTY Right    Dr. Theda Sers  . TUBAL LIGATION    . UPPER GI ENDOSCOPY      There were no vitals filed for this visit.  Subjective Assessment - 11/10/17 1608    Subjective  Pt stated she continues to be compliance with HEP.  Reports she has been impressed with Lt knee compared to her Rt knee 13 years ago, going smooth.  Current pain scale 4/10 Lt knee,    Pertinent History  Left TKA 10/28/17, Right THA 12/03/16, Left THA 03/26/16, Central decompression lumbar laminectomy 2017, Right TKA 13 years ago    Patient Stated Goals  Get back to being able to walk without pain    Currently in Pain?  Yes    Pain Score  4     Pain Location  Knee    Pain Orientation  Left    Pain  Descriptors / Indicators  Aching    Pain Type  Surgical pain    Pain Onset  1 to 4 weeks ago    Pain Frequency  Intermittent    Aggravating Factors   walking, standing    Pain Relieving Factors  icing and stretched out    Effect of Pain on Daily Activities  moderately affects                       [facility] Adult PT Treatment/Exercise - 11/10/17 0001      Exercises   Exercises  Knee/Hip      Knee/Hip Exercises: Stretches   Active Hamstring Stretch  3 reps;30 seconds    Active Hamstring Stretch Limitations  supine with rope    Knee: Self-Stretch to increase Flexion  Left;5 reps;10 seconds    Knee: Self-Stretch Limitations  Knee drives on 93ZJ step 5x 10" holds for flexion      Knee/Hip Exercises: Standing   Gait Training  100 ft wtih SPC, good mechanics noted      Knee/Hip Exercises:  Supine   Quad Sets  10 reps    Short Arc Quad Sets  10 reps    Heel Slides  10 reps    Knee Extension  AROM    Knee Extension Limitations  5    Knee Flexion  AROM    Knee Flexion Limitations  93 degrees (was 80)      Manual Therapy   Manual Therapy  Edema management    Manual therapy comments  Manual complete separate than of tx    Edema Management  Retro massage for edema control             PT Education - 11/10/17 1624    Education Details  Reviewed goals and current HEP exercises, copy of eval given to pt.  Reviewed RIcE technqiues to address pain and edema.    Person(s) Educated  Patient    Methods  Explanation;Demonstration;Handout    Comprehension  Verbalized understanding;Returned demonstration       PT Short Term Goals - 11/09/17 1132      PT SHORT TERM GOAL #1   Title  Patient will demonstrate understanding and report regular compliance with HEP in order to improve knee mobility, lower extremity strength, and overall functional mobility.     Time  3    Period  Weeks    Status  New    Target Date  11/30/17      PT SHORT TERM GOAL #2   Title  Patient will demonstrate improvement of 1/2 MMT grade in all deficient muscle groups to assist patient with improved mechanics with gait and stair ambulation.     Time  3    Period  Weeks    Status  New    Target Date  11/30/17      PT SHORT TERM GOAL #3   Title  Patient will demonstrate ability to maintain single limb stance for at least 3 seconds on the left lower extremity in order to assist with improved safety and independence with stair ambulation.     Time  3    Period  Weeks    Status  New    Target Date  11/30/17      PT SHORT TERM GOAL #4   Title  Patient will demonstrate left knee extension/flexion active range of motion of at least 0-95 degrees to assist with more normalized gait pattern and stair ambulation.  Time  3    Period  Weeks    Status  New    Target Date  11/30/17        PT Long Term  Goals - 11/09/17 1137      PT LONG TERM GOAL #1   Title  Patient will demonstrate left knee extension/flexion active range of motion of at least 0-115 degrees to assist with more normalized gait pattern and stair ambulation.    Time  6    Period  Weeks    Status  New    Target Date  12/21/17      PT LONG TERM GOAL #2   Title  Patient will demonstrate ability to ambulate at a gait velocity of at least 1.2 m/s on the 3MWT with LRAD to improve patient's safety with ambulation at home and in the community.     Time  6    Period  Weeks    Status  New    Target Date  12/21/17      PT LONG TERM GOAL #3   Title  Patient will demonstrate ability to ascend and descend 8 stairs with reciprocal gait pattern indicating improved safety and confidence with stair ambulation for patient's stairs at home.     Time  6    Period  Weeks    Status  New    Target Date  12/21/17      PT LONG TERM GOAL #4   Title  Patient will demonstrate at least 4+/5 MMT strength in all tested muscle groups in order to improve patient's ease of completing work activities and help patient return to PLOF.     Time  6    Period  Weeks    Status  New    Target Date  12/21/17            Plan - 11/10/17 1628    Clinical Impression Statement  Reviewed goals, discussed compliance with and reviewed exercises completeing at home and copy of eval given to pt.  Session focus with knee mobility and manual technqiues for edema control.  Pt able to recall and demonstrate all exercises appropriately without cueing required.  Added knee drives to improve flexion to HEP.  Improved AROM 5-93 degrees (was 5-80 last session).  Pt presented with good gait mechanics, did progress to gait with SPC, pt able to demonstrate good heel to toe mechanics with appropriate sequenceing and no LOB episodes.  Discussed RICE technqiues to address pain and edema control.      Rehab Potential  Good    Clinical Impairments Affecting Rehab Potential   Positive: Social support, positive response from therapy previously. Negative: Multiple lower extremity surgeries    PT Frequency  3x / week    PT Duration  6 weeks    PT Treatment/Interventions  ADLs/Self Care Home Management;Aquatic Therapy;Cryotherapy;Moist Heat;DME Instruction;Gait training;Stair training;Functional mobility training;Therapeutic activities;Therapeutic exercise;Balance training;Neuromuscular re-education;Patient/family education;Manual techniques;Manual lymph drainage;Scar mobilization;Passive range of motion;Dry needling;Energy conservation;Taping    PT Next Visit Plan  Next session add rocker board and standing TKE.  Continue with knee mobility exercises and edema reduction.  Progress to functional strengthening and balance as able.      PT Home Exercise Plan  HHPT exercises appropriate for now.  update PRN       Patient will benefit from skilled therapeutic intervention in order to improve the following deficits and impairments:  Abnormal gait, Decreased balance, Decreased endurance, Decreased mobility, Difficulty walking, Hypomobility, Decreased range of  motion, Decreased scar mobility, Increased edema, Improper body mechanics, Decreased activity tolerance, Decreased strength, Increased fascial restricitons, Impaired flexibility, Pain  Visit Diagnosis: Acute pain of left knee  Muscle weakness (generalized)  Other abnormalities of gait and mobility  Other symptoms and signs involving the musculoskeletal system  Stiffness of left knee, not elsewhere classified     Problem List Patient Active Problem List   Diagnosis Date Noted  . Osteoarthritis of left knee 10/28/2017  . Primary osteoarthritis of right hip 12/03/2016  . Osteoarthritis of right hip 12/03/2016  . Spinal stenosis, lumbar region, with neurogenic claudication 05/22/2015   Ihor Austin, [place]; [place]  Aldona Lento 11/10/2017, 5:03 PM  [place], [place], [address] Phone: [phone]   Fax:  [phone]  Name: KENAE LINDQUIST MRN: [id number] Date of Birth: 05-03-1950

## 2017-11-11 ENCOUNTER — Encounter (HOSPITAL_COMMUNITY): Payer: Self-pay

## 2017-11-11 ENCOUNTER — Ambulatory Visit (HOSPITAL_COMMUNITY): Payer: Managed Care, Other (non HMO)

## 2017-11-11 DIAGNOSIS — R29898 Other symptoms and signs involving the musculoskeletal system: Secondary | ICD-10-CM

## 2017-11-11 DIAGNOSIS — R2689 Other abnormalities of gait and mobility: Secondary | ICD-10-CM

## 2017-11-11 DIAGNOSIS — M6281 Muscle weakness (generalized): Secondary | ICD-10-CM

## 2017-11-11 DIAGNOSIS — M25562 Pain in left knee: Secondary | ICD-10-CM | POA: Diagnosis not present

## 2017-11-11 DIAGNOSIS — M25662 Stiffness of left knee, not elsewhere classified: Secondary | ICD-10-CM

## 2017-11-11 NOTE — Therapy (Signed)
[place], [place], [address] Phone: [phone]   Fax:  [phone]  Physical Therapy Treatment  Patient Details  Name: Danielle Gray MRN: [id number] Date of Birth: [birth year] Referring Provider: Rod Can, MD   Encounter Date: 11/11/2017  PT End of Session - 11/11/17 0827    Visit Number  3    Number of Visits  19    Date for PT Re-Evaluation  12/21/17 Minireassess 11/30/17    Authorization Type  [employer] managed (based on medical necessity)    Authorization Time Period  11/09/17 - 12/21/17    Authorization - Visit Number  3    Authorization - Number of Visits  60    PT Start Time  0816    PT Stop Time  0859    PT Time Calculation (min)  43 min    Activity Tolerance  Patient tolerated treatment well    Behavior During Therapy  [facility] for tasks assessed/performed       Past Medical History:  Diagnosis Date  . Arthritis   . Back pain   . GERD (gastroesophageal reflux disease)   . 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 Hypertension    changed diet, weight loss, no longer prescribed medication  . Lumbar herniated disc   . Spinal stenosis   . Spondylosis     Past Surgical History:  Procedure Laterality Date  . BLADDER TACK  25 YRS AGO  . BREAST BIOPSY    . colonscopy     . JOINT REPLACEMENT  2006   RT TOTAL KNEE  . KNEE ARTHROPLASTY Left 10/28/2017   Procedure: LEFT TOTAL KNEE ARTHROPLASTY WITH COMPUTER NAVIGATION;  Surgeon: Rod Can, MD;  Location: [facility];  Service: Orthopedics;  Laterality: Left;  Needs RNFA  . LUMBAR LAMINECTOMY/DECOMPRESSION MICRODISCECTOMY Left 05/22/2015   Procedure: CENTRAL DECOMPRESSION LUMBAR LAMINECTOMY L3-4 AND L4-5, 2 LEVEL SPINAL STENOSIS WITH MICRODISCECTOMY L3-4 ON LEFT AND FORAMINOTOMY L3-4 AND L4-5 LEFT;  Surgeon: Latanya Maudlin, MD;  Location: [facility];  Service: Orthopedics;  Laterality: Left;  . TOTAL HIP ARTHROPLASTY Left 03/26/2016   Procedure:  LEFT TOTAL HIP ARTHROPLASTY ANTERIOR APPROACH;  Surgeon: Rod Can, MD;  Location: [facility];  Service: Orthopedics;  Laterality: Left;  Nedds RNFA  . TOTAL HIP ARTHROPLASTY Right 12/03/2016   Procedure: RIGHT TOTAL HIP ARTHROPLASTY ANTERIOR APPROACH;  Surgeon: Rod Can, MD;  Location: [facility];  Service: Orthopedics;  Laterality: Right;  Needs RNFA  . TOTAL KNEE ARTHROPLASTY Right    Dr. Theda Sers  . TUBAL LIGATION    . UPPER GI ENDOSCOPY      There were no vitals filed for this visit.  Subjective Assessment - 11/11/17 0817    Subjective  Pt stated she took one pain medication last night, reports intermittent cramping last night.  Currrent pain scale 6/10 Lt knee.    Pertinent History  Left TKA 10/28/17, Right THA 12/03/16, Left THA 03/26/16, Central decompression lumbar laminectomy 2017, Right TKA 13 years ago    Patient Stated Goals  Get back to being able to walk without pain    Currently in Pain?  Yes    Pain Score  6     Pain Location  Knee    Pain Orientation  Left    Pain Descriptors / Indicators  Aching;Cramping;Sore    Pain Type  Surgical pain    Pain Onset  1 to 4 weeks ago  Pain Frequency  Intermittent    Aggravating Factors   walking, standing    Pain Relieving Factors  icing and stretched out    Effect of Pain on Daily Activities  moderately affects                       [facility] Adult PT Treatment/Exercise - 11/11/17 0001      Knee/Hip Exercises: Stretches   Active Hamstring Stretch  3 reps;30 seconds  (Pended)     Active Hamstring Stretch Limitations  supine with rope  (Pended)     Knee: Self-Stretch to increase Flexion  Left;10 seconds;Limitations    Knee: Self-Stretch Limitations  Knee drives on 53GU step 44I 10" holds for flexion      Knee/Hip Exercises: Standing   Heel Raises  10 reps;Limitations    Heel Raises Limitations  toe raises 10x    Rocker Board  2 minutes;Limitations    Rocker Board Limitations  lateral; DF/PF      Knee/Hip  Exercises: Supine   Quad Sets  15 reps    Short Arc Quad Sets  15 reps    Heel Slides  10 reps    Knee Extension  AROM    Knee Extension Limitations  5    Knee Flexion  AROM    Knee Flexion Limitations  100 degrees (was 93 last session)      Manual Therapy   Manual Therapy  Edema management  (Pended)     Manual therapy comments  Manual complete separate than of tx  (Pended)     Edema Management  Retro massage for edema control  (Pended)              PT Education - 11/10/17 1624    Education Details  Reviewed goals and current HEP exercises, copy of eval given to pt.  Reviewed RIcE technqiues to address pain and edema.    Person(s) Educated  Patient    Methods  Explanation;Demonstration;Handout    Comprehension  Verbalized understanding;Returned demonstration       PT Short Term Goals - 11/09/17 1132      PT SHORT TERM GOAL #1   Title  Patient will demonstrate understanding and report regular compliance with HEP in order to improve knee mobility, lower extremity strength, and overall functional mobility.     Time  3    Period  Weeks    Status  New    Target Date  11/30/17      PT SHORT TERM GOAL #2   Title  Patient will demonstrate improvement of 1/2 MMT grade in all deficient muscle groups to assist patient with improved mechanics with gait and stair ambulation.     Time  3    Period  Weeks    Status  New    Target Date  11/30/17      PT SHORT TERM GOAL #3   Title  Patient will demonstrate ability to maintain single limb stance for at least 3 seconds on the left lower extremity in order to assist with improved safety and independence with stair ambulation.     Time  3    Period  Weeks    Status  New    Target Date  11/30/17      PT SHORT TERM GOAL #4   Title  Patient will demonstrate left knee extension/flexion active range of motion of at least 0-95 degrees to assist with more normalized gait pattern and stair ambulation.  Time  3    Period  Weeks    Status   New    Target Date  11/30/17        PT Long Term Goals - 11/09/17 1137      PT LONG TERM GOAL #1   Title  Patient will demonstrate left knee extension/flexion active range of motion of at least 0-115 degrees to assist with more normalized gait pattern and stair ambulation.    Time  6    Period  Weeks    Status  New    Target Date  12/21/17      PT LONG TERM GOAL #2   Title  Patient will demonstrate ability to ambulate at a gait velocity of at least 1.2 m/s on the 3MWT with LRAD to improve patient's safety with ambulation at home and in the community.     Time  6    Period  Weeks    Status  New    Target Date  12/21/17      PT LONG TERM GOAL #3   Title  Patient will demonstrate ability to ascend and descend 8 stairs with reciprocal gait pattern indicating improved safety and confidence with stair ambulation for patient's stairs at home.     Time  6    Period  Weeks    Status  New    Target Date  12/21/17      PT LONG TERM GOAL #4   Title  Patient will demonstrate at least 4+/5 MMT strength in all tested muscle groups in order to improve patient's ease of completing work activities and help patient return to PLOF.     Time  6    Period  Weeks    Status  New    Target Date  12/21/17            Plan - 11/11/17 7322    Clinical Impression Statement  Continued session focus with knee mobility and edema control.  Added CKC for gait strengthening exercises including heel/toe raises and rockerboard to improve weight distribution with gait.  During standing exercises pt c/o nausea/lightheadedness, pt given chair to rest and water to drink.  BP taken at 110/72 mmHg and 89 bpm.  Episode resolved quickly and resumed therex in supine for ROM and quad strengthening exercises.  Improved AROM 5-100 (was 5-93 degrees last session.)  EOS with retro massage technqiues to address edema present proximal knee, no reports of pain at EOS.      Rehab Potential  Good    Clinical Impairments  Affecting Rehab Potential  Positive: Social support, positive response from therapy previously. Negative: Multiple lower extremity surgeries    PT Frequency  3x / week    PT Duration  6 weeks    PT Treatment/Interventions  ADLs/Self Care Home Management;Aquatic Therapy;Cryotherapy;Moist Heat;DME Instruction;Gait training;Stair training;Functional mobility training;Therapeutic activities;Therapeutic exercise;Balance training;Neuromuscular re-education;Patient/family education;Manual techniques;Manual lymph drainage;Scar mobilization;Passive range of motion;Dry needling;Energy conservation;Taping    PT Next Visit Plan  Add bike and standing TKE.  Continue with knee mobility exercises and edema reduction.  Progress to functional strengthening and balance as able.      PT Home Exercise Plan  HHPT exercises appropriate for now.  update PRN       Patient will benefit from skilled therapeutic intervention in order to improve the following deficits and impairments:  Abnormal gait, Decreased balance, Decreased endurance, Decreased mobility, Difficulty walking, Hypomobility, Decreased range of motion, Decreased scar mobility, Increased edema, Improper body mechanics,  Decreased activity tolerance, Decreased strength, Increased fascial restricitons, Impaired flexibility, Pain  Visit Diagnosis: Acute pain of left knee  Muscle weakness (generalized)  Other abnormalities of gait and mobility  Other symptoms and signs involving the musculoskeletal system  Stiffness of left knee, not elsewhere classified     Problem List Patient Active Problem List   Diagnosis Date Noted  . Osteoarthritis of left knee 10/28/2017  . Primary osteoarthritis of right hip 12/03/2016  . Osteoarthritis of right hip 12/03/2016  . Spinal stenosis, lumbar region, with neurogenic claudication 05/22/2015   Ihor Austin, [place]; [place]  Aldona Lento 11/11/2017, 9:17 AM  [place], [place], [address] Phone: [phone]   Fax:  [phone]  Name: Danielle Gray MRN: [id number] Date of Birth: 11-03-1950

## 2017-11-12 ENCOUNTER — Encounter

## 2017-11-15 ENCOUNTER — Encounter (HOSPITAL_COMMUNITY): Payer: Self-pay

## 2017-11-15 ENCOUNTER — Ambulatory Visit (HOSPITAL_COMMUNITY): Payer: Managed Care, Other (non HMO)

## 2017-11-15 DIAGNOSIS — M25662 Stiffness of left knee, not elsewhere classified: Secondary | ICD-10-CM

## 2017-11-15 DIAGNOSIS — M6281 Muscle weakness (generalized): Secondary | ICD-10-CM

## 2017-11-15 DIAGNOSIS — M25562 Pain in left knee: Secondary | ICD-10-CM

## 2017-11-15 DIAGNOSIS — R29898 Other symptoms and signs involving the musculoskeletal system: Secondary | ICD-10-CM

## 2017-11-15 DIAGNOSIS — R2689 Other abnormalities of gait and mobility: Secondary | ICD-10-CM

## 2017-11-15 NOTE — Therapy (Signed)
[place], [place], [address] Phone: [phone]   Fax:  [phone]  Physical Therapy Treatment  Patient Details  Name: Danielle Gray MRN: [id number] Date of Birth: Jul 07, 1950 Referring Provider: Rod Can, MD   Encounter Date: 11/15/2017  PT End of Session - 11/15/17 0821    Visit Number  4    Number of Visits  19    Date for PT Re-Evaluation  12/21/17 Minireassess 12/03/17    Authorization Time Period  11/09/17 - 12/21/17    Authorization - Visit Number  4    Authorization - Number of Visits  60    PT Start Time  [phone] 4' on bike, not included in charges    PT Stop Time  0858    PT Time Calculation (min)  42 min    Activity Tolerance  Patient tolerated treatment well    Behavior During Therapy  [facility] for tasks assessed/performed       Past Medical History:  Diagnosis Date  . Arthritis   . Back pain   . GERD (gastroesophageal reflux disease)   . 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 Hypertension    changed diet, weight loss, no longer prescribed medication  . Lumbar herniated disc   . Spinal stenosis   . Spondylosis     Past Surgical History:  Procedure Laterality Date  . BLADDER TACK  25 YRS AGO  . BREAST BIOPSY    . colonscopy     . JOINT REPLACEMENT  2006   RT TOTAL KNEE  . KNEE ARTHROPLASTY Left 10/28/2017   Procedure: LEFT TOTAL KNEE ARTHROPLASTY WITH COMPUTER NAVIGATION;  Surgeon: Rod Can, MD;  Location: [facility];  Service: Orthopedics;  Laterality: Left;  Needs RNFA  . LUMBAR LAMINECTOMY/DECOMPRESSION MICRODISCECTOMY Left 05/22/2015   Procedure: CENTRAL DECOMPRESSION LUMBAR LAMINECTOMY L3-4 AND L4-5, 2 LEVEL SPINAL STENOSIS WITH MICRODISCECTOMY L3-4 ON LEFT AND FORAMINOTOMY L3-4 AND L4-5 LEFT;  Surgeon: Latanya Maudlin, MD;  Location: [facility];  Service: Orthopedics;  Laterality: Left;  . TOTAL HIP ARTHROPLASTY Left 03/26/2016   Procedure: LEFT TOTAL HIP ARTHROPLASTY  ANTERIOR APPROACH;  Surgeon: Rod Can, MD;  Location: [facility];  Service: Orthopedics;  Laterality: Left;  Nedds RNFA  . TOTAL HIP ARTHROPLASTY Right 12/03/2016   Procedure: RIGHT TOTAL HIP ARTHROPLASTY ANTERIOR APPROACH;  Surgeon: Rod Can, MD;  Location: [facility];  Service: Orthopedics;  Laterality: Right;  Needs RNFA  . TOTAL KNEE ARTHROPLASTY Right    Dr. Theda Sers  . TUBAL LIGATION    . UPPER GI ENDOSCOPY      There were no vitals filed for this visit.  Subjective Assessment - 11/15/17 0816    Subjective  Pt arrived with [facility] and big smile on face.  Reports she went to MD last week and doctor is happy with progress.  No reports of pain currently.  Reports change to Tramadol vs hydrocodone.    Pertinent History  Left TKA 10/28/17, Right THA 12/03/16, Left THA 03/26/16, Central decompression lumbar laminectomy 2017, Right TKA 13 years ago    Patient Stated Goals  Get back to being able to walk without pain    Currently in Pain?  No/denies                       [facility] Adult PT Treatment/Exercise - 11/15/17 0001      Knee/Hip Exercises: Stretches   Active  Hamstring Stretch  3 reps;30 seconds    Active Hamstring Stretch Limitations  supine with rope    Quad Stretch  3 reps;30 seconds    Knee: Self-Stretch to increase Flexion  Left;10 seconds;Limitations    Knee: Self-Stretch Limitations  Knee drives on 16XW step 96E 10" holds for flexion    Gastroc Stretch  2 reps;30 seconds 1 against wall; 1 slant board      Knee/Hip Exercises: Aerobic   Stationary Bike  4' on bike for mobility, original rocking then able to make full revolution x4       Knee/Hip Exercises: Standing   Heel Raises  10 reps;Limitations    Heel Raises Limitations  toe raises 10x    Terminal Knee Extension  10 reps;Theraband    Theraband Level (Terminal Knee Extension)  Level 2 (Red)    Rocker Board  2 minutes;Limitations    Rocker Board Limitations  lateral; DF/PF      Knee/Hip Exercises: Supine    Quad Sets  10 reps    Short Arc Quad Sets  15 reps    Heel Slides  10 reps    Knee Extension  AROM    Knee Extension Limitations  2    Knee Flexion  AROM    Knee Flexion Limitations  AAROM 105 with belt      Manual Therapy   Manual Therapy  Edema management    Manual therapy comments  Manual complete separate than of tx    Edema Management  Retro massage for edema control               PT Short Term Goals - 11/09/17 1132      PT SHORT TERM GOAL #1   Title  Patient will demonstrate understanding and report regular compliance with HEP in order to improve knee mobility, lower extremity strength, and overall functional mobility.     Time  3    Period  Weeks    Status  New    Target Date  11/30/17      PT SHORT TERM GOAL #2   Title  Patient will demonstrate improvement of 1/2 MMT grade in all deficient muscle groups to assist patient with improved mechanics with gait and stair ambulation.     Time  3    Period  Weeks    Status  New    Target Date  11/30/17      PT SHORT TERM GOAL #3   Title  Patient will demonstrate ability to maintain single limb stance for at least 3 seconds on the left lower extremity in order to assist with improved safety and independence with stair ambulation.     Time  3    Period  Weeks    Status  New    Target Date  11/30/17      PT SHORT TERM GOAL #4   Title  Patient will demonstrate left knee extension/flexion active range of motion of at least 0-95 degrees to assist with more normalized gait pattern and stair ambulation.    Time  3    Period  Weeks    Status  New    Target Date  11/30/17        PT Long Term Goals - 11/09/17 1137      PT LONG TERM GOAL #1   Title  Patient will demonstrate left knee extension/flexion active range of motion of at least 0-115 degrees to assist with more normalized gait pattern and stair  ambulation.    Time  6    Period  Weeks    Status  New    Target Date  12/21/17      PT LONG TERM GOAL #2    Title  Patient will demonstrate ability to ambulate at a gait velocity of at least 1.2 m/s on the 3MWT with LRAD to improve patient's safety with ambulation at home and in the community.     Time  6    Period  Weeks    Status  New    Target Date  12/21/17      PT LONG TERM GOAL #3   Title  Patient will demonstrate ability to ascend and descend 8 stairs with reciprocal gait pattern indicating improved safety and confidence with stair ambulation for patient's stairs at home.     Time  6    Period  Weeks    Status  New    Target Date  12/21/17      PT LONG TERM GOAL #4   Title  Patient will demonstrate at least 4+/5 MMT strength in all tested muscle groups in order to improve patient's ease of completing work activities and help patient return to PLOF.     Time  6    Period  Weeks    Status  New    Target Date  12/21/17            Plan - 11/15/17 1245    Clinical Impression Statement  Pt arrived with smile on face and ambulating with SPC.  Continued session focus with knee mobilitiy and manual to address edema present medial aspect of knee.  Added bike and quad stretch to address knee flexion.  During standing exercises pt c/o nausea and lightheadedness 3 episode during standing exercises.  Pt reports she took pain medication with breakfast around 7:00 this morning.  BP taken at 136/78 mmHg with HR at 85 bpm.  Pt continues to progress iwht improved  AROM 2-105 degrees (was 5-100 degrees last session) and no reports of pain through session.  EOS with manual retrograde massage to address edema present on medial aspect of knee.      Rehab Potential  Good    Clinical Impairments Affecting Rehab Potential  Positive: Social support, positive response from therapy previously. Negative: Multiple lower extremity surgeries    PT Frequency  3x / week    PT Duration  6 weeks    PT Treatment/Interventions  ADLs/Self Care Home Management;Aquatic Therapy;Cryotherapy;Moist Heat;DME Instruction;Gait  training;Stair training;Functional mobility training;Therapeutic activities;Therapeutic exercise;Balance training;Neuromuscular re-education;Patient/family education;Manual techniques;Manual lymph drainage;Scar mobilization;Passive range of motion;Dry needling;Energy conservation;Taping    PT Next Visit Plan  Next session begin sit to stand and continue with knee mobility exercises and edema control.  Progress to functional strengthening and balance as able.      PT Home Exercise Plan  HHPT exercises appropriate for now.  11/15/17: quad and gastroc stretches       Patient will benefit from skilled therapeutic intervention in order to improve the following deficits and impairments:  Abnormal gait, Decreased balance, Decreased endurance, Decreased mobility, Difficulty walking, Hypomobility, Decreased range of motion, Decreased scar mobility, Increased edema, Improper body mechanics, Decreased activity tolerance, Decreased strength, Increased fascial restricitons, Impaired flexibility, Pain  Visit Diagnosis: Acute pain of left knee  Muscle weakness (generalized)  Other abnormalities of gait and mobility  Other symptoms and signs involving the musculoskeletal system  Stiffness of left knee, not elsewhere classified  Problem List Patient Active Problem List   Diagnosis Date Noted  . Osteoarthritis of left knee 10/28/2017  . Primary osteoarthritis of right hip 12/03/2016  . Osteoarthritis of right hip 12/03/2016  . Spinal stenosis, lumbar region, with neurogenic claudication 05/22/2015   Ihor Austin, [place]; [place]  Aldona Lento 11/15/2017, 12:53 PM  [place], [place], [address] Phone: [phone]   Fax:  [phone]  Name: Danielle Gray MRN: [id number] Date of Birth: October 28, 1950

## 2017-11-15 NOTE — Patient Instructions (Signed)
Knee / Archie Balboa on stomach, knees together. Grab one ankle with same side hand. Use towel if needed to reach. Gently pull foot toward buttock. Hold 30 seconds. Repeat with other leg. Repeat 3 times. Do 2 sessions per day.  Copyright  VHI. All rights reserved.   Gastroc Stretch    Stand with right foot back, leg straight, forward leg bent. Keeping heel on floor, turned slightly out, lean into wall until stretch is felt in calf. Hold 30 seconds. Repeat 3 times per set. Do 1-2 sessions per day.  http://orth.exer.us/27   Copyright  VHI. All rights reserved.   Marland Kitchen

## 2017-11-17 ENCOUNTER — Encounter (HOSPITAL_COMMUNITY): Payer: Self-pay | Admitting: Physical Therapy

## 2017-11-17 ENCOUNTER — Ambulatory Visit (HOSPITAL_COMMUNITY): Payer: Managed Care, Other (non HMO) | Admitting: Physical Therapy

## 2017-11-17 DIAGNOSIS — M25562 Pain in left knee: Secondary | ICD-10-CM

## 2017-11-17 DIAGNOSIS — M6281 Muscle weakness (generalized): Secondary | ICD-10-CM

## 2017-11-17 DIAGNOSIS — M25662 Stiffness of left knee, not elsewhere classified: Secondary | ICD-10-CM

## 2017-11-17 DIAGNOSIS — R29898 Other symptoms and signs involving the musculoskeletal system: Secondary | ICD-10-CM

## 2017-11-17 DIAGNOSIS — R2689 Other abnormalities of gait and mobility: Secondary | ICD-10-CM

## 2017-11-17 NOTE — Therapy (Signed)
[place], [place], [address] Phone: [phone]   Fax:  [phone]  Physical Therapy Treatment  Patient Details  Name: Danielle Gray MRN: [id number] Date of Birth: [birth year] Referring Provider: Rod Can, MD   Encounter Date: 11/17/2017  PT End of Session - 11/17/17 1120    Visit Number  5    Number of Visits  19    Date for PT Re-Evaluation  12/21/17 [facility] 12/03/17    Authorization Time Period  11/09/17 - 12/21/17    Authorization - Visit Number  5    Authorization - Number of Visits  60    PT Start Time  1112    PT Stop Time  1151    PT Time Calculation (min)  39 min    Activity Tolerance  Patient tolerated treatment well    Behavior During Therapy  [facility] for tasks assessed/performed       Past Medical History:  Diagnosis Date  . Arthritis   . Back pain   . GERD (gastroesophageal reflux disease)   . 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 Hypertension    changed diet, weight loss, no longer prescribed medication  . Lumbar herniated disc   . Spinal stenosis   . Spondylosis     Past Surgical History:  Procedure Laterality Date  . BLADDER TACK  25 YRS AGO  . BREAST BIOPSY    . colonscopy     . JOINT REPLACEMENT  2006   RT TOTAL KNEE  . KNEE ARTHROPLASTY Left 10/28/2017   Procedure: LEFT TOTAL KNEE ARTHROPLASTY WITH COMPUTER NAVIGATION;  Surgeon: Rod Can, MD;  Location: [facility];  Service: Orthopedics;  Laterality: Left;  Needs RNFA  . LUMBAR LAMINECTOMY/DECOMPRESSION MICRODISCECTOMY Left 05/22/2015   Procedure: CENTRAL DECOMPRESSION LUMBAR LAMINECTOMY L3-4 AND L4-5, 2 LEVEL SPINAL STENOSIS WITH MICRODISCECTOMY L3-4 ON LEFT AND FORAMINOTOMY L3-4 AND L4-5 LEFT;  Surgeon: Latanya Maudlin, MD;  Location: [facility];  Service: Orthopedics;  Laterality: Left;  . TOTAL HIP ARTHROPLASTY Left 03/26/2016   Procedure: LEFT TOTAL HIP ARTHROPLASTY ANTERIOR APPROACH;  Surgeon: Rod Can, MD;  Location: [facility];  Service: Orthopedics;  Laterality: Left;  Nedds RNFA  . TOTAL HIP ARTHROPLASTY Right 12/03/2016   Procedure: RIGHT TOTAL HIP ARTHROPLASTY ANTERIOR APPROACH;  Surgeon: Rod Can, MD;  Location: [facility];  Service: Orthopedics;  Laterality: Right;  Needs RNFA  . TOTAL KNEE ARTHROPLASTY Right    Dr. Theda Sers  . TUBAL LIGATION    . UPPER GI ENDOSCOPY      There were no vitals filed for this visit.  Subjective Assessment - 11/17/17 1117    Subjective  Patient stated that her doctor was pleased with how she has been doing. Patient stated she is feeling good today.     Pertinent History  Left TKA 10/28/17, Right THA 12/03/16, Left THA 03/26/16, Central decompression lumbar laminectomy 2017, Right TKA 13 years ago    Patient Stated Goals  Get back to being able to walk without pain    Currently in Pain?  No/denies                       [facility] Adult PT Treatment/Exercise - 11/17/17 0001      Knee/Hip Exercises: Stretches   Active Hamstring Stretch  3 reps;30 seconds    Active Hamstring Stretch Limitations  supine with rope    Sports administrator  3 reps;30 seconds    Knee: Self-Stretch to increase Flexion  Left;10 seconds;Limitations    Knee: Self-Stretch Limitations  Knee drives on 02VO step 53G 10" holds for flexion    Gastroc Stretch  2 reps;30 seconds;Limitations    Gastroc Stretch Limitations  With slant board      Knee/Hip Exercises: Standing   Heel Raises  10 reps;Limitations    Heel Raises Limitations  toe raises 10x    Terminal Knee Extension  10 reps;Theraband    Theraband Level (Terminal Knee Extension)  Level 2 (Red)    Rocker Board  2 minutes;Limitations    Rocker Board Limitations  lateral; DF/PF      Knee/Hip Exercises: Seated   Sit to Sand  1 set;10 reps;without UE support;Other (comment) From standard height chair      Knee/Hip Exercises: Supine   Short Arc Quad Sets  15 reps;Limitations    Short Arc Quad Sets Limitations   Basketball under patient's knee    Heel Slides  10 reps    Knee Extension  AROM    Knee Extension Limitations  2    Knee Flexion  AROM    Knee Flexion Limitations  AAROM 103      Manual Therapy   Manual Therapy  Edema management    Manual therapy comments  Manual complete separate than of tx    Edema Management  Patient supine with lower extremities elevated for 10 minutes retrograde massage to decrease edema             PT Education - 11/17/17 1120    Education Details  Discussed purpose and technique of exercises throughout session.     Person(s) Educated  Patient    Methods  Explanation    Comprehension  Verbalized understanding;Returned demonstration       PT Short Term Goals - 11/09/17 1132      PT SHORT TERM GOAL #1   Title  Patient will demonstrate understanding and report regular compliance with HEP in order to improve knee mobility, lower extremity strength, and overall functional mobility.     Time  3    Period  Weeks    Status  New    Target Date  11/30/17      PT SHORT TERM GOAL #2   Title  Patient will demonstrate improvement of 1/2 MMT grade in all deficient muscle groups to assist patient with improved mechanics with gait and stair ambulation.     Time  3    Period  Weeks    Status  New    Target Date  11/30/17      PT SHORT TERM GOAL #3   Title  Patient will demonstrate ability to maintain single limb stance for at least 3 seconds on the left lower extremity in order to assist with improved safety and independence with stair ambulation.     Time  3    Period  Weeks    Status  New    Target Date  11/30/17      PT SHORT TERM GOAL #4   Title  Patient will demonstrate left knee extension/flexion active range of motion of at least 0-95 degrees to assist with more normalized gait pattern and stair ambulation.    Time  3    Period  Weeks    Status  New    Target Date  11/30/17        PT Long Term Goals - 11/09/17 1137  PT LONG TERM GOAL #1    Title  Patient will demonstrate left knee extension/flexion active range of motion of at least 0-115 degrees to assist with more normalized gait pattern and stair ambulation.    Time  6    Period  Weeks    Status  New    Target Date  12/21/17      PT LONG TERM GOAL #2   Title  Patient will demonstrate ability to ambulate at a gait velocity of at least 1.2 m/s on the 3MWT with LRAD to improve patient's safety with ambulation at home and in the community.     Time  6    Period  Weeks    Status  New    Target Date  12/21/17      PT LONG TERM GOAL #3   Title  Patient will demonstrate ability to ascend and descend 8 stairs with reciprocal gait pattern indicating improved safety and confidence with stair ambulation for patient's stairs at home.     Time  6    Period  Weeks    Status  New    Target Date  12/21/17      PT LONG TERM GOAL #4   Title  Patient will demonstrate at least 4+/5 MMT strength in all tested muscle groups in order to improve patient's ease of completing work activities and help patient return to PLOF.     Time  6    Period  Weeks    Status  New    Target Date  12/21/17            Plan - 11/17/17 1201    Clinical Impression Statement  This session continued to progress patient with exercises to improve patient's left knee ROM and strength. This session added sit to stands from standard height chair without upper extremity support. This session patient demonstrated left knee ROM from 2 to 103 degrees. Patient would benefit from continued skilled physical therapy in order to continue progress toward functional goals.     Rehab Potential  Good    Clinical Impairments Affecting Rehab Potential  Positive: Social support, positive response from therapy previously. Negative: Multiple lower extremity surgeries    PT Frequency  3x / week    PT Duration  6 weeks    PT Treatment/Interventions  ADLs/Self Care Home Management;Aquatic Therapy;Cryotherapy;Moist Heat;DME  Instruction;Gait training;Stair training;Functional mobility training;Therapeutic activities;Therapeutic exercise;Balance training;Neuromuscular re-education;Patient/family education;Manual techniques;Manual lymph drainage;Scar mobilization;Passive range of motion;Dry needling;Energy conservation;Taping    PT Next Visit Plan  Continue with knee mobility exercises and edema control.  Progress to functional strengthening and balance as able.      PT Home Exercise Plan  HHPT exercises appropriate for now.  11/15/17: quad and gastroc stretches       Patient will benefit from skilled therapeutic intervention in order to improve the following deficits and impairments:  Abnormal gait, Decreased balance, Decreased endurance, Decreased mobility, Difficulty walking, Hypomobility, Decreased range of motion, Decreased scar mobility, Increased edema, Improper body mechanics, Decreased activity tolerance, Decreased strength, Increased fascial restricitons, Impaired flexibility, Pain  Visit Diagnosis: Acute pain of left knee  Muscle weakness (generalized)  Other abnormalities of gait and mobility  Other symptoms and signs involving the musculoskeletal system  Stiffness of left knee, not elsewhere classified     Problem List Patient Active Problem List   Diagnosis Date Noted  . Osteoarthritis of left knee 10/28/2017  . Primary osteoarthritis of right hip 12/03/2016  . Osteoarthritis of  right hip 12/03/2016  . Spinal stenosis, lumbar region, with neurogenic claudication 05/22/2015   Clarene Critchley PT, DPT 12:03 PM, 11/17/17 [place], [place], [address] Phone: [phone]   Fax:  [phone]  Name: Danielle Gray MRN: [id number] Date of Birth: 1950/07/01

## 2017-11-18 ENCOUNTER — Encounter (HOSPITAL_COMMUNITY): Payer: Self-pay | Admitting: Physical Therapy

## 2017-11-18 ENCOUNTER — Ambulatory Visit (HOSPITAL_COMMUNITY): Payer: Managed Care, Other (non HMO) | Attending: Orthopedic Surgery | Admitting: Physical Therapy

## 2017-11-18 DIAGNOSIS — R29898 Other symptoms and signs involving the musculoskeletal system: Secondary | ICD-10-CM | POA: Diagnosis present

## 2017-11-18 DIAGNOSIS — R2689 Other abnormalities of gait and mobility: Secondary | ICD-10-CM | POA: Insufficient documentation

## 2017-11-18 DIAGNOSIS — M25662 Stiffness of left knee, not elsewhere classified: Secondary | ICD-10-CM

## 2017-11-18 DIAGNOSIS — M6281 Muscle weakness (generalized): Secondary | ICD-10-CM | POA: Insufficient documentation

## 2017-11-18 DIAGNOSIS — M25562 Pain in left knee: Secondary | ICD-10-CM | POA: Diagnosis present

## 2017-11-18 NOTE — Therapy (Signed)
[place], [place], [address] Phone: [phone]   Fax:  [phone]  Physical Therapy Treatment  Patient Details  Name: Danielle Gray MRN: [id number] Date of Birth: 23-May-1950 Referring Provider: Rod Can, MD   Encounter Date: 11/18/2017  PT End of Session - 11/18/17 0821    Visit Number  6    Number of Visits  19    Date for PT Re-Evaluation  12/21/17 [facility]ess 12/03/17    Authorization Time Period  11/09/17 - 12/21/17    Authorization - Visit Number  6    Authorization - Number of Visits  60    PT Start Time  0815    PT Stop Time  1950    PT Time Calculation (min)  40 min    Activity Tolerance  Patient tolerated treatment well    Behavior During Therapy  [facility] for tasks assessed/performed       Past Medical History:  Diagnosis Date  . Arthritis   . Back pain   . GERD (gastroesophageal reflux disease)   . 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 Hypertension    changed diet, weight loss, no longer prescribed medication  . Lumbar herniated disc   . Spinal stenosis   . Spondylosis     Past Surgical History:  Procedure Laterality Date  . BLADDER TACK  25 YRS AGO  . BREAST BIOPSY    . colonscopy     . JOINT REPLACEMENT  2006   RT TOTAL KNEE  . KNEE ARTHROPLASTY Left 10/28/2017   Procedure: LEFT TOTAL KNEE ARTHROPLASTY WITH COMPUTER NAVIGATION;  Surgeon: Rod Can, MD;  Location: [facility];  Service: Orthopedics;  Laterality: Left;  Needs RNFA  . LUMBAR LAMINECTOMY/DECOMPRESSION MICRODISCECTOMY Left 05/22/2015   Procedure: CENTRAL DECOMPRESSION LUMBAR LAMINECTOMY L3-4 AND L4-5, 2 LEVEL SPINAL STENOSIS WITH MICRODISCECTOMY L3-4 ON LEFT AND FORAMINOTOMY L3-4 AND L4-5 LEFT;  Surgeon: Latanya Maudlin, MD;  Location: [facility];  Service: Orthopedics;  Laterality: Left;  . TOTAL HIP ARTHROPLASTY Left 03/26/2016   Procedure: LEFT TOTAL HIP ARTHROPLASTY ANTERIOR APPROACH;  Surgeon: Rod Can, MD;  Location: [facility];  Service: Orthopedics;  Laterality: Left;  Nedds RNFA  . TOTAL HIP ARTHROPLASTY Right 12/03/2016   Procedure: RIGHT TOTAL HIP ARTHROPLASTY ANTERIOR APPROACH;  Surgeon: Rod Can, MD;  Location: [facility];  Service: Orthopedics;  Laterality: Right;  Needs RNFA  . TOTAL KNEE ARTHROPLASTY Right    Dr. Theda Sers  . TUBAL LIGATION    . UPPER GI ENDOSCOPY      There were no vitals filed for this visit.  Subjective Assessment - 11/18/17 0818    Subjective  Patient stated that she is doing well. She reported some soreness, but no pain.     Pertinent History  Left TKA 10/28/17, Right THA 12/03/16, Left THA 03/26/16, Central decompression lumbar laminectomy 2017, Right TKA 13 years ago    Patient Stated Goals  Get back to being able to walk without pain    Currently in Pain?  No/denies                       [facility] Adult PT Treatment/Exercise - 11/18/17 0001      Knee/Hip Exercises: Stretches   Active Hamstring Stretch  3 reps;30 seconds    Active Hamstring Stretch Limitations  supine with rope    Quad Stretch  3 reps;30 seconds  Knee: Self-Stretch to increase Flexion  Left;10 seconds;Limitations    Knee: Self-Stretch Limitations  Knee drives on 29UT step 65Y 10" holds for flexion    Gastroc Stretch  2 reps;30 seconds;Limitations    Gastroc Stretch Limitations  With slant board      Knee/Hip Exercises: Standing   Heel Raises  15 reps;Both;Limitations    Heel Raises Limitations  15 reps toe raises    Terminal Knee Extension  10 reps;Theraband;Limitations    Theraband Level (Terminal Knee Extension)  Level 2 (Red)    Terminal Knee Extension Limitations  10 second holds    Forward Step Up  Left;1 set;10 reps;Hand Hold: 0;Step Height: 4"    Rocker Board  2 minutes;Limitations    Rocker Board Limitations  lateral; DF/PF    SLS  x10 repetitions on left inside parallel bars max of 3 seconds       Knee/Hip Exercises: Seated   Sit to Sand  1 set;10  reps;without UE support;Other (comment) From standard height chair      Knee/Hip Exercises: Supine   Short Arc Quad Sets  15 reps;Limitations    Short Arc Quad Sets Limitations  Basketball under knee 5'' holds    Heel Slides  10 reps;Limitations    Heel Slides Limitations  5 second holds    Knee Extension  AROM    Knee Extension Limitations  2    Knee Flexion  AROM    Knee Flexion Limitations  101      Manual Therapy   Manual Therapy  Edema management    Manual therapy comments  Manual complete separate than of tx    Edema Management  Patient supine with lower extremities elevated for 10 minutes retrograde massage to decrease edema             PT Education - 11/18/17 0819    Education Details  Discussed purpose and technique of exercises throughout session.     Person(s) Educated  Patient    Methods  Explanation    Comprehension  Verbalized understanding;Returned demonstration       PT Short Term Goals - 11/09/17 1132      PT SHORT TERM GOAL #1   Title  Patient will demonstrate understanding and report regular compliance with HEP in order to improve knee mobility, lower extremity strength, and overall functional mobility.     Time  3    Period  Weeks    Status  New    Target Date  11/30/17      PT SHORT TERM GOAL #2   Title  Patient will demonstrate improvement of 1/2 MMT grade in all deficient muscle groups to assist patient with improved mechanics with gait and stair ambulation.     Time  3    Period  Weeks    Status  New    Target Date  11/30/17      PT SHORT TERM GOAL #3   Title  Patient will demonstrate ability to maintain single limb stance for at least 3 seconds on the left lower extremity in order to assist with improved safety and independence with stair ambulation.     Time  3    Period  Weeks    Status  New    Target Date  11/30/17      PT SHORT TERM GOAL #4   Title  Patient will demonstrate left knee extension/flexion active range of motion of at  least 0-95 degrees to assist with more normalized  gait pattern and stair ambulation.    Time  3    Period  Weeks    Status  New    Target Date  11/30/17        PT Long Term Goals - 11/09/17 1137      PT LONG TERM GOAL #1   Title  Patient will demonstrate left knee extension/flexion active range of motion of at least 0-115 degrees to assist with more normalized gait pattern and stair ambulation.    Time  6    Period  Weeks    Status  New    Target Date  12/21/17      PT LONG TERM GOAL #2   Title  Patient will demonstrate ability to ambulate at a gait velocity of at least 1.2 m/s on the 3MWT with LRAD to improve patient's safety with ambulation at home and in the community.     Time  6    Period  Weeks    Status  New    Target Date  12/21/17      PT LONG TERM GOAL #3   Title  Patient will demonstrate ability to ascend and descend 8 stairs with reciprocal gait pattern indicating improved safety and confidence with stair ambulation for patient's stairs at home.     Time  6    Period  Weeks    Status  New    Target Date  12/21/17      PT LONG TERM GOAL #4   Title  Patient will demonstrate at least 4+/5 MMT strength in all tested muscle groups in order to improve patient's ease of completing work activities and help patient return to PLOF.     Time  6    Period  Weeks    Status  New    Target Date  12/21/17            Plan - 11/18/17 0858    Clinical Impression Statement  This session continued with established plan of care. This session added step ups onto 4'' step and also single leg stance practice. Patient tolerated each new exercise well. Patient's left knee AROM ranged from 2 degrees of extension to 101 degrees of flexion this session. Patient reported that she felt her knee was a little stiffer today. Plan to continue with exercises to improve patient's left knee ROM, lower extremity strengthening, and balance.     Rehab Potential  Good    Clinical Impairments  Affecting Rehab Potential  Positive: Social support, positive response from therapy previously. Negative: Multiple lower extremity surgeries    PT Frequency  3x / week    PT Duration  6 weeks    PT Treatment/Interventions  ADLs/Self Care Home Management;Aquatic Therapy;Cryotherapy;Moist Heat;DME Instruction;Gait training;Stair training;Functional mobility training;Therapeutic activities;Therapeutic exercise;Balance training;Neuromuscular re-education;Patient/family education;Manual techniques;Manual lymph drainage;Scar mobilization;Passive range of motion;Dry needling;Energy conservation;Taping    PT Next Visit Plan  Continue with knee mobility exercises and edema control.  Progress to functional strengthening and balance as able. Add minisquats next session.     PT Home Exercise Plan  HHPT exercises appropriate for now.  11/15/17: quad and gastroc stretches       Patient will benefit from skilled therapeutic intervention in order to improve the following deficits and impairments:  Abnormal gait, Decreased balance, Decreased endurance, Decreased mobility, Difficulty walking, Hypomobility, Decreased range of motion, Decreased scar mobility, Increased edema, Improper body mechanics, Decreased activity tolerance, Decreased strength, Increased fascial restricitons, Impaired flexibility, Pain  Visit Diagnosis: Acute  pain of left knee  Muscle weakness (generalized)  Other abnormalities of gait and mobility  Other symptoms and signs involving the musculoskeletal system  Stiffness of left knee, not elsewhere classified     Problem List Patient Active Problem List   Diagnosis Date Noted  . Osteoarthritis of left knee 10/28/2017  . Primary osteoarthritis of right hip 12/03/2016  . Osteoarthritis of right hip 12/03/2016  . Spinal stenosis, lumbar region, with neurogenic claudication 05/22/2015   Clarene Critchley PT, DPT 9:00 AM, 11/18/17 [place], [place], [address] Phone: [phone]   Fax:  [phone]  Name: MONICK RENA MRN: [id number] Date of Birth: [birth year]

## 2017-11-22 ENCOUNTER — Ambulatory Visit (HOSPITAL_COMMUNITY): Payer: Managed Care, Other (non HMO) | Admitting: Physical Therapy

## 2017-11-22 ENCOUNTER — Encounter (HOSPITAL_COMMUNITY): Payer: Self-pay | Admitting: Physical Therapy

## 2017-11-22 DIAGNOSIS — M25562 Pain in left knee: Secondary | ICD-10-CM

## 2017-11-22 DIAGNOSIS — R29898 Other symptoms and signs involving the musculoskeletal system: Secondary | ICD-10-CM

## 2017-11-22 DIAGNOSIS — M6281 Muscle weakness (generalized): Secondary | ICD-10-CM

## 2017-11-22 DIAGNOSIS — M25662 Stiffness of left knee, not elsewhere classified: Secondary | ICD-10-CM

## 2017-11-22 DIAGNOSIS — R2689 Other abnormalities of gait and mobility: Secondary | ICD-10-CM

## 2017-11-22 NOTE — Therapy (Signed)
[place], [place], [address] Phone: [phone]   Fax:  [phone]  Physical Therapy Treatment  Patient Details  Name: Danielle Gray MRN: [id number] Date of Birth: June 04, 1950 Referring Provider: Rod Can, MD   Encounter Date: 11/22/2017  PT End of Session - 11/22/17 0820    Visit Number  7    Number of Visits  19    Date for PT Re-Evaluation  12/21/17 Min[facility]ess 12/03/17    Authorization Time Period  11/09/17 - 12/21/17    Authorization - Visit Number  7    Authorization - Number of Visits  60    PT Start Time  9798    PT Stop Time  0859    PT Time Calculation (min)  42 min    Activity Tolerance  Patient tolerated treatment well    Behavior During Therapy  [facility] for tasks assessed/performed       Past Medical History:  Diagnosis Date  . Arthritis   . Back pain   . GERD (gastroesophageal reflux disease)   . 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 Hypertension    changed diet, weight loss, no longer prescribed medication  . Lumbar herniated disc   . Spinal stenosis   . Spondylosis     Past Surgical History:  Procedure Laterality Date  . BLADDER TACK  25 YRS AGO  . BREAST BIOPSY    . colonscopy     . JOINT REPLACEMENT  2006   RT TOTAL KNEE  . KNEE ARTHROPLASTY Left 10/28/2017   Procedure: LEFT TOTAL KNEE ARTHROPLASTY WITH COMPUTER NAVIGATION;  Surgeon: Rod Can, MD;  Location: [facility];  Service: Orthopedics;  Laterality: Left;  Needs RNFA  . LUMBAR LAMINECTOMY/DECOMPRESSION MICRODISCECTOMY Left 05/22/2015   Procedure: CENTRAL DECOMPRESSION LUMBAR LAMINECTOMY L3-4 AND L4-5, 2 LEVEL SPINAL STENOSIS WITH MICRODISCECTOMY L3-4 ON LEFT AND FORAMINOTOMY L3-4 AND L4-5 LEFT;  Surgeon: Latanya Maudlin, MD;  Location: [facility];  Service: Orthopedics;  Laterality: Left;  . TOTAL HIP ARTHROPLASTY Left 03/26/2016   Procedure: LEFT TOTAL HIP ARTHROPLASTY ANTERIOR APPROACH;  Surgeon: Rod Can, MD;  Location: [facility];  Service: Orthopedics;  Laterality: Left;  Nedds RNFA  . TOTAL HIP ARTHROPLASTY Right 12/03/2016   Procedure: RIGHT TOTAL HIP ARTHROPLASTY ANTERIOR APPROACH;  Surgeon: Rod Can, MD;  Location: [facility];  Service: Orthopedics;  Laterality: Right;  Needs RNFA  . TOTAL KNEE ARTHROPLASTY Right    Dr. Theda Sers  . TUBAL LIGATION    . UPPER GI ENDOSCOPY      There were no vitals filed for this visit.  Subjective Assessment - 11/22/17 0819    Subjective  Patient stated she did a lot of exercises yesterday and felt sore afterwards.     Pertinent History  Left TKA 10/28/17, Right THA 12/03/16, Left THA 03/26/16, Central decompression lumbar laminectomy 2017, Right TKA 13 years ago    Patient Stated Goals  Get back to being able to walk without pain    Currently in Pain?  No/denies                       [facility] Adult PT Treatment/Exercise - 11/22/17 0001      Knee/Hip Exercises: Stretches   Active Hamstring Stretch  3 reps;30 seconds    Active Hamstring Stretch Limitations  supine with rope    Knee: Self-Stretch to increase Flexion  Left;10 seconds;Limitations  Knee: Self-Stretch Limitations  Knee drives on 95GL step 87F 10" holds for flexion    Gastroc Stretch  2 reps;30 seconds;Limitations    Gastroc Stretch Limitations  With slant board      Knee/Hip Exercises: Aerobic   Stationary Bike  4 minutes on stationary bike for improved ROM seat 11 no resistance. Full revolutions      Knee/Hip Exercises: Standing   Heel Raises  15 reps;Both;Limitations    Heel Raises Limitations  15 reps toe raises    Knee Flexion  AROM;Strengthening;Left;1 set;15 reps    Terminal Knee Extension  10 reps;Theraband;Limitations    Theraband Level (Terminal Knee Extension)  Level 2 (Red)    Terminal Knee Extension Limitations  10 second holds    Lateral Step Up  10 reps;Hand Hold: 0;Step Height: 4";Left;1 set    Forward Step Up  Left;1 set;10 reps;Hand Hold: 0;Step  Height: 4"    Step Down  Left;1 set;10 reps;Hand Hold: 2;Step Height: 4"    Rocker Board  2 minutes;Limitations    Rocker Board Limitations  Lateral    SLS  x10 repetitions on left inside parallel bars max of 3 seconds on right LE also for a maximum of 5 seconds       Knee/Hip Exercises: Supine   Heel Slides  Strengthening;Left;1 set;15 reps    Heel Slides Limitations  5 second holds    Knee Extension  AROM    Knee Extension Limitations  2    Knee Flexion  AROM    Knee Flexion Limitations  103      Manual Therapy   Manual Therapy  Edema management    Manual therapy comments  Manual complete separate than of tx    Edema Management  Patient supine with lower extremities elevated for 8 minutes retrograde massage to decrease edema             PT Education - 11/22/17 0819    Education Details  Discussed purpose and technique of exercises throughout session.     Person(s) Educated  Patient    Methods  Explanation    Comprehension  Verbalized understanding;Returned demonstration       PT Short Term Goals - 11/09/17 1132      PT SHORT TERM GOAL #1   Title  Patient will demonstrate understanding and report regular compliance with HEP in order to improve knee mobility, lower extremity strength, and overall functional mobility.     Time  3    Period  Weeks    Status  New    Target Date  11/30/17      PT SHORT TERM GOAL #2   Title  Patient will demonstrate improvement of 1/2 MMT grade in all deficient muscle groups to assist patient with improved mechanics with gait and stair ambulation.     Time  3    Period  Weeks    Status  New    Target Date  11/30/17      PT SHORT TERM GOAL #3   Title  Patient will demonstrate ability to maintain single limb stance for at least 3 seconds on the left lower extremity in order to assist with improved safety and independence with stair ambulation.     Time  3    Period  Weeks    Status  New    Target Date  11/30/17      PT SHORT TERM  GOAL #4   Title  Patient will demonstrate left knee extension/flexion  active range of motion of at least 0-95 degrees to assist with more normalized gait pattern and stair ambulation.    Time  3    Period  Weeks    Status  New    Target Date  11/30/17        PT Long Term Goals - 11/09/17 1137      PT LONG TERM GOAL #1   Title  Patient will demonstrate left knee extension/flexion active range of motion of at least 0-115 degrees to assist with more normalized gait pattern and stair ambulation.    Time  6    Period  Weeks    Status  New    Target Date  12/21/17      PT LONG TERM GOAL #2   Title  Patient will demonstrate ability to ambulate at a gait velocity of at least 1.2 m/s on the 3MWT with LRAD to improve patient's safety with ambulation at home and in the community.     Time  6    Period  Weeks    Status  New    Target Date  12/21/17      PT LONG TERM GOAL #3   Title  Patient will demonstrate ability to ascend and descend 8 stairs with reciprocal gait pattern indicating improved safety and confidence with stair ambulation for patient's stairs at home.     Time  6    Period  Weeks    Status  New    Target Date  12/21/17      PT LONG TERM GOAL #4   Title  Patient will demonstrate at least 4+/5 MMT strength in all tested muscle groups in order to improve patient's ease of completing work activities and help patient return to PLOF.     Time  6    Period  Weeks    Status  New    Target Date  12/21/17            Plan - 11/22/17 0901    Clinical Impression Statement  This session continued to focus on improving patient's left knee AROM and functional strengthening. This session began with patient on stationary bike to improve ROM which was not included in charges. This session added standing left knee flexion, lateral step ups, step downs, and continued with bike for improved ROM. This session patient's left knee AROM ranged from 2 degrees of extension to 103 degrees of  flexion. Plan to continue with improving patient's left knee ROM and functional strengthening.     Rehab Potential  Good    Clinical Impairments Affecting Rehab Potential  Positive: Social support, positive response from therapy previously. Negative: Multiple lower extremity surgeries    PT Frequency  3x / week    PT Duration  6 weeks    PT Treatment/Interventions  ADLs/Self Care Home Management;Aquatic Therapy;Cryotherapy;Moist Heat;DME Instruction;Gait training;Stair training;Functional mobility training;Therapeutic activities;Therapeutic exercise;Balance training;Neuromuscular re-education;Patient/family education;Manual techniques;Manual lymph drainage;Scar mobilization;Passive range of motion;Dry needling;Energy conservation;Taping    PT Next Visit Plan  Continue with knee mobility exercises and edema control.  Progress to functional strengthening and balance as able. Add minisquats next session.     PT Home Exercise Plan  HHPT exercises appropriate for now.  11/15/17: quad and gastroc stretches       Patient will benefit from skilled therapeutic intervention in order to improve the following deficits and impairments:  Abnormal gait, Decreased balance, Decreased endurance, Decreased mobility, Difficulty walking, Hypomobility, Decreased range of motion, Decreased scar mobility,  Increased edema, Improper body mechanics, Decreased activity tolerance, Decreased strength, Increased fascial restricitons, Impaired flexibility, Pain  Visit Diagnosis: Acute pain of left knee  Muscle weakness (generalized)  Other abnormalities of gait and mobility  Other symptoms and signs involving the musculoskeletal system  Stiffness of left knee, not elsewhere classified     Problem List Patient Active Problem List   Diagnosis Date Noted  . Osteoarthritis of left knee 10/28/2017  . Primary osteoarthritis of right hip 12/03/2016  . Osteoarthritis of right hip 12/03/2016  . Spinal stenosis, lumbar  region, with neurogenic claudication 05/22/2015   Clarene Critchley PT, DPT 9:03 AM, 11/22/17 [place], [place], [address] Phone: [phone]   Fax:  [phone]  Name: Danielle Gray MRN: [id number] Date of Birth: [birth year]

## 2017-11-24 ENCOUNTER — Ambulatory Visit (HOSPITAL_COMMUNITY): Payer: Managed Care, Other (non HMO)

## 2017-11-24 ENCOUNTER — Encounter (HOSPITAL_COMMUNITY): Payer: Self-pay

## 2017-11-24 DIAGNOSIS — M25562 Pain in left knee: Secondary | ICD-10-CM | POA: Diagnosis not present

## 2017-11-24 DIAGNOSIS — R29898 Other symptoms and signs involving the musculoskeletal system: Secondary | ICD-10-CM

## 2017-11-24 DIAGNOSIS — M6281 Muscle weakness (generalized): Secondary | ICD-10-CM

## 2017-11-24 DIAGNOSIS — M25662 Stiffness of left knee, not elsewhere classified: Secondary | ICD-10-CM

## 2017-11-24 DIAGNOSIS — R2689 Other abnormalities of gait and mobility: Secondary | ICD-10-CM

## 2017-11-24 NOTE — Therapy (Signed)
[place], [place], [address] Phone: [phone]   Fax:  [phone]  Physical Therapy Treatment  Patient Details  Name: Danielle Gray MRN: [id number] Date of Birth: [birth year] Referring Provider: Rod Can, mD   Encounter Date: 11/24/2017  PT End of Session - 11/24/17 0820    Visit Number  8    Number of Visits  19    Date for PT Re-Evaluation  12/21/17 Minireassess 12/03/17    Authorization Type  [employer] managed (based on medical necessity)    Authorization Time Period  11/09/17 - 12/21/17    Authorization - Visit Number  8    Authorization - Number of Visits  60    PT Start Time  0814    PT Stop Time  0906    PT Time Calculation (min)  52 min    Activity Tolerance  Patient tolerated treatment well    Behavior During Therapy  [facility] for tasks assessed/performed       Past Medical History:  Diagnosis Date  . Arthritis   . Back pain   . GERD (gastroesophageal reflux disease)   . 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 Hypertension    changed diet, weight loss, no longer prescribed medication  . Lumbar herniated disc   . Spinal stenosis   . Spondylosis     Past Surgical History:  Procedure Laterality Date  . BLADDER TACK  25 YRS AGO  . BREAST BIOPSY    . colonscopy     . JOINT REPLACEMENT  2006   RT TOTAL KNEE  . KNEE ARTHROPLASTY Left 10/28/2017   Procedure: LEFT TOTAL KNEE ARTHROPLASTY WITH COMPUTER NAVIGATION;  Surgeon: Rod Can, MD;  Location: [facility];  Service: Orthopedics;  Laterality: Left;  Needs RNFA  . LUMBAR LAMINECTOMY/DECOMPRESSION MICRODISCECTOMY Left 05/22/2015   Procedure: CENTRAL DECOMPRESSION LUMBAR LAMINECTOMY L3-4 AND L4-5, 2 LEVEL SPINAL STENOSIS WITH MICRODISCECTOMY L3-4 ON LEFT AND FORAMINOTOMY L3-4 AND L4-5 LEFT;  Surgeon: Latanya Maudlin, MD;  Location: [facility];  Service: Orthopedics;  Laterality: Left;  . TOTAL HIP ARTHROPLASTY Left 03/26/2016   Procedure:  LEFT TOTAL HIP ARTHROPLASTY ANTERIOR APPROACH;  Surgeon: Rod Can, MD;  Location: [facility];  Service: Orthopedics;  Laterality: Left;  Nedds RNFA  . TOTAL HIP ARTHROPLASTY Right 12/03/2016   Procedure: RIGHT TOTAL HIP ARTHROPLASTY ANTERIOR APPROACH;  Surgeon: Rod Can, MD;  Location: [facility];  Service: Orthopedics;  Laterality: Right;  Needs RNFA  . TOTAL KNEE ARTHROPLASTY Right    Dr. Theda Sers  . TUBAL LIGATION    . UPPER GI ENDOSCOPY      There were no vitals filed for this visit.  Subjective Assessment - 11/24/17 0817    Subjective  Pt stated she felt good today, has began to ambulate without cane indoors.     Pertinent History  Left TKA 10/28/17, Right THA 12/03/16, Left THA 03/26/16, Central decompression lumbar laminectomy 2017, Right TKA 13 years ago    Patient Stated Goals  Get back to being able to walk without pain    Currently in Pain?  No/denies         [facility] PT Assessment - 11/24/17 0001      Assessment   Medical Diagnosis  Presence of Left artificial knee joint    Referring Provider  Rod Can, mD    Onset Date/Surgical Date  10/28/17    Next MD Visit  12/13/17  Prior Therapy  Yes, for back, left hip       Restrictions   Weight Bearing Restrictions  Yes    LLE Weight Bearing  Weight bearing as tolerated                   [facility] Adult PT Treatment/Exercise - 11/24/17 0001      Knee/Hip Exercises: Stretches   Active Hamstring Stretch  3 reps;30 seconds    Active Hamstring Stretch Limitations  supine with rope    Quad Stretch  3 reps;30 seconds    Knee: Self-Stretch to increase Flexion  Left;10 seconds;Limitations    Knee: Self-Stretch Limitations  Knee drives on 38HW step 29H 10" holds for flexion      Knee/Hip Exercises: Aerobic   Stationary Bike  4 minutes on stationary bike for improved ROM seat 11 no resistance. Full revolutions      Knee/Hip Exercises: Standing   Heel Raises  20 reps;Limitations    Heel Raises Limitations  Toe  raises on incline    Terminal Knee Extension  15 reps    Theraband Level (Terminal Knee Extension)  Level 3 (Green)    Terminal Knee Extension Limitations  10 second holds    Lateral Step Up  15 reps;Hand Hold: 0;Step Height: 4"    Forward Step Up  Left;15 reps;Hand Hold: 0;Step Height: 6"    Functional Squat  10 reps    SLS  Lt 10", Rt    Other Standing Knee Exercises  Tandem stance 2x 30"; 2nd set palvo with RTB on foam      Manual Therapy   Manual Therapy  Edema management    Manual therapy comments  Manual complete separate than of tx    Edema Management  Retro massage with LE elevated                PT Short Term Goals - 11/09/17 1132      PT SHORT TERM GOAL #1   Title  Patient will demonstrate understanding and report regular compliance with HEP in order to improve knee mobility, lower extremity strength, and overall functional mobility.     Time  3    Period  Weeks    Status  New    Target Date  11/30/17      PT SHORT TERM GOAL #2   Title  Patient will demonstrate improvement of 1/2 MMT grade in all deficient muscle groups to assist patient with improved mechanics with gait and stair ambulation.     Time  3    Period  Weeks    Status  New    Target Date  11/30/17      PT SHORT TERM GOAL #3   Title  Patient will demonstrate ability to maintain single limb stance for at least 3 seconds on the left lower extremity in order to assist with improved safety and independence with stair ambulation.     Time  3    Period  Weeks    Status  New    Target Date  11/30/17      PT SHORT TERM GOAL #4   Title  Patient will demonstrate left knee extension/flexion active range of motion of at least 0-95 degrees to assist with more normalized gait pattern and stair ambulation.    Time  3    Period  Weeks    Status  New    Target Date  11/30/17        PT  Long Term Goals - 11/09/17 1137      PT LONG TERM GOAL #1   Title  Patient will demonstrate left knee  extension/flexion active range of motion of at least 0-115 degrees to assist with more normalized gait pattern and stair ambulation.    Time  6    Period  Weeks    Status  New    Target Date  12/21/17      PT LONG TERM GOAL #2   Title  Patient will demonstrate ability to ambulate at a gait velocity of at least 1.2 m/s on the 3MWT with LRAD to improve patient's safety with ambulation at home and in the community.     Time  6    Period  Weeks    Status  New    Target Date  12/21/17      PT LONG TERM GOAL #3   Title  Patient will demonstrate ability to ascend and descend 8 stairs with reciprocal gait pattern indicating improved safety and confidence with stair ambulation for patient's stairs at home.     Time  6    Period  Weeks    Status  New    Target Date  12/21/17      PT LONG TERM GOAL #4   Title  Patient will demonstrate at least 4+/5 MMT strength in all tested muscle groups in order to improve patient's ease of completing work activities and help patient return to PLOF.     Time  6    Period  Weeks    Status  New    Target Date  12/21/17            Plan - 11/24/17 0857    Clinical Impression Statement  Pt presents with vast improvements this session wiht improved knee mobility AROM 2-112 degrees (was 2-103 degrees last sessions, improved balance iwht ability to SLS Lt 10" and Rt 16" max as well as progressing well towards functional strengthening.  Continued session focus wiht mobility and strengthening.  Added functional squats with good mechanics using chair tapping, able to increase height with step up/step down and began balance activities on dynamic surface.  No reports of pain through session.  EOS with manual to address edema present proximal knee and soft tissue restrictions.      Rehab Potential  Good    Clinical Impairments Affecting Rehab Potential  Positive: Social support, positive response from therapy previously. Negative: Multiple lower extremity surgeries     PT Frequency  3x / week    PT Duration  6 weeks    PT Treatment/Interventions  ADLs/Self Care Home Management;Aquatic Therapy;Cryotherapy;Moist Heat;DME Instruction;Gait training;Stair training;Functional mobility training;Therapeutic activities;Therapeutic exercise;Balance training;Neuromuscular re-education;Patient/family education;Manual techniques;Manual lymph drainage;Scar mobilization;Passive range of motion;Dry needling;Energy conservation;Taping    PT Next Visit Plan  Continue with knee mobility exercises and edema control.  Progress to functional strengthening and balance as able. Add sidestepping next session.      PT Home Exercise Plan  HHPT exercises appropriate for now.  11/15/17: quad and gastroc stretches       Patient will benefit from skilled therapeutic intervention in order to improve the following deficits and impairments:  Abnormal gait, Decreased balance, Decreased endurance, Decreased mobility, Difficulty walking, Hypomobility, Decreased range of motion, Decreased scar mobility, Increased edema, Improper body mechanics, Decreased activity tolerance, Decreased strength, Increased fascial restricitons, Impaired flexibility, Pain  Visit Diagnosis: Acute pain of left knee  Muscle weakness (generalized)  Other abnormalities of gait and mobility  Other symptoms and signs involving the musculoskeletal system  Stiffness of left knee, not elsewhere classified     Problem List Patient Active Problem List   Diagnosis Date Noted  . Osteoarthritis of left knee 10/28/2017  . Primary osteoarthritis of right hip 12/03/2016  . Osteoarthritis of right hip 12/03/2016  . Spinal stenosis, lumbar region, with neurogenic claudication 05/22/2015   Ihor Austin, [place]; Brule Aldona Lento 11/24/2017, 2:36 PM  [place], [place], [address] Phone: [phone]   Fax:  [phone]  Name: DELISSA SILBA MRN: [id number] Date of Birth: 05-Aug-1950

## 2017-11-26 ENCOUNTER — Encounter (HOSPITAL_COMMUNITY): Payer: Self-pay

## 2017-11-26 ENCOUNTER — Ambulatory Visit (HOSPITAL_COMMUNITY): Payer: Managed Care, Other (non HMO)

## 2017-11-26 DIAGNOSIS — R29898 Other symptoms and signs involving the musculoskeletal system: Secondary | ICD-10-CM

## 2017-11-26 DIAGNOSIS — M6281 Muscle weakness (generalized): Secondary | ICD-10-CM

## 2017-11-26 DIAGNOSIS — M25562 Pain in left knee: Secondary | ICD-10-CM

## 2017-11-26 DIAGNOSIS — M25662 Stiffness of left knee, not elsewhere classified: Secondary | ICD-10-CM

## 2017-11-26 DIAGNOSIS — R2689 Other abnormalities of gait and mobility: Secondary | ICD-10-CM

## 2017-11-26 NOTE — Therapy (Signed)
[place], [place], [address] Phone: [phone]   Fax:  [phone]  Physical Therapy Treatment  Patient Details  Name: Danielle Gray MRN: [id number] Date of Birth: 1950-12-27 Referring Provider: Rod Can, mD   Encounter Date: 11/26/2017  PT End of Session - 11/26/17 0820    Visit Number  9    Number of Visits  19    Date for PT Re-Evaluation  12/21/17   Minireassess 12/03/17   Authorization Type  [employer] managed (based on medical necessity)    Authorization Time Period  11/09/17 - 12/21/17    Authorization - Visit Number  9    Authorization - Number of Visits  60    PT Start Time  [phone]   4' on bike, not included with charges   PT Stop Time  0900    PT Time Calculation (min)  44 min    Activity Tolerance  Patient tolerated treatment well    Behavior During Therapy  [facility] for tasks assessed/performed       Past Medical History:  Diagnosis Date  . Arthritis   . Back pain   . GERD (gastroesophageal reflux disease)   . 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 Hypertension    changed diet, weight loss, no longer prescribed medication  . Lumbar herniated disc   . Spinal stenosis   . Spondylosis     Past Surgical History:  Procedure Laterality Date  . BLADDER TACK  25 YRS AGO  . BREAST BIOPSY    . colonscopy     . JOINT REPLACEMENT  2006   RT TOTAL KNEE  . KNEE ARTHROPLASTY Left 10/28/2017   Procedure: LEFT TOTAL KNEE ARTHROPLASTY WITH COMPUTER NAVIGATION;  Surgeon: Rod Can, MD;  Location: [facility];  Service: Orthopedics;  Laterality: Left;  Needs RNFA  . LUMBAR LAMINECTOMY/DECOMPRESSION MICRODISCECTOMY Left 05/22/2015   Procedure: CENTRAL DECOMPRESSION LUMBAR LAMINECTOMY L3-4 AND L4-5, 2 LEVEL SPINAL STENOSIS WITH MICRODISCECTOMY L3-4 ON LEFT AND FORAMINOTOMY L3-4 AND L4-5 LEFT;  Surgeon: Latanya Maudlin, MD;  Location: [facility];  Service: Orthopedics;  Laterality: Left;  . TOTAL HIP  ARTHROPLASTY Left 03/26/2016   Procedure: LEFT TOTAL HIP ARTHROPLASTY ANTERIOR APPROACH;  Surgeon: Rod Can, MD;  Location: [facility];  Service: Orthopedics;  Laterality: Left;  Nedds RNFA  . TOTAL HIP ARTHROPLASTY Right 12/03/2016   Procedure: RIGHT TOTAL HIP ARTHROPLASTY ANTERIOR APPROACH;  Surgeon: Rod Can, MD;  Location: [facility];  Service: Orthopedics;  Laterality: Right;  Needs RNFA  . TOTAL KNEE ARTHROPLASTY Right    Dr. Theda Sers  . TUBAL LIGATION    . UPPER GI ENDOSCOPY      There were no vitals filed for this visit.  Subjective Assessment - 11/26/17 0819    Subjective  Pt stated she is feeling good today, no reports of pain just stiffness    Pertinent History  Left TKA 10/28/17, Right THA 12/03/16, Left THA 03/26/16, Central decompression lumbar laminectomy 2017, Right TKA 13 years ago    Patient Stated Goals  Get back to being able to walk without pain    Currently in Pain?  No/denies                       [facility] Adult PT Treatment/Exercise - 11/26/17 0001      Knee/Hip Exercises: Stretches   Quad Stretch  3 reps;30 seconds    Knee:  Self-Stretch to increase Flexion  Left;10 seconds;Limitations    Knee: Self-Stretch Limitations  Knee drives on 98PJ step 82N 10" holds for flexion      Knee/Hip Exercises: Aerobic   Stationary Bike  4 minutes on stationary bike for improved ROM seat 11 no resistance. Full revolutions      Knee/Hip Exercises: Standing   Heel Raises  20 reps;Limitations    Heel Raises Limitations  Toe raises on incline    Terminal Knee Extension  15 reps    Theraband Level (Terminal Knee Extension)  Level 3 (Green)    Terminal Knee Extension Limitations  10 second holds    Lateral Step Up  15 reps;Hand Hold: 0;Step Height: 4"    Forward Step Up  Left;15 reps;Hand Hold: 0;Step Height: 6"    Step Down  Left;15 reps;Hand Hold: 1;Step Height: 4"    Functional Squat  15 reps    SLS  Lt 7", Rt 19" max    Other Standing Knee Exercises  Tandem  stance on foam wiht palov RTB;  sidestep 2RT RTB      Knee/Hip Exercises: Supine   Knee Extension  AROM    Knee Extension Limitations  2    Knee Flexion  AROM    Knee Flexion Limitations  112      Manual Therapy   Manual Therapy  Edema management    Manual therapy comments  Manual complete separate than of tx    Edema Management  Retro massage with LE elevated                PT Short Term Goals - 11/09/17 1132      PT SHORT TERM GOAL #1   Title  Patient will demonstrate understanding and report regular compliance with HEP in order to improve knee mobility, lower extremity strength, and overall functional mobility.     Time  3    Period  Weeks    Status  New    Target Date  11/30/17      PT SHORT TERM GOAL #2   Title  Patient will demonstrate improvement of 1/2 MMT grade in all deficient muscle groups to assist patient with improved mechanics with gait and stair ambulation.     Time  3    Period  Weeks    Status  New    Target Date  11/30/17      PT SHORT TERM GOAL #3   Title  Patient will demonstrate ability to maintain single limb stance for at least 3 seconds on the left lower extremity in order to assist with improved safety and independence with stair ambulation.     Time  3    Period  Weeks    Status  New    Target Date  11/30/17      PT SHORT TERM GOAL #4   Title  Patient will demonstrate left knee extension/flexion active range of motion of at least 0-95 degrees to assist with more normalized gait pattern and stair ambulation.    Time  3    Period  Weeks    Status  New    Target Date  11/30/17        PT Long Term Goals - 11/09/17 1137      PT LONG TERM GOAL #1   Title  Patient will demonstrate left knee extension/flexion active range of motion of at least 0-115 degrees to assist with more normalized gait pattern and stair ambulation.  Time  6    Period  Weeks    Status  New    Target Date  12/21/17      PT LONG TERM GOAL #2   Title  Patient  will demonstrate ability to ambulate at a gait velocity of at least 1.2 m/s on the 3MWT with LRAD to improve patient's safety with ambulation at home and in the community.     Time  6    Period  Weeks    Status  New    Target Date  12/21/17      PT LONG TERM GOAL #3   Title  Patient will demonstrate ability to ascend and descend 8 stairs with reciprocal gait pattern indicating improved safety and confidence with stair ambulation for patient's stairs at home.     Time  6    Period  Weeks    Status  New    Target Date  12/21/17      PT LONG TERM GOAL #4   Title  Patient will demonstrate at least 4+/5 MMT strength in all tested muscle groups in order to improve patient's ease of completing work activities and help patient return to PLOF.     Time  6    Period  Weeks    Status  New    Target Date  12/21/17            Plan - 11/26/17 0909    Clinical Impression Statement  Continued session focus with knee mobility and functional strengthening.  Pt progressing well with functional strengthening activities with ability to reduce HHA and increase reps as able.  Pt continues to have difficulty with balance activities.  Added sidestep for glut med strengthening to assist with gait mechanics and balance.  EOS with manual technqiues to address edema control.  No reports of increased pain through session,      Rehab Potential  Good    Clinical Impairments Affecting Rehab Potential  Positive: Social support, positive response from therapy previously. Negative: Multiple lower extremity surgeries    PT Frequency  3x / week    PT Duration  6 weeks    PT Treatment/Interventions  ADLs/Self Care Home Management;Aquatic Therapy;Cryotherapy;Moist Heat;DME Instruction;Gait training;Stair training;Functional mobility training;Therapeutic activities;Therapeutic exercise;Balance training;Neuromuscular re-education;Patient/family education;Manual techniques;Manual lymph drainage;Scar mobilization;Passive range  of motion;Dry needling;Energy conservation;Taping    PT Next Visit Plan  Continue with knee mobility exercises and edema control.  Progress to functional strengthening and balance as able.  Begin vector stance next session.    PT Home Exercise Plan  HHPT exercises appropriate for now.  11/15/17: quad and gastroc stretches       Patient will benefit from skilled therapeutic intervention in order to improve the following deficits and impairments:  Abnormal gait, Decreased balance, Decreased endurance, Decreased mobility, Difficulty walking, Hypomobility, Decreased range of motion, Decreased scar mobility, Increased edema, Improper body mechanics, Decreased activity tolerance, Decreased strength, Increased fascial restricitons, Impaired flexibility, Pain  Visit Diagnosis: Acute pain of left knee  Muscle weakness (generalized)  Other abnormalities of gait and mobility  Other symptoms and signs involving the musculoskeletal system  Stiffness of left knee, not elsewhere classified     Problem List Patient Active Problem List   Diagnosis Date Noted  . Osteoarthritis of left knee 10/28/2017  . Primary osteoarthritis of right hip 12/03/2016  . Osteoarthritis of right hip 12/03/2016  . Spinal stenosis, lumbar region, with neurogenic claudication 05/22/2015   Ihor Austin, [place]; [place]  Ihor Austin  Denice Paradise 11/26/2017, 9:22 AM  [place], [place], [address] Phone: [phone]   Fax:  [phone]  Name: Danielle Gray MRN: [id number] Date of Birth: 07-May-1950

## 2017-11-29 ENCOUNTER — Ambulatory Visit (HOSPITAL_COMMUNITY): Payer: Managed Care, Other (non HMO) | Admitting: Physical Therapy

## 2017-11-29 DIAGNOSIS — R29898 Other symptoms and signs involving the musculoskeletal system: Secondary | ICD-10-CM

## 2017-11-29 DIAGNOSIS — M6281 Muscle weakness (generalized): Secondary | ICD-10-CM

## 2017-11-29 DIAGNOSIS — R2689 Other abnormalities of gait and mobility: Secondary | ICD-10-CM

## 2017-11-29 DIAGNOSIS — M25562 Pain in left knee: Secondary | ICD-10-CM | POA: Diagnosis not present

## 2017-11-29 NOTE — Therapy (Signed)
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 Clarene Critchley PT, DPT 7:47 AM, 11/30/17 [phone]    Progress Note Reporting Period 11/09/17  to 11/29/17  See note below for Objective Data and Assessment of Progress/Goals.      Encounter Date: 11/29/2017  PT End of Session - 11/29/17 1139    Visit Number  10    Number of Visits  19    Date for PT Re-Evaluation  12/21/17   Minireassess 12/03/17   Authorization Type  [employer] managed (based on medical necessity)    Authorization Time Period  11/09/17 - 12/21/17    Authorization - Visit Number  10    Authorization - Number of Visits  60    PT Start Time  0820    PT Stop Time  0908    PT Time Calculation (min)  48 min    Activity Tolerance  Patient tolerated treatment well    Behavior During Therapy  WFL for tasks assessed/performed       Past Medical History:  Diagnosis Date  . Arthritis   . Back pain   . GERD (gastroesophageal reflux disease)   . 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 Hypertension    changed diet, weight loss, no longer prescribed medication  . Lumbar herniated disc   . Spinal stenosis   . Spondylosis     Past Surgical History:  Procedure Laterality Date  . BLADDER TACK  25 YRS AGO  . BREAST BIOPSY    . colonscopy     . JOINT REPLACEMENT  2006   RT TOTAL KNEE  . KNEE ARTHROPLASTY Left 10/28/2017   Procedure: LEFT TOTAL KNEE ARTHROPLASTY WITH COMPUTER NAVIGATION;  Surgeon: Rod Can, MD;  Location: [facility];  Service: Orthopedics;  Laterality: Left;  Needs RNFA  . LUMBAR LAMINECTOMY/DECOMPRESSION MICRODISCECTOMY Left 05/22/2015   Procedure: CENTRAL DECOMPRESSION LUMBAR LAMINECTOMY L3-4 AND L4-5, 2 LEVEL SPINAL STENOSIS WITH MICRODISCECTOMY L3-4 ON LEFT AND FORAMINOTOMY L3-4 AND L4-5 LEFT;  Surgeon: Latanya Maudlin, MD;  Location: [facility];  Service: Orthopedics;  Laterality: Left;  . TOTAL HIP ARTHROPLASTY Left 03/26/2016   Procedure: LEFT TOTAL HIP ARTHROPLASTY ANTERIOR APPROACH;  Surgeon: Rod Can, MD;  Location: [facility];  Service: Orthopedics;  Laterality: Left;  Nedds RNFA  . TOTAL HIP ARTHROPLASTY Right 12/03/2016   Procedure: RIGHT TOTAL HIP ARTHROPLASTY ANTERIOR APPROACH;  Surgeon: Rod Can, MD;  Location: [facility];  Service: Orthopedics;  Laterality: Right;  Needs RNFA  . TOTAL KNEE ARTHROPLASTY Right    Dr. Theda Sers  . TUBAL LIGATION    . UPPER GI ENDOSCOPY      There were no vitals filed for this visit.  Subjective Assessment - 11/29/17 0819    Subjective  Pt reports no issues, no pain.    Currently in Pain?  No/denies         [facility] PT Assessment - 11/29/17 0001      Assessment   Medical Diagnosis  Presence of Left artificial knee joint    Referring Provider  Rod Can, MD      Observation/Other Assessments   Focus on Therapeutic Outcomes (FOTO)  62% (38% limited)  was 46% (54% limited)      Observation/Other Assessments-Edema    Edema  Circumferential      Circumferential Edema   Circumferential - Right  46.5 cm    Circumferential - Left   48 cm   was 51.5 cm     AROM   Left Knee Extension  2   was lacking 5 degrees   Left Knee Flexion  110   was 80 degrees     Strength   Right/Left Hip  Right;Left    Right Hip Flexion  5/5   was 4+/5   Right Hip Extension  5/5   was 4/5   Left Hip Flexion  4+/5   was 4/5   Left Hip Extension  4+/5   was 3+/5   Right Knee Flexion  5/5   was 5/5   Right Knee Extension  5/5   was 5/5   Left Knee Flexion  5/5   was 4+/5   Left Knee Extension  4+/5   was 4+/5   Right Ankle Dorsiflexion  5/5   was 5/5   Left Ankle Dorsiflexion   5/5   was 4+/5     Ambulation/Gait   Ambulation/Gait  Yes    Ambulation Distance (Feet)  800 Feet   was 452 feet with RW   Assistive device  None    Gait Pattern  Within Functional Limits    Stairs  Yes    Stairs Assistance  6: Modified independent (Device/Increase time)    Stair Management Technique  One rail Right;Other (comment);Alternating pattern    Number of Stairs  8    Height of Stairs  7      Static Standing Balance   Static Standing - Balance Support  No upper extremity supported    Static Standing Balance -  Activities   Single Leg Stance - Right Leg;Single Leg Stance - Left Leg    Static Standing - Comment/# of Minutes  max of 3 Lt: 14", first trial Rt: 30"   was Lt: 1", Rt: 6"                  [facility] Adult PT Treatment/Exercise - 11/29/17 0001      Ambulation/Gait   Gait velocity  1.35 m/sec      Knee/Hip Exercises: Stretches   Quad Stretch  3 reps;30 seconds    Knee: Self-Stretch to increase Flexion  Left;10 seconds;Limitations    Knee: Self-Stretch Limitations  Knee drives on 29UT step 65Y 10" holds for flexion      Knee/Hip Exercises: Aerobic   Stationary Bike  4 minutes on stationary bike for improved ROM seat 11 no resistance. Full revolutions      Knee/Hip Exercises: Standing   Lateral Step Up  15 reps;Hand Hold: 0;Step Height: 4"    Forward Step Up  Left;15 reps;Hand Hold: 0;Step Height: 6"    Step Down  Left;15 reps;Hand Hold: 1;Step Height: 4"    Functional Squat  15 reps      Knee/Hip Exercises: Supine   Knee Extension  AROM    Knee Extension Limitations  2    Knee Flexion  AROM    Knee Flexion Limitations  110      Manual Therapy   Manual Therapy  Edema management    Manual therapy comments  Manual complete separate than of tx    Edema Management  Retro massage with LE elevated  PT Short Term Goals - 11/29/17 7124      PT SHORT TERM GOAL #1   Title  Patient will demonstrate understanding and report  regular compliance with HEP in order to improve knee mobility, lower extremity strength, and overall functional mobility.     Time  3    Period  Weeks    Status  Achieved      PT SHORT TERM GOAL #2   Title  Patient will demonstrate improvement of 1/2 MMT grade in all deficient muscle groups to assist patient with improved mechanics with gait and stair ambulation.     Time  3    Period  Weeks    Status  Achieved      PT SHORT TERM GOAL #3   Title  Patient will demonstrate ability to maintain single limb stance for at least 3 seconds on the left lower extremity in order to assist with improved safety and independence with stair ambulation.     Time  3    Period  Weeks    Status  Achieved      PT SHORT TERM GOAL #4   Title  Patient will demonstrate left knee extension/flexion active range of motion of at least 0-95 degrees to assist with more normalized gait pattern and stair ambulation.    Baseline  2-110    Time  3    Period  Weeks    Status  Partially Met        PT Long Term Goals - 11/29/17 0908      PT LONG TERM GOAL #1   Title  Patient will demonstrate left knee extension/flexion active range of motion of at least 0-115 degrees to assist with more normalized gait pattern and stair ambulation.    Time  6    Period  Weeks    Status  On-going      PT LONG TERM GOAL #2   Title  Patient will demonstrate ability to ambulate at a gait velocity of at least 1.2 m/s on the 3MWT with LRAD to improve patient's safety with ambulation at home and in the community.     Baseline  ambulating 1.35 m/s without AD    Time  6    Period  Weeks    Status  Achieved      PT LONG TERM GOAL #3   Title  Patient will demonstrate ability to ascend and descend 8 stairs with reciprocal gait pattern indicating improved safety and confidence with stair ambulation for patient's stairs at home.     Time  6    Period  Weeks    Status  On-going      PT LONG TERM GOAL #4   Title  Patient will  demonstrate at least 4+/5 MMT strength in all tested muscle groups in order to improve patient's ease of completing work activities and help patient return to PLOF.     Time  6    Period  Weeks    Status  On-going            Plan - 11/29/17 1139    Clinical Impression Statement  10th visit progress checked today with overall improvement in all areas.  Pt has met 3/4 short term goals and 1/4 long term goals.  ROM has improved with 2-110 (was 5-80) as well as strength and functional mobility.  PT continues to have most difficulty with stairs, eccentric>concentric, ambulation tolerance and ROM.  Pt will continue to benefit from  therapy to obtain remaining goals.    Rehab Potential  Good    Clinical Impairments Affecting Rehab Potential  Positive: Social support, positive response from therapy previously. Negative: Multiple lower extremity surgeries    PT Frequency  3x / week    PT Duration  6 weeks    PT Treatment/Interventions  ADLs/Self Care Home Management;Aquatic Therapy;Cryotherapy;Moist Heat;DME Instruction;Gait training;Stair training;Functional mobility training;Therapeutic activities;Therapeutic exercise;Balance training;Neuromuscular re-education;Patient/family education;Manual techniques;Manual lymph drainage;Scar mobilization;Passive range of motion;Dry needling;Energy conservation;Taping    PT Next Visit Plan  Continue with knee mobility exercises and edema control.  Progress to functional strengthening and balance as able.  Begin vector stance next session.    PT Home Exercise Plan  HHPT exercises appropriate for now.  11/15/17: quad and gastroc stretches       Patient will benefit from skilled therapeutic intervention in order to improve the following deficits and impairments:  Abnormal gait, Decreased balance, Decreased endurance, Decreased mobility, Difficulty walking, Hypomobility, Decreased range of motion, Decreased scar mobility, Increased edema, Improper body mechanics,  Decreased activity tolerance, Decreased strength, Increased fascial restricitons, Impaired flexibility, Pain  Visit Diagnosis: Acute pain of left knee  Muscle weakness (generalized)  Other abnormalities of gait and mobility  Other symptoms and signs involving the musculoskeletal system     Problem List Patient Active Problem List   Diagnosis Date Noted  . Osteoarthritis of left knee 10/28/2017  . Primary osteoarthritis of right hip 12/03/2016  . Osteoarthritis of right hip 12/03/2016  . Spinal stenosis, lumbar region, with neurogenic claudication 05/22/2015   Teena Irani, PTA/CLT [phone]  Teena Irani 11/29/2017, 11:45 AM  [place] [address] [place], [place], [address] Phone: [phone]   Fax:  [phone]  Name: ALMETTA LIDDICOAT MRN: [id number] Date of Birth: [birth year]

## 2017-12-01 ENCOUNTER — Ambulatory Visit (HOSPITAL_COMMUNITY): Payer: Managed Care, Other (non HMO)

## 2017-12-01 ENCOUNTER — Encounter (HOSPITAL_COMMUNITY): Payer: Self-pay

## 2017-12-01 DIAGNOSIS — R2689 Other abnormalities of gait and mobility: Secondary | ICD-10-CM

## 2017-12-01 DIAGNOSIS — M6281 Muscle weakness (generalized): Secondary | ICD-10-CM

## 2017-12-01 DIAGNOSIS — M25662 Stiffness of left knee, not elsewhere classified: Secondary | ICD-10-CM

## 2017-12-01 DIAGNOSIS — M25562 Pain in left knee: Secondary | ICD-10-CM

## 2017-12-01 DIAGNOSIS — R29898 Other symptoms and signs involving the musculoskeletal system: Secondary | ICD-10-CM

## 2017-12-01 NOTE — Therapy (Signed)
[address] [place], [place], [address] Phone: [phone]   Fax:  [phone]  Physical Therapy Treatment  Patient Details  Name: Danielle Gray MRN: [id number] Date of Birth: 1950-09-18 Referring Provider: Rod Can, MD   Encounter Date: 12/01/2017  PT End of Session - 12/01/17 0823    Visit Number  11    Number of Visits  19    Date for PT Re-Evaluation  12/21/17   Minireassess complete 11/29/17   Authorization Type  [employer] managed (based on medical necessity)    Authorization Time Period  11/09/17 - 12/21/17    Authorization - Visit Number  11    Authorization - Number of Visits  60    PT Start Time  0815   4' on bike, not included in charges   PT Stop Time  0900    PT Time Calculation (min)  45 min    Activity Tolerance  Patient tolerated treatment well    Behavior During Therapy  [facility] for tasks assessed/performed       Past Medical History:  Diagnosis Date  . Arthritis   . Back pain   . GERD (gastroesophageal reflux disease)   . 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 Hypertension    changed diet, weight loss, no longer prescribed medication  . Lumbar herniated disc   . Spinal stenosis   . Spondylosis     Past Surgical History:  Procedure Laterality Date  . BLADDER TACK  25 YRS AGO  . BREAST BIOPSY    . colonscopy     . JOINT REPLACEMENT  2006   RT TOTAL KNEE  . KNEE ARTHROPLASTY Left 10/28/2017   Procedure: LEFT TOTAL KNEE ARTHROPLASTY WITH COMPUTER NAVIGATION;  Surgeon: Rod Can, MD;  Location: [facility];  Service: Orthopedics;  Laterality: Left;  Needs RNFA  . LUMBAR LAMINECTOMY/DECOMPRESSION MICRODISCECTOMY Left 05/22/2015   Procedure: CENTRAL DECOMPRESSION LUMBAR LAMINECTOMY L3-4 AND L4-5, 2 LEVEL SPINAL STENOSIS WITH MICRODISCECTOMY L3-4 ON LEFT AND FORAMINOTOMY L3-4 AND L4-5 LEFT;  Surgeon: Latanya Maudlin, MD;  Location: [facility];  Service: Orthopedics;  Laterality: Left;  .  TOTAL HIP ARTHROPLASTY Left 03/26/2016   Procedure: LEFT TOTAL HIP ARTHROPLASTY ANTERIOR APPROACH;  Surgeon: Rod Can, MD;  Location: [facility];  Service: Orthopedics;  Laterality: Left;  Nedds RNFA  . TOTAL HIP ARTHROPLASTY Right 12/03/2016   Procedure: RIGHT TOTAL HIP ARTHROPLASTY ANTERIOR APPROACH;  Surgeon: Rod Can, MD;  Location: [facility];  Service: Orthopedics;  Laterality: Right;  Needs RNFA  . TOTAL KNEE ARTHROPLASTY Right    Dr. Theda Sers  . TUBAL LIGATION    . UPPER GI ENDOSCOPY      There were no vitals filed for this visit.  Subjective Assessment - 12/01/17 0817    Subjective  Pt stated she had a lot of swelling yesterday, applied ice regularly and just mainly stiffness now.  No reports of pain currently.      Pertinent History  Left TKA 10/28/17, Right THA 12/03/16, Left THA 03/26/16, Central decompression lumbar laminectomy 2017, Right TKA 13 years ago    Patient Stated Goals  Get back to being able to walk without pain    Currently in Pain?  No/denies    Pain Descriptors / Indicators  Tightness                       [facility] Adult PT Treatment/Exercise -  12/01/17 0001      Exercises   Exercises  Knee/Hip      Knee/Hip Exercises: Stretches   Active Hamstring Stretch  30 seconds;2 reps    Active Hamstring Stretch Limitations  12in step    Quad Stretch  3 reps;30 seconds    Quad Stretch Limitations  prone with rope    Knee: Self-Stretch to increase Flexion  Left;10 seconds;Limitations    Knee: Self-Stretch Limitations  Knee drives on 44HQ step 75F 10" holds for flexion    Gastroc Stretch  2 reps;30 seconds;Limitations    Gastroc Stretch Limitations  With slant board      Knee/Hip Exercises: Aerobic   Stationary Bike  4 minutes on stationary bike for improved ROM seat 11 no resistance. Full revolutions      Knee/Hip Exercises: Standing   Heel Raises  15 reps    Heel Raises Limitations  squat then heel raise with yellow ball    Lateral Step Up   Left;15 reps;Hand Hold: 1;Step Height: 6"    Forward Step Up  Left;15 reps;Hand Hold: 0;Step Height: 6"    Step Down  Left;10 reps;Hand Hold: 1;Step Height: 6"    Functional Squat  15 reps;Limitations    Functional Squat Limitations  squat then heel raise with yellow ball    SLS  Lt 7", Rt 18"    SLS with Vectors  5x5" Lt LE with intermittent    Other Standing Knee Exercises  Tandem stance on foam wiht palov RTB;  sidestep 2RT RTB    Other Standing Knee Exercises  sidestep RTB 2RT      Knee/Hip Exercises: Supine   Knee Extension  AROM    Knee Extension Limitations  2    Knee Flexion  AROM    Knee Flexion Limitations  113      Manual Therapy   Manual Therapy  Edema management    Manual therapy comments  Manual complete separate than of tx    Edema Management  Retro massage with LE elevated                PT Short Term Goals - 11/29/17 1638      PT SHORT TERM GOAL #1   Title  Patient will demonstrate understanding and report regular compliance with HEP in order to improve knee mobility, lower extremity strength, and overall functional mobility.     Time  3    Period  Weeks    Status  Achieved      PT SHORT TERM GOAL #2   Title  Patient will demonstrate improvement of 1/2 MMT grade in all deficient muscle groups to assist patient with improved mechanics with gait and stair ambulation.     Time  3    Period  Weeks    Status  Achieved      PT SHORT TERM GOAL #3   Title  Patient will demonstrate ability to maintain single limb stance for at least 3 seconds on the left lower extremity in order to assist with improved safety and independence with stair ambulation.     Time  3    Period  Weeks    Status  Achieved      PT SHORT TERM GOAL #4   Title  Patient will demonstrate left knee extension/flexion active range of motion of at least 0-95 degrees to assist with more normalized gait pattern and stair ambulation.    Baseline  2-110    Time  3  Period  Weeks    Status   Partially Met        PT Long Term Goals - 11/29/17 0908      PT LONG TERM GOAL #1   Title  Patient will demonstrate left knee extension/flexion active range of motion of at least 0-115 degrees to assist with more normalized gait pattern and stair ambulation.    Time  6    Period  Weeks    Status  On-going      PT LONG TERM GOAL #2   Title  Patient will demonstrate ability to ambulate at a gait velocity of at least 1.2 m/s on the 3MWT with LRAD to improve patient's safety with ambulation at home and in the community.     Baseline  ambulating 1.35 m/s without AD    Time  6    Period  Weeks    Status  Achieved      PT LONG TERM GOAL #3   Title  Patient will demonstrate ability to ascend and descend 8 stairs with reciprocal gait pattern indicating improved safety and confidence with stair ambulation for patient's stairs at home.     Time  6    Period  Weeks    Status  On-going      PT LONG TERM GOAL #4   Title  Patient will demonstrate at least 4+/5 MMT strength in all tested muscle groups in order to improve patient's ease of completing work activities and help patient return to PLOF.     Time  6    Period  Weeks    Status  On-going            Plan - 12/01/17 0901    Clinical Impression Statement  Continued session focus with knee mobility and functional strengthening.  Added vector stance to improve balance and hip stability and continued with balance activities.  Able to increase lateral and forward step down height, pt does continue to show weak eccentric quad control with step down.  No reports of pain through session.   Improved AROM 2-113 degrees (was 2-110 last session>)    Rehab Potential  Good    Clinical Impairments Affecting Rehab Potential  Positive: Social support, positive response from therapy previously. Negative: Multiple lower extremity surgeries    PT Frequency  3x / week    PT Duration  6 weeks    PT Treatment/Interventions  ADLs/Self Care Home  Management;Aquatic Therapy;Cryotherapy;Moist Heat;DME Instruction;Gait training;Stair training;Functional mobility training;Therapeutic activities;Therapeutic exercise;Balance training;Neuromuscular re-education;Patient/family education;Manual techniques;Manual lymph drainage;Scar mobilization;Passive range of motion;Dry needling;Energy conservation;Taping    PT Next Visit Plan  Continue with knee mobility exercises and edema control.  Progress to functional strengthening and balance as able.  Add lunges next session.    PT Home Exercise Plan  HHPT exercises appropriate for now.  11/15/17: quad and gastroc stretches       Patient will benefit from skilled therapeutic intervention in order to improve the following deficits and impairments:  Abnormal gait, Decreased balance, Decreased endurance, Decreased mobility, Difficulty walking, Hypomobility, Decreased range of motion, Decreased scar mobility, Increased edema, Improper body mechanics, Decreased activity tolerance, Decreased strength, Increased fascial restricitons, Impaired flexibility, Pain  Visit Diagnosis: Acute pain of left knee  Muscle weakness (generalized)  Other abnormalities of gait and mobility  Other symptoms and signs involving the musculoskeletal system  Stiffness of left knee, not elsewhere classified     Problem List Patient Active Problem List   Diagnosis Date Noted  .  Osteoarthritis of left knee 10/28/2017  . Primary osteoarthritis of right hip 12/03/2016  . Osteoarthritis of right hip 12/03/2016  . Spinal stenosis, lumbar region, with neurogenic claudication 05/22/2015   Ihor Austin, [place]; [place]  Aldona Lento 12/01/2017, 9:19 AM  [place], [place], [address] Phone: [phone]   Fax:  [phone]  Name: REISE HIETALA MRN: [id number] Date of Birth: 1950-08-31

## 2017-12-03 ENCOUNTER — Ambulatory Visit (HOSPITAL_COMMUNITY): Payer: Managed Care, Other (non HMO) | Admitting: Physical Therapy

## 2017-12-03 ENCOUNTER — Encounter (HOSPITAL_COMMUNITY): Payer: Self-pay | Admitting: Physical Therapy

## 2017-12-03 DIAGNOSIS — R29898 Other symptoms and signs involving the musculoskeletal system: Secondary | ICD-10-CM

## 2017-12-03 DIAGNOSIS — M25662 Stiffness of left knee, not elsewhere classified: Secondary | ICD-10-CM

## 2017-12-03 DIAGNOSIS — M25562 Pain in left knee: Secondary | ICD-10-CM | POA: Diagnosis not present

## 2017-12-03 DIAGNOSIS — R2689 Other abnormalities of gait and mobility: Secondary | ICD-10-CM

## 2017-12-03 DIAGNOSIS — M6281 Muscle weakness (generalized): Secondary | ICD-10-CM

## 2017-12-03 NOTE — Patient Instructions (Signed)
  SINGLE LEG STANCE - FORWARD SLS Stand on one leg and maintain your balance. Next, hold your leg out in front of your body. Then return to original position. Maintain a slightly bent knee on the stance Side. Repeat 5 Times Hold 5 Seconds Complete 1 Set Perform 1 Time(s) a Day   SINGLE LEG STANCE - LATERAL SLS Stand on one leg and maintain your balance. Next, hold your leg out to the side of your body. Then return to original position. Maintain a slightly bent knee on the stance Side. Repeat 5 Times Hold 5 Seconds Complete 1 Set Perform 1 Time(s) a Day   SINGLE LEG STANCE - RETRO SLS Stand on one leg and maintain your balance. Next, hold your leg out behind your body. Then return to original position. Maintain a slightly bent knee on the stance Side. Repeat 5 Times Hold 5 Seconds Complete 1 Set Perform 1 Time(s) a Day

## 2017-12-03 NOTE — Therapy (Addendum)
[place], [place], [address] Phone: [phone]   Fax:  [phone]  Physical Therapy Treatment  Patient Details  Name: Danielle Gray MRN: [id number] Date of Birth: Jan 27, 1951 Referring Provider: Rod Can, MD   Encounter Date: 12/03/2017  PT End of Session - 12/03/17 0905    Visit Number  12    Number of Visits  19    Date for PT Re-Evaluation  12/21/17   Minireassess complete 11/29/17   Authorization Type  [employer] managed (based on medical necessity)    Authorization Time Period  11/09/17 - 12/21/17    Authorization - Visit Number  12    Authorization - Number of Visits  60    PT Start Time  0902    PT Stop Time  0945   4 minutes unbilled for stationary bike   PT Time Calculation (min)  43 min    Activity Tolerance  Patient tolerated treatment well    Behavior During Therapy  [facility] for tasks assessed/performed       Past Medical History:  Diagnosis Date  . Arthritis   . Back pain   . GERD (gastroesophageal reflux disease)   . 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 Hypertension    changed diet, weight loss, no longer prescribed medication  . Lumbar herniated disc   . Spinal stenosis   . Spondylosis     Past Surgical History:  Procedure Laterality Date  . BLADDER TACK  25 YRS AGO  . BREAST BIOPSY    . colonscopy     . JOINT REPLACEMENT  2006   RT TOTAL KNEE  . KNEE ARTHROPLASTY Left 10/28/2017   Procedure: LEFT TOTAL KNEE ARTHROPLASTY WITH COMPUTER NAVIGATION;  Surgeon: Rod Can, MD;  Location: [facility];  Service: Orthopedics;  Laterality: Left;  Needs RNFA  . LUMBAR LAMINECTOMY/DECOMPRESSION MICRODISCECTOMY Left 05/22/2015   Procedure: CENTRAL DECOMPRESSION LUMBAR LAMINECTOMY L3-4 AND L4-5, 2 LEVEL SPINAL STENOSIS WITH MICRODISCECTOMY L3-4 ON LEFT AND FORAMINOTOMY L3-4 AND L4-5 LEFT;  Surgeon: Latanya Maudlin, MD;  Location: [facility];  Service: Orthopedics;  Laterality: Left;   . TOTAL HIP ARTHROPLASTY Left 03/26/2016   Procedure: LEFT TOTAL HIP ARTHROPLASTY ANTERIOR APPROACH;  Surgeon: Rod Can, MD;  Location: [facility];  Service: Orthopedics;  Laterality: Left;  Nedds RNFA  . TOTAL HIP ARTHROPLASTY Right 12/03/2016   Procedure: RIGHT TOTAL HIP ARTHROPLASTY ANTERIOR APPROACH;  Surgeon: Rod Can, MD;  Location: [facility];  Service: Orthopedics;  Laterality: Right;  Needs RNFA  . TOTAL KNEE ARTHROPLASTY Right    Dr. Theda Sers  . TUBAL LIGATION    . UPPER GI ENDOSCOPY      There were no vitals filed for this visit.  Subjective Assessment - 12/03/17 0903    Subjective  Patient reported she feels like she is doing well with her exercises at home. She stated that she did more walking yesterday.     Pertinent History  Left TKA 10/28/17, Right THA 12/03/16, Left THA 03/26/16, Central decompression lumbar laminectomy 2017, Right TKA 13 years ago    Patient Stated Goals  Get back to being able to walk without pain    Currently in Pain?  No/denies                       [facility] Adult PT Treatment/Exercise - 12/03/17 0001      Knee/Hip Exercises: Stretches  Active Hamstring Stretch  30 seconds;2 reps    Active Hamstring Stretch Limitations  12in step    Knee: Self-Stretch to increase Flexion  Left;10 seconds;Limitations    Knee: Self-Stretch Limitations  Knee drives on 96PR step 91M 10" holds for flexion    Gastroc Stretch  2 reps;30 seconds;Limitations    Gastroc Stretch Limitations  With slant board      Knee/Hip Exercises: Aerobic   Stationary Bike  4 minutes on stationary bike for improved ROM seat 11 no resistance. Full revolutions      Knee/Hip Exercises: Standing   Heel Raises  15 reps    Forward Lunges  Left;15 reps    Lateral Step Up  Left;15 reps;Step Height: 6";Hand Hold: 0    Forward Step Up  Left;15 reps;Hand Hold: 0;Step Height: 6"    Step Down  Left;Hand Hold: 1;Step Height: 6";15 reps    Functional Squat  15 reps;Limitations     Functional Squat Limitations  squat then heel raise with yellow ball    SLS  Lt. 3 seconds, Right 8 seconds    SLS with Vectors  5x5" Lt and Rt LE with intermittent HHA    Other Standing Knee Exercises  Tandem stance on foam with palov RTB x 20 each LE forward;  sidestep 14 feet 2RT RTB around ankles      Knee/Hip Exercises: Supine   Knee Extension  AROM    Knee Extension Limitations  2    Knee Flexion  AROM    Knee Flexion Limitations  114      Manual Therapy   Manual Therapy  Edema management    Manual therapy comments  Manual complete separate than of tx    Edema Management  Retro massage to the left lower extremity with LE elevated. Grade III PA and AP glides at the tibiofemoral joint to patient's left knee to improve knee flexion and extension 3x30-45 seconds each.              PT Education - 12/03/17 0904    Education Details  Discussed purpose and technique of exercises throughout session.     Person(s) Educated  Patient    Methods  Explanation;Demonstration    Comprehension  Verbalized understanding;Returned demonstration       PT Short Term Goals - 11/29/17 0907      PT SHORT TERM GOAL #1   Title  Patient will demonstrate understanding and report regular compliance with HEP in order to improve knee mobility, lower extremity strength, and overall functional mobility.     Time  3    Period  Weeks    Status  Achieved      PT SHORT TERM GOAL #2   Title  Patient will demonstrate improvement of 1/2 MMT grade in all deficient muscle groups to assist patient with improved mechanics with gait and stair ambulation.     Time  3    Period  Weeks    Status  Achieved      PT SHORT TERM GOAL #3   Title  Patient will demonstrate ability to maintain single limb stance for at least 3 seconds on the left lower extremity in order to assist with improved safety and independence with stair ambulation.     Time  3    Period  Weeks    Status  Achieved      PT SHORT TERM GOAL #4    Title  Patient will demonstrate left knee extension/flexion active range of  motion of at least 0-95 degrees to assist with more normalized gait pattern and stair ambulation.    Baseline  2-110    Time  3    Period  Weeks    Status  Partially Met        PT Long Term Goals - 11/29/17 0908      PT LONG TERM GOAL #1   Title  Patient will demonstrate left knee extension/flexion active range of motion of at least 0-115 degrees to assist with more normalized gait pattern and stair ambulation.    Time  6    Period  Weeks    Status  On-going      PT LONG TERM GOAL #2   Title  Patient will demonstrate ability to ambulate at a gait velocity of at least 1.2 m/s on the 3MWT with LRAD to improve patient's safety with ambulation at home and in the community.     Baseline  ambulating 1.35 m/s without AD    Time  6    Period  Weeks    Status  Achieved      PT LONG TERM GOAL #3   Title  Patient will demonstrate ability to ascend and descend 8 stairs with reciprocal gait pattern indicating improved safety and confidence with stair ambulation for patient's stairs at home.     Time  6    Period  Weeks    Status  On-going      PT LONG TERM GOAL #4   Title  Patient will demonstrate at least 4+/5 MMT strength in all tested muscle groups in order to improve patient's ease of completing work activities and help patient return to PLOF.     Time  6    Period  Weeks    Status  On-going            Plan - 12/03/17 1050    Clinical Impression Statement  This session continued with improving patient's knee mobility and improving functional lower extremity strength. This session added forward lunges to improve patient's lower extremity strength, using demonstration for proper form. This session patient also demonstrated ability to perform lateral step ups without hand hold assist. Patient continued to demonstrate difficulty with single leg stance on each lower extremity. Verbal cues for balance to stand  tall and lift up. This session patient's left knee AROM ranged from 2 degrees extension to 114 degrees of flexion. This session added single leg vector stance to patient's HEP at a support surface. Patient would benefit from continued skilled physical therapy in order to continue progress towards functional goals.    Rehab Potential  Good    Clinical Impairments Affecting Rehab Potential  Positive: Social support, positive response from therapy previously. Negative: Multiple lower extremity surgeries    PT Frequency  3x / week    PT Duration  6 weeks    PT Treatment/Interventions  ADLs/Self Care Home Management;Aquatic Therapy;Cryotherapy;Moist Heat;DME Instruction;Gait training;Stair training;Functional mobility training;Therapeutic activities;Therapeutic exercise;Balance training;Neuromuscular re-education;Patient/family education;Manual techniques;Manual lymph drainage;Scar mobilization;Passive range of motion;Dry needling;Energy conservation;Taping    PT Next Visit Plan  Continue with knee mobility exercises and edema control, but begin to integrate functional strengthening more.  Progress to functional strengthening and balance as able.     PT Home Exercise Plan  HHPT exercises appropriate for now.  11/15/17: quad and gastroc stretches; 12/03/17: Vector stance forward, side, back 5 x 5 second holds each lower extremity 1x/day       Patient will benefit from  skilled therapeutic intervention in order to improve the following deficits and impairments:  Abnormal gait, Decreased balance, Decreased endurance, Decreased mobility, Difficulty walking, Hypomobility, Decreased range of motion, Decreased scar mobility, Increased edema, Improper body mechanics, Decreased activity tolerance, Decreased strength, Increased fascial restricitons, Impaired flexibility, Pain  Visit Diagnosis: Acute pain of left knee  Muscle weakness (generalized)  Other abnormalities of gait and mobility  Other symptoms and signs  involving the musculoskeletal system  Stiffness of left knee, not elsewhere classified     Problem List Patient Active Problem List   Diagnosis Date Noted  . Osteoarthritis of left knee 10/28/2017  . Primary osteoarthritis of right hip 12/03/2016  . Osteoarthritis of right hip 12/03/2016  . Spinal stenosis, lumbar region, with neurogenic claudication 05/22/2015   Clarene Critchley PT, DPT 10:56 AM, 12/03/17 [place], [place], [address] Phone: [phone]   Fax:  [phone]  Name: Danielle Gray MRN: [id number] Date of Birth: 14-Jan-1951

## 2017-12-06 ENCOUNTER — Encounter (HOSPITAL_COMMUNITY): Payer: Managed Care, Other (non HMO) | Admitting: Physical Therapy

## 2017-12-08 ENCOUNTER — Encounter (HOSPITAL_COMMUNITY): Payer: Managed Care, Other (non HMO) | Admitting: Physical Therapy

## 2017-12-10 ENCOUNTER — Ambulatory Visit (HOSPITAL_COMMUNITY): Payer: Managed Care, Other (non HMO)

## 2017-12-10 ENCOUNTER — Telehealth (HOSPITAL_COMMUNITY): Payer: Self-pay

## 2017-12-10 NOTE — Telephone Encounter (Signed)
She woke up sick and can not be here

## 2017-12-13 ENCOUNTER — Ambulatory Visit (HOSPITAL_COMMUNITY): Payer: Managed Care, Other (non HMO) | Admitting: Physical Therapy

## 2017-12-13 DIAGNOSIS — M25562 Pain in left knee: Secondary | ICD-10-CM

## 2017-12-13 DIAGNOSIS — R2689 Other abnormalities of gait and mobility: Secondary | ICD-10-CM

## 2017-12-13 DIAGNOSIS — R29898 Other symptoms and signs involving the musculoskeletal system: Secondary | ICD-10-CM

## 2017-12-13 DIAGNOSIS — M6281 Muscle weakness (generalized): Secondary | ICD-10-CM

## 2017-12-13 DIAGNOSIS — M25662 Stiffness of left knee, not elsewhere classified: Secondary | ICD-10-CM

## 2017-12-13 NOTE — Therapy (Signed)
[place], [place], [address] Phone: [phone]   Fax:  [phone]  Physical Therapy Treatment  Patient Details  Name: Danielle Gray MRN: [id number] Date of Birth: 30-Aug-1950 Referring Provider: Lyla Glassing   Encounter Date: 12/13/2017  PT End of Session - 12/13/17 0953    Visit Number  13    Number of Visits  19    Date for PT Re-Evaluation  12/21/17   Minireassess complete 11/29/17   Authorization Type  [employer] managed (based on medical necessity)    Authorization Time Period  11/09/17 - 12/21/17    Authorization - Visit Number  13    Authorization - Number of Visits  60    PT Start Time  0820    PT Stop Time  0904    PT Time Calculation (min)  44 min    Activity Tolerance  Patient tolerated treatment well    Behavior During Therapy  [facility] for tasks assessed/performed       Past Medical History:  Diagnosis Date  . Arthritis   . Back pain   . GERD (gastroesophageal reflux disease)   . 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 Hypertension    changed diet, weight loss, no longer prescribed medication  . Lumbar herniated disc   . Spinal stenosis   . Spondylosis     Past Surgical History:  Procedure Laterality Date  . BLADDER TACK  25 YRS AGO  . BREAST BIOPSY    . colonscopy     . JOINT REPLACEMENT  2006   RT TOTAL KNEE  . KNEE ARTHROPLASTY Left 10/28/2017   Procedure: LEFT TOTAL KNEE ARTHROPLASTY WITH COMPUTER NAVIGATION;  Surgeon: Rod Can, MD;  Location: [facility];  Service: Orthopedics;  Laterality: Left;  Needs RNFA  . LUMBAR LAMINECTOMY/DECOMPRESSION MICRODISCECTOMY Left 05/22/2015   Procedure: CENTRAL DECOMPRESSION LUMBAR LAMINECTOMY L3-4 AND L4-5, 2 LEVEL SPINAL STENOSIS WITH MICRODISCECTOMY L3-4 ON LEFT AND FORAMINOTOMY L3-4 AND L4-5 LEFT;  Surgeon: Latanya Maudlin, MD;  Location: [facility];  Service: Orthopedics;  Laterality: Left;  . TOTAL HIP ARTHROPLASTY Left 03/26/2016   Procedure: LEFT TOTAL HIP ARTHROPLASTY ANTERIOR APPROACH;  Surgeon: Rod Can, MD;  Location: [facility];  Service: Orthopedics;  Laterality: Left;  Nedds RNFA  . TOTAL HIP ARTHROPLASTY Right 12/03/2016   Procedure: RIGHT TOTAL HIP ARTHROPLASTY ANTERIOR APPROACH;  Surgeon: Rod Can, MD;  Location: [facility];  Service: Orthopedics;  Laterality: Right;  Needs RNFA  . TOTAL KNEE ARTHROPLASTY Right    Dr. Theda Sers  . TUBAL LIGATION    . UPPER GI ENDOSCOPY      There were no vitals filed for this visit.  Subjective Assessment - 12/13/17 0827    Subjective  Pt reports she has no pain.  States her biggest issue is steps.    Currently in Pain?  No/denies         [facility] PT Assessment - 12/13/17 5027      Assessment   Medical Diagnosis  Presence of Left artificial knee joint    Referring Provider  [place]    Onset Date/Surgical Date  12/13/17      Observation/Other Assessments   Focus on Therapeutic Outcomes (FOTO)   --      Observation/Other Assessments-Edema    Edema  Circumferential      Circumferential Edema   Circumferential - Right  46.5 cm    Circumferential - Left   47  was 51.5 cm     AROM   Left Knee Extension  0   was lacking 5 degrees   Left Knee Flexion  117   was 80 degrees, Rt knee is 0-115     Strength   Right Hip Flexion  5/5   was 4+/5   Right Hip Extension  5/5   was 4/5   Left Hip Flexion  4+/5   was 4/5   Left Hip Extension  4+/5   was 3+/5   Right Knee Flexion  5/5   was 5/5   Right Knee Extension  5/5   was 5/5   Left Knee Flexion  5/5   was 4+/5   Left Knee Extension  4+/5   was 4+/5   Right Ankle Dorsiflexion  5/5   was 5/5   Left Ankle Dorsiflexion  5/5   was 4+/5     Ambulation/Gait   Ambulation/Gait  Yes   was completed on 11/29/17   Ambulation Distance (Feet)  800 Feet   was 452 feet with RW   Assistive device  None    Gait Pattern  Within Functional Limits    Gait velocity  1.35 m/sec    Stairs  Yes    Stairs Assistance   6: Modified independent (Device/Increase time)    Stair Management Technique  One rail Right;Other (comment);Alternating pattern    Number of Stairs  8    Height of Stairs  7      Static Standing Balance   Static Standing - Balance Support  No upper extremity supported    Static Standing Balance -  Activities   Single Leg Stance - Right Leg;Single Leg Stance - Left Leg    Static Standing - Comment/# of Minutes  max of 3 Lt: 14", Rt: 30"                   [facility] Adult PT Treatment/Exercise - 12/13/17 0828      Knee/Hip Exercises: Stretches   Active Hamstring Stretch  30 seconds;2 reps    Active Hamstring Stretch Limitations  12in step    Knee: Self-Stretch to increase Flexion  Left;10 seconds;Limitations    Knee: Self-Stretch Limitations  Knee drives on 38HW step 29H 10" holds for flexion    Gastroc Stretch  2 reps;30 seconds;Limitations    Gastroc Stretch Limitations  With slant board      Knee/Hip Exercises: Standing   Lateral Step Up  Left;15 reps;Step Height: 6";Hand Hold: 0    Forward Step Up  Left;15 reps;Hand Hold: 0;Step Height: 6"    Step Down  Left;Hand Hold: 1;Step Height: 6";15 reps    Functional Squat  15 reps;Limitations    Functional Squat Limitations  squat then heel raise with yellow ball    SLS  Lt. 14 seconds    SLS with Vectors  10x5" Lt with intermittent HHA    Other Standing Knee Exercises  sidestep RTB 2RT      Knee/Hip Exercises: Supine   Knee Extension  AROM    Knee Extension Limitations  2    Knee Flexion  AROM    Knee Flexion Limitations  114      Manual Therapy   Manual Therapy  Edema management    Manual therapy comments  Manual complete separate than of tx    Edema Management  Retro massage to the left lower extremity with LE elevated. Grade III PA and AP glides at the tibiofemoral joint to patient's  left knee to improve knee flexion and extension 3x30-45 seconds each.                PT Short Term Goals - 12/13/17 0954       PT SHORT TERM GOAL #1   Title  Patient will demonstrate understanding and report regular compliance with HEP in order to improve knee mobility, lower extremity strength, and overall functional mobility.     Time  3    Period  Weeks    Status  Achieved      PT SHORT TERM GOAL #2   Title  Patient will demonstrate improvement of 1/2 MMT grade in all deficient muscle groups to assist patient with improved mechanics with gait and stair ambulation.     Time  3    Period  Weeks    Status  Achieved      PT SHORT TERM GOAL #3   Title  Patient will demonstrate ability to maintain single limb stance for at least 3 seconds on the left lower extremity in order to assist with improved safety and independence with stair ambulation.     Time  3    Period  Weeks    Status  Achieved      PT SHORT TERM GOAL #4   Title  Patient will demonstrate left knee extension/flexion active range of motion of at least 0-95 degrees to assist with more normalized gait pattern and stair ambulation.    Baseline  0-117    Time  3    Period  Weeks    Status  Achieved        PT Long Term Goals - 12/13/17 0955      PT LONG TERM GOAL #1   Title  Patient will demonstrate left knee extension/flexion active range of motion of at least 0-115 degrees to assist with more normalized gait pattern and stair ambulation.    Time  6    Period  Weeks    Status  Achieved      PT LONG TERM GOAL #2   Title  Patient will demonstrate ability to ambulate at a gait velocity of at least 1.2 m/s on the 3MWT with LRAD to improve patient's safety with ambulation at home and in the community.     Baseline  ambulating 1.35 m/s without AD    Time  6    Period  Weeks    Status  Achieved      PT LONG TERM GOAL #3   Title  Patient will demonstrate ability to ascend and descend 8 stairs with reciprocal gait pattern indicating improved safety and confidence with stair ambulation for patient's stairs at home.     Time  6    Period  Weeks     Status  Partially Met      PT LONG TERM GOAL #4   Title  Patient will demonstrate at least 4+/5 MMT strength in all tested muscle groups in order to improve patient's ease of completing work activities and help patient return to PLOF.     Time  6    Period  Weeks    Status  On-going            Plan - 12/13/17 0957    Clinical Impression Statement  Pt is progressing nicely and has met all short term goals and 2/4 long term goals.  Pt major limitation at this point is stair negotiation and strength of Lt hip flexors/abductor and knee  extension.  AROM is now 0-117 (Rt is 0-115).    Focus for the remaining visits will focus on high level strengthening and stair negotiation to prepare for return to work.     Rehab Potential  Good    Clinical Impairments Affecting Rehab Potential  Positive: Social support, positive response from therapy previously. Negative: Multiple lower extremity surgeries    PT Frequency  3x / week    PT Duration  6 weeks    PT Treatment/Interventions  ADLs/Self Care Home Management;Aquatic Therapy;Cryotherapy;Moist Heat;DME Instruction;Gait training;Stair training;Functional mobility training;Therapeutic activities;Therapeutic exercise;Balance training;Neuromuscular re-education;Patient/family education;Manual techniques;Manual lymph drainage;Scar mobilization;Passive range of motion;Dry needling;Energy conservation;Taping    PT Next Visit Plan  Continue with knee mobility exercises and edema control, but begin to integrate functional strengthening more.  Progress to functional strengthening and balance as able.     PT Home Exercise Plan  HHPT exercises appropriate for now.  11/15/17: quad and gastroc stretches; 12/03/17: Vector stance forward, side, back 5 x 5 second holds each lower extremity 1x/day       Patient will benefit from skilled therapeutic intervention in order to improve the following deficits and impairments:  Abnormal gait, Decreased balance, Decreased  endurance, Decreased mobility, Difficulty walking, Hypomobility, Decreased range of motion, Decreased scar mobility, Increased edema, Improper body mechanics, Decreased activity tolerance, Decreased strength, Increased fascial restricitons, Impaired flexibility, Pain  Visit Diagnosis: Acute pain of left knee  Muscle weakness (generalized)  Other abnormalities of gait and mobility  Other symptoms and signs involving the musculoskeletal system  Stiffness of left knee, not elsewhere classified     Problem List Patient Active Problem List   Diagnosis Date Noted  . Osteoarthritis of left knee 10/28/2017  . Primary osteoarthritis of right hip 12/03/2016  . Osteoarthritis of right hip 12/03/2016  . Spinal stenosis, lumbar region, with neurogenic claudication 05/22/2015   Teena Irani, PTA/CLT [phone]  Teena Irani 12/13/2017, 10:03 AM  [place], [place], [address] Phone: [phone]   Fax:  [phone]  Name: Danielle Gray MRN: [id number] Date of Birth: Sep 02, 1950

## 2017-12-15 ENCOUNTER — Ambulatory Visit (HOSPITAL_COMMUNITY): Payer: Managed Care, Other (non HMO) | Admitting: Physical Therapy

## 2017-12-15 ENCOUNTER — Encounter (HOSPITAL_COMMUNITY): Payer: Self-pay | Admitting: Physical Therapy

## 2017-12-15 DIAGNOSIS — M6281 Muscle weakness (generalized): Secondary | ICD-10-CM

## 2017-12-15 DIAGNOSIS — R2689 Other abnormalities of gait and mobility: Secondary | ICD-10-CM

## 2017-12-15 DIAGNOSIS — M25662 Stiffness of left knee, not elsewhere classified: Secondary | ICD-10-CM

## 2017-12-15 DIAGNOSIS — M25562 Pain in left knee: Secondary | ICD-10-CM

## 2017-12-15 DIAGNOSIS — R29898 Other symptoms and signs involving the musculoskeletal system: Secondary | ICD-10-CM

## 2017-12-15 NOTE — Therapy (Signed)
[place] [address] [place], [place], [address] Phone: [phone]   Fax:  [phone]  Physical Therapy Treatment  Patient Details  Name: Danielle Gray MRN: [id number] Date of Birth: 04-Sep-1950 Referring Provider: Lyla Glassing   Encounter Date: 12/15/2017  PT End of Session - 12/15/17 0823    Visit Number  14    Number of Visits  19    Date for PT Re-Evaluation  12/21/17   Minireassess complete 11/29/17   Authorization Type  [employer] managed (based on medical necessity)    Authorization Time Period  11/09/17 - 12/21/17    Authorization - Visit Number  14    Authorization - Number of Visits  60    PT Start Time  7564    PT Stop Time  0855    PT Time Calculation (min)  38 min    Activity Tolerance  Patient tolerated treatment well    Behavior During Therapy  [facility] for tasks assessed/performed       Past Medical History:  Diagnosis Date  . Arthritis   . Back pain   . GERD (gastroesophageal reflux disease)   . 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 Hypertension    changed diet, weight loss, no longer prescribed medication  . Lumbar herniated disc   . Spinal stenosis   . Spondylosis     Past Surgical History:  Procedure Laterality Date  . BLADDER TACK  25 YRS AGO  . BREAST BIOPSY    . colonscopy     . JOINT REPLACEMENT  2006   RT TOTAL KNEE  . KNEE ARTHROPLASTY Left 10/28/2017   Procedure: LEFT TOTAL KNEE ARTHROPLASTY WITH COMPUTER NAVIGATION;  Surgeon: Rod Can, MD;  Location: [facility];  Service: Orthopedics;  Laterality: Left;  Needs RNFA  . LUMBAR LAMINECTOMY/DECOMPRESSION MICRODISCECTOMY Left 05/22/2015   Procedure: CENTRAL DECOMPRESSION LUMBAR LAMINECTOMY L3-4 AND L4-5, 2 LEVEL SPINAL STENOSIS WITH MICRODISCECTOMY L3-4 ON LEFT AND FORAMINOTOMY L3-4 AND L4-5 LEFT;  Surgeon: Latanya Maudlin, MD;  Location: [facility];  Service: Orthopedics;  Laterality: Left;  . TOTAL HIP ARTHROPLASTY Left 03/26/2016    Procedure: LEFT TOTAL HIP ARTHROPLASTY ANTERIOR APPROACH;  Surgeon: Rod Can, MD;  Location: [facility];  Service: Orthopedics;  Laterality: Left;  Nedds RNFA  . TOTAL HIP ARTHROPLASTY Right 12/03/2016   Procedure: RIGHT TOTAL HIP ARTHROPLASTY ANTERIOR APPROACH;  Surgeon: Rod Can, MD;  Location: [facility];  Service: Orthopedics;  Laterality: Right;  Needs RNFA  . TOTAL KNEE ARTHROPLASTY Right    Dr. Theda Sers  . TUBAL LIGATION    . UPPER GI ENDOSCOPY      There were no vitals filed for this visit.  Subjective Assessment - 12/15/17 3329    Subjective  Patient reported that she saw her physician and stated that she is doing well.     Currently in Pain?  No/denies                       [facility] Adult PT Treatment/Exercise - 12/15/17 0001      Knee/Hip Exercises: Stretches   Gastroc Stretch  2 reps;30 seconds;Limitations    Press photographer Limitations  With slant board      Knee/Hip Exercises: Machines for Strengthening   Total Gym Leg Press  Leg press plate 4 x 10 double leg      Knee/Hip Exercises: Standing   Forward Lunges  Left;15 reps  Onto BOSU   Lateral Step Up  Left;15 reps;Hand Hold: 0;Step Height: 8"    Forward Step Up  Left;15 reps;Hand Hold: 0;Step Height: 8"    Step Down  Left;15 reps;Step Height: 2";Step Height: 8"    Functional Squat  15 reps;Limitations    Functional Squat Limitations  squat then heel raise with yellow ball    SLS with Vectors  10x5" Lt with intermittent HHA    Other Standing Knee Exercises  Wall squat x 10 with 3 second holds. Side lunge with left lower extremity onto BOSU x15    Other Standing Knee Exercises  sidestep Green theraband 14 feet x 2RT             PT Education - 12/15/17 1443    Education Details  Discussed purpose and technique of exercises throughout session.     Person(s) Educated  Patient    Methods  Explanation    Comprehension  Verbalized understanding       PT Short Term Goals - 12/13/17 0954       PT SHORT TERM GOAL #1   Title  Patient will demonstrate understanding and report regular compliance with HEP in order to improve knee mobility, lower extremity strength, and overall functional mobility.     Time  3    Period  Weeks    Status  Achieved      PT SHORT TERM GOAL #2   Title  Patient will demonstrate improvement of 1/2 MMT grade in all deficient muscle groups to assist patient with improved mechanics with gait and stair ambulation.     Time  3    Period  Weeks    Status  Achieved      PT SHORT TERM GOAL #3   Title  Patient will demonstrate ability to maintain single limb stance for at least 3 seconds on the left lower extremity in order to assist with improved safety and independence with stair ambulation.     Time  3    Period  Weeks    Status  Achieved      PT SHORT TERM GOAL #4   Title  Patient will demonstrate left knee extension/flexion active range of motion of at least 0-95 degrees to assist with more normalized gait pattern and stair ambulation.    Baseline  0-117    Time  3    Period  Weeks    Status  Achieved        PT Long Term Goals - 12/13/17 0955      PT LONG TERM GOAL #1   Title  Patient will demonstrate left knee extension/flexion active range of motion of at least 0-115 degrees to assist with more normalized gait pattern and stair ambulation.    Time  6    Period  Weeks    Status  Achieved      PT LONG TERM GOAL #2   Title  Patient will demonstrate ability to ambulate at a gait velocity of at least 1.2 m/s on the 3MWT with LRAD to improve patient's safety with ambulation at home and in the community.     Baseline  ambulating 1.35 m/s without AD    Time  6    Period  Weeks    Status  Achieved      PT LONG TERM GOAL #3   Title  Patient will demonstrate ability to ascend and descend 8 stairs with reciprocal gait pattern indicating improved safety and confidence with stair  ambulation for patient's stairs at home.     Time  6    Period  Weeks     Status  Partially Met      PT LONG TERM GOAL #4   Title  Patient will demonstrate at least 4+/5 MMT strength in all tested muscle groups in order to improve patient's ease of completing work activities and help patient return to PLOF.     Time  6    Period  Weeks    Status  On-going            Plan - 12/15/17 5672    Clinical Impression Statement  This session focused on lower extremity strengthening and progressing patient with functional strengthening. This session increased step height to 8 inches to challenge patient further as she reported that she has most difficulty with steps. Patient did require 2 hand hold assist when stepping down. This session also added leg press machine to increase strength and also wall squats. Plan to continue with focus on lower extremity strengthening.     Rehab Potential  Good    Clinical Impairments Affecting Rehab Potential  Positive: Social support, positive response from therapy previously. Negative: Multiple lower extremity surgeries    PT Frequency  3x / week    PT Duration  6 weeks    PT Treatment/Interventions  ADLs/Self Care Home Management;Aquatic Therapy;Cryotherapy;Moist Heat;DME Instruction;Gait training;Stair training;Functional mobility training;Therapeutic activities;Therapeutic exercise;Balance training;Neuromuscular re-education;Patient/family education;Manual techniques;Manual lymph drainage;Scar mobilization;Passive range of motion;Dry needling;Energy conservation;Taping    PT Next Visit Plan  Continue with knee mobility exercises and edema control, but begin to integrate functional strengthening more.  Progress to functional strengthening and balance as able.     PT Home Exercise Plan  HHPT exercises appropriate for now.  11/15/17: quad and gastroc stretches; 12/03/17: Vector stance forward, side, back 5 x 5 second holds each lower extremity 1x/day       Patient will benefit from skilled therapeutic intervention in order to improve  the following deficits and impairments:  Abnormal gait, Decreased balance, Decreased endurance, Decreased mobility, Difficulty walking, Hypomobility, Decreased range of motion, Decreased scar mobility, Increased edema, Improper body mechanics, Decreased activity tolerance, Decreased strength, Increased fascial restricitons, Impaired flexibility, Pain  Visit Diagnosis: Acute pain of left knee  Muscle weakness (generalized)  Other abnormalities of gait and mobility  Other symptoms and signs involving the musculoskeletal system  Stiffness of left knee, not elsewhere classified     Problem List Patient Active Problem List   Diagnosis Date Noted  . Osteoarthritis of left knee 10/28/2017  . Primary osteoarthritis of right hip 12/03/2016  . Osteoarthritis of right hip 12/03/2016  . Spinal stenosis, lumbar region, with neurogenic claudication 05/22/2015   Clarene Critchley PT, DPT 8:58 AM, 12/15/17 [place], [place], [address] Phone: [phone]   Fax:  [phone]  Name: Danielle Gray MRN: [id number] Date of Birth: November 21, 1950

## 2017-12-17 ENCOUNTER — Ambulatory Visit (HOSPITAL_COMMUNITY): Payer: Managed Care, Other (non HMO)

## 2017-12-17 ENCOUNTER — Encounter (HOSPITAL_COMMUNITY): Payer: Self-pay

## 2017-12-17 DIAGNOSIS — R2689 Other abnormalities of gait and mobility: Secondary | ICD-10-CM

## 2017-12-17 DIAGNOSIS — M25562 Pain in left knee: Secondary | ICD-10-CM

## 2017-12-17 DIAGNOSIS — M6281 Muscle weakness (generalized): Secondary | ICD-10-CM

## 2017-12-17 DIAGNOSIS — R29898 Other symptoms and signs involving the musculoskeletal system: Secondary | ICD-10-CM

## 2017-12-17 DIAGNOSIS — M25662 Stiffness of left knee, not elsewhere classified: Secondary | ICD-10-CM

## 2017-12-17 NOTE — Therapy (Signed)
[place], [place], [address] Phone: [phone]   Fax:  [phone]  Physical Therapy Treatment  Patient Details  Name: Danielle Gray MRN: [id number] Date of Birth: 1950/07/02 Referring Provider: Lyla Glassing   Encounter Date: 12/17/2017  PT End of Session - 12/17/17 0820    Visit Number  15    Number of Visits  19    Date for PT Re-Evaluation  12/21/17   Minireassess completed 11/29/17   Authorization Type  [employer] managed (based on medical necessity)    Authorization Time Period  11/09/17 - 12/21/17    Authorization - Visit Number  15    Authorization - Number of Visits  60    PT Start Time  0818    PT Stop Time  0900    PT Time Calculation (min)  42 min    Activity Tolerance  Patient tolerated treatment well    Behavior During Therapy  [facility] for tasks assessed/performed       Past Medical History:  Diagnosis Date  . Arthritis   . Back pain   . GERD (gastroesophageal reflux disease)   . 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 Hypertension    changed diet, weight loss, no longer prescribed medication  . Lumbar herniated disc   . Spinal stenosis   . Spondylosis     Past Surgical History:  Procedure Laterality Date  . BLADDER TACK  25 YRS AGO  . BREAST BIOPSY    . colonscopy     . JOINT REPLACEMENT  2006   RT TOTAL KNEE  . KNEE ARTHROPLASTY Left 10/28/2017   Procedure: LEFT TOTAL KNEE ARTHROPLASTY WITH COMPUTER NAVIGATION;  Surgeon: Rod Can, MD;  Location: [facility];  Service: Orthopedics;  Laterality: Left;  Needs RNFA  . LUMBAR LAMINECTOMY/DECOMPRESSION MICRODISCECTOMY Left 05/22/2015   Procedure: CENTRAL DECOMPRESSION LUMBAR LAMINECTOMY L3-4 AND L4-5, 2 LEVEL SPINAL STENOSIS WITH MICRODISCECTOMY L3-4 ON LEFT AND FORAMINOTOMY L3-4 AND L4-5 LEFT;  Surgeon: Latanya Maudlin, MD;  Location: [facility];  Service: Orthopedics;  Laterality: Left;  . TOTAL HIP ARTHROPLASTY Left 03/26/2016   Procedure: LEFT TOTAL HIP ARTHROPLASTY ANTERIOR APPROACH;  Surgeon: Rod Can, MD;  Location: [facility];  Service: Orthopedics;  Laterality: Left;  Nedds RNFA  . TOTAL HIP ARTHROPLASTY Right 12/03/2016   Procedure: RIGHT TOTAL HIP ARTHROPLASTY ANTERIOR APPROACH;  Surgeon: Rod Can, MD;  Location: [facility];  Service: Orthopedics;  Laterality: Right;  Needs RNFA  . TOTAL KNEE ARTHROPLASTY Right    Dr. Theda Sers  . TUBAL LIGATION    . UPPER GI ENDOSCOPY      There were no vitals filed for this visit.  Subjective Assessment - 12/17/17 0819    Subjective  Pt stated her knee is feeling good today, reports last session was helpful with steps.  Continues to have difficulty descending stairs.    Pertinent History  Left TKA 10/28/17, Right THA 12/03/16, Left THA 03/26/16, Central decompression lumbar laminectomy 2017, Right TKA 13 years ago    Patient Stated Goals  Get back to being able to walk without pain    Currently in Pain?  No/denies                       [facility] Adult PT Treatment/Exercise - 12/17/17 0001      Exercises   Exercises  Knee/Hip      Knee/Hip Exercises: Machines for Strengthening  Total Gym Leg Press  Leg press plate 4 x 15 double leg      Knee/Hip Exercises: Standing   Heel Raises  15 reps    Heel Raises Limitations  squat then heel raise with yellow ball    Forward Lunges  Both;2 sets;15 reps;5 reps   1st set on BOSU; 2nd set lunge matrix sliding frontal/sagitt   Lateral Step Up  Left;15 reps;Hand Hold: 0;Step Height: 8"    Forward Step Up  Left;15 reps;Hand Hold: 0;Step Height: 8"    Step Down  Left;15 reps;Step Height: 2";Step Height: 8"    Functional Squat  15 reps;Limitations    Functional Squat Limitations  squat then heel raise with yellow ball    Wall Squat  10 reps;5 seconds    Stairs  3RT reciprocal pattern 1 HR    SLS with Vectors  10x5" Lt with intermittent HHA on foam    Other Standing Knee Exercises  sidestep Green theraband 14 feet x  2RT               PT Short Term Goals - 12/13/17 0954      PT SHORT TERM GOAL #1   Title  Patient will demonstrate understanding and report regular compliance with HEP in order to improve knee mobility, lower extremity strength, and overall functional mobility.     Time  3    Period  Weeks    Status  Achieved      PT SHORT TERM GOAL #2   Title  Patient will demonstrate improvement of 1/2 MMT grade in all deficient muscle groups to assist patient with improved mechanics with gait and stair ambulation.     Time  3    Period  Weeks    Status  Achieved      PT SHORT TERM GOAL #3   Title  Patient will demonstrate ability to maintain single limb stance for at least 3 seconds on the left lower extremity in order to assist with improved safety and independence with stair ambulation.     Time  3    Period  Weeks    Status  Achieved      PT SHORT TERM GOAL #4   Title  Patient will demonstrate left knee extension/flexion active range of motion of at least 0-95 degrees to assist with more normalized gait pattern and stair ambulation.    Baseline  0-117    Time  3    Period  Weeks    Status  Achieved        PT Long Term Goals - 12/13/17 0955      PT LONG TERM GOAL #1   Title  Patient will demonstrate left knee extension/flexion active range of motion of at least 0-115 degrees to assist with more normalized gait pattern and stair ambulation.    Time  6    Period  Weeks    Status  Achieved      PT LONG TERM GOAL #2   Title  Patient will demonstrate ability to ambulate at a gait velocity of at least 1.2 m/s on the 3MWT with LRAD to improve patient's safety with ambulation at home and in the community.     Baseline  ambulating 1.35 m/s without AD    Time  6    Period  Weeks    Status  Achieved      PT LONG TERM GOAL #3   Title  Patient will demonstrate ability to ascend and  descend 8 stairs with reciprocal gait pattern indicating improved safety and confidence with stair  ambulation for patient's stairs at home.     Time  6    Period  Weeks    Status  Partially Met      PT LONG TERM GOAL #4   Title  Patient will demonstrate at least 4+/5 MMT strength in all tested muscle groups in order to improve patient's ease of completing work activities and help patient return to PLOF.     Time  6    Period  Weeks    Status  On-going            Plan - 12/17/17 0973    Clinical Impression Statement  Continued with PT POC for functional strengthening.  Step up training complete with 8in step with noted difficulty descending.  Added lunge matrix for eccentric quad control strengthening to assist with descending stairs.  PT progressing well with increased demand and no reports of pain through session.  Discussion held about progression following discharge, give pt paperwork for 2 week free at [facility] next session.      Rehab Potential  Good    Clinical Impairments Affecting Rehab Potential  Positive: Social support, positive response from therapy previously. Negative: Multiple lower extremity surgeries    PT Frequency  3x / week    PT Duration  6 weeks    PT Treatment/Interventions  ADLs/Self Care Home Management;Aquatic Therapy;Cryotherapy;Moist Heat;DME Instruction;Gait training;Stair training;Functional mobility training;Therapeutic activities;Therapeutic exercise;Balance training;Neuromuscular re-education;Patient/family education;Manual techniques;Manual lymph drainage;Scar mobilization;Passive range of motion;Dry needling;Energy conservation;Taping    PT Next Visit Plan  Reassess next session, give membership info for [place] exercises appropriate for now.  11/15/17: quad and gastroc stretches; 12/03/17: Vector stance forward, side, back 5 x 5 second holds each lower extremity 1x/day       Patient will benefit from skilled therapeutic intervention in order to improve the following deficits and impairments:  Abnormal gait, Decreased balance,  Decreased endurance, Decreased mobility, Difficulty walking, Hypomobility, Decreased range of motion, Decreased scar mobility, Increased edema, Improper body mechanics, Decreased activity tolerance, Decreased strength, Increased fascial restricitons, Impaired flexibility, Pain  Visit Diagnosis: Acute pain of left knee  Other abnormalities of gait and mobility  Other symptoms and signs involving the musculoskeletal system  Stiffness of left knee, not elsewhere classified  Muscle weakness (generalized)     Problem List Patient Active Problem List   Diagnosis Date Noted  . Osteoarthritis of left knee 10/28/2017  . Primary osteoarthritis of right hip 12/03/2016  . Osteoarthritis of right hip 12/03/2016  . Spinal stenosis, lumbar region, with neurogenic claudication 05/22/2015   Ihor Austin, [place]; [place]  Aldona Lento 12/17/2017, 9:04 AM  [place], [place], [address] Phone: [phone]   Fax:  [phone]  Name: CHALEE HIROTA MRN: [id number] Date of Birth: 02/02/1951

## 2017-12-21 ENCOUNTER — Encounter (HOSPITAL_COMMUNITY): Payer: Self-pay | Admitting: Physical Therapy

## 2017-12-21 ENCOUNTER — Ambulatory Visit (HOSPITAL_COMMUNITY): Payer: Managed Care, Other (non HMO) | Attending: Orthopedic Surgery | Admitting: Physical Therapy

## 2017-12-21 DIAGNOSIS — M25562 Pain in left knee: Secondary | ICD-10-CM

## 2017-12-21 DIAGNOSIS — R2689 Other abnormalities of gait and mobility: Secondary | ICD-10-CM | POA: Diagnosis present

## 2017-12-21 DIAGNOSIS — R29898 Other symptoms and signs involving the musculoskeletal system: Secondary | ICD-10-CM | POA: Diagnosis present

## 2017-12-21 DIAGNOSIS — M6281 Muscle weakness (generalized): Secondary | ICD-10-CM | POA: Diagnosis present

## 2017-12-21 DIAGNOSIS — M25662 Stiffness of left knee, not elsewhere classified: Secondary | ICD-10-CM | POA: Insufficient documentation

## 2017-12-21 NOTE — Therapy (Signed)
[place], [place], [address] Phone: [phone]   Fax:  [phone]  Physical Therapy Treatment  Patient Details  Name: Danielle Gray MRN: [id number] Date of Birth: July 17, 1950 Referring Provider: Lyla Glassing   Encounter Date: 12/21/2017  PT End of Session - 12/21/17 0820    Visit Number  16    Number of Visits  19    Date for PT Re-Evaluation  12/21/17   Minireassess completed 11/29/17   Authorization Type  [employer] managed (based on medical necessity)    Authorization Time Period  11/09/17 - 12/21/17    Authorization - Visit Number  16    Authorization - Number of Visits  60    PT Start Time  0817    PT Stop Time  0855    PT Time Calculation (min)  38 min    Activity Tolerance  Patient tolerated treatment well    Behavior During Therapy  [facility] for tasks assessed/performed       Past Medical History:  Diagnosis Date  . Arthritis   . Back pain   . GERD (gastroesophageal reflux disease)   . 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 Hypertension    changed diet, weight loss, no longer prescribed medication  . Lumbar herniated disc   . Spinal stenosis   . Spondylosis     Past Surgical History:  Procedure Laterality Date  . BLADDER TACK  25 YRS AGO  . BREAST BIOPSY    . colonscopy     . JOINT REPLACEMENT  2006   RT TOTAL KNEE  . KNEE ARTHROPLASTY Left 10/28/2017   Procedure: LEFT TOTAL KNEE ARTHROPLASTY WITH COMPUTER NAVIGATION;  Surgeon: Rod Can, MD;  Location: [facility];  Service: Orthopedics;  Laterality: Left;  Needs RNFA  . LUMBAR LAMINECTOMY/DECOMPRESSION MICRODISCECTOMY Left 05/22/2015   Procedure: CENTRAL DECOMPRESSION LUMBAR LAMINECTOMY L3-4 AND L4-5, 2 LEVEL SPINAL STENOSIS WITH MICRODISCECTOMY L3-4 ON LEFT AND FORAMINOTOMY L3-4 AND L4-5 LEFT;  Surgeon: Latanya Maudlin, MD;  Location: [facility];  Service: Orthopedics;  Laterality: Left;  . TOTAL HIP ARTHROPLASTY Left 03/26/2016   Procedure: LEFT TOTAL HIP ARTHROPLASTY ANTERIOR APPROACH;  Surgeon: Rod Can, MD;  Location: [facility];  Service: Orthopedics;  Laterality: Left;  Nedds RNFA  . TOTAL HIP ARTHROPLASTY Right 12/03/2016   Procedure: RIGHT TOTAL HIP ARTHROPLASTY ANTERIOR APPROACH;  Surgeon: Rod Can, MD;  Location: [facility];  Service: Orthopedics;  Laterality: Right;  Needs RNFA  . TOTAL KNEE ARTHROPLASTY Right    Dr. Theda Sers  . TUBAL LIGATION    . UPPER GI ENDOSCOPY      There were no vitals filed for this visit.  Subjective Assessment - 12/21/17 0819    Subjective  Patient stated she is feeling good. Was able to go camping this weekend. Stated she is not having any pain.     Pertinent History  Left TKA 10/28/17, Right THA 12/03/16, Left THA 03/26/16, Central decompression lumbar laminectomy 2017, Right TKA 13 years ago    Patient Stated Goals  Get back to being able to walk without pain    Currently in Pain?  No/denies                       [facility] Adult PT Treatment/Exercise - 12/21/17 0001      Knee/Hip Exercises: Machines for Strengthening   Total Gym Leg Press  Leg press plate 4.  15x double leg    Other Machine  Total gym forward and retro walking 10 feet x 5 repetitions on plate 5      Knee/Hip Exercises: Standing   Forward Lunges  Both;2 sets;15 reps   First set BOSU. 2nd set forward/side with towel slide   Lateral Step Up  Left;15 reps;Hand Hold: 0;Step Height: 8"    Forward Step Up  Left;15 reps;Hand Hold: 0;Step Height: 8"    Step Down  Left;15 reps;Step Height: 8"    Functional Squat  15 reps;Limitations    Functional Squat Limitations  squat then heel raise with yellow ball    Wall Squat  10 reps;5 seconds    Stairs  3RT reciprocal pattern 1 HR    SLS with Vectors  5 second holds forward side and back x 10 Lt with intermittent HHA on foam    Other Standing Knee Exercises  Wall squat x 10 with 5 second holds    Other Standing Knee Exercises  sidestep Green theraband 14  feet x 2RT             PT Education - 12/21/17 0819    Education Details  Discussed purpose and technique of exercises throughout session.     Person(s) Educated  Patient    Methods  Explanation    Comprehension  Verbalized understanding       PT Short Term Goals - 12/13/17 0954      PT SHORT TERM GOAL #1   Title  Patient will demonstrate understanding and report regular compliance with HEP in order to improve knee mobility, lower extremity strength, and overall functional mobility.     Time  3    Period  Weeks    Status  Achieved      PT SHORT TERM GOAL #2   Title  Patient will demonstrate improvement of 1/2 MMT grade in all deficient muscle groups to assist patient with improved mechanics with gait and stair ambulation.     Time  3    Period  Weeks    Status  Achieved      PT SHORT TERM GOAL #3   Title  Patient will demonstrate ability to maintain single limb stance for at least 3 seconds on the left lower extremity in order to assist with improved safety and independence with stair ambulation.     Time  3    Period  Weeks    Status  Achieved      PT SHORT TERM GOAL #4   Title  Patient will demonstrate left knee extension/flexion active range of motion of at least 0-95 degrees to assist with more normalized gait pattern and stair ambulation.    Baseline  0-117    Time  3    Period  Weeks    Status  Achieved        PT Long Term Goals - 12/13/17 0955      PT LONG TERM GOAL #1   Title  Patient will demonstrate left knee extension/flexion active range of motion of at least 0-115 degrees to assist with more normalized gait pattern and stair ambulation.    Time  6    Period  Weeks    Status  Achieved      PT LONG TERM GOAL #2   Title  Patient will demonstrate ability to ambulate at a gait velocity of at least 1.2 m/s on the 3MWT with LRAD to improve patient's safety with ambulation at home and in  the community.     Baseline  ambulating 1.35 m/s without AD     Time  6    Period  Weeks    Status  Achieved      PT LONG TERM GOAL #3   Title  Patient will demonstrate ability to ascend and descend 8 stairs with reciprocal gait pattern indicating improved safety and confidence with stair ambulation for patient's stairs at home.     Time  6    Period  Weeks    Status  Partially Met      PT LONG TERM GOAL #4   Title  Patient will demonstrate at least 4+/5 MMT strength in all tested muscle groups in order to improve patient's ease of completing work activities and help patient return to PLOF.     Time  6    Period  Weeks    Status  On-going            Plan - 12/21/17 0902    Clinical Impression Statement  This session continued to focus on functional strengthening with re-assessment held as patient reported some difficulty with stairs. This session added forward and retro walking on the total gym with patient requiring verbal cues for control and to bend knees. Patient has demonstrated improved control with lunging and with stair ambulation this session. Plan to re-assess next session with anticipated discharge at that time.     Rehab Potential  Good    Clinical Impairments Affecting Rehab Potential  Positive: Social support, positive response from therapy previously. Negative: Multiple lower extremity surgeries    PT Frequency  3x / week    PT Duration  6 weeks    PT Treatment/Interventions  ADLs/Self Care Home Management;Aquatic Therapy;Cryotherapy;Moist Heat;DME Instruction;Gait training;Stair training;Functional mobility training;Therapeutic activities;Therapeutic exercise;Balance training;Neuromuscular re-education;Patient/family education;Manual techniques;Manual lymph drainage;Scar mobilization;Passive range of motion;Dry needling;Energy conservation;Taping    PT Next Visit Plan  Reassess next session, give membership info for [place] exercises appropriate for now.  11/15/17: quad and gastroc stretches; 12/03/17:  Vector stance forward, side, back 5 x 5 second holds each lower extremity 1x/day       Patient will benefit from skilled therapeutic intervention in order to improve the following deficits and impairments:  Abnormal gait, Decreased balance, Decreased endurance, Decreased mobility, Difficulty walking, Hypomobility, Decreased range of motion, Decreased scar mobility, Increased edema, Improper body mechanics, Decreased activity tolerance, Decreased strength, Increased fascial restricitons, Impaired flexibility, Pain  Visit Diagnosis: Acute pain of left knee  Other abnormalities of gait and mobility  Other symptoms and signs involving the musculoskeletal system  Stiffness of left knee, not elsewhere classified  Muscle weakness (generalized)     Problem List Patient Active Problem List   Diagnosis Date Noted  . Osteoarthritis of left knee 10/28/2017  . Primary osteoarthritis of right hip 12/03/2016  . Osteoarthritis of right hip 12/03/2016  . Spinal stenosis, lumbar region, with neurogenic claudication 05/22/2015   Clarene Critchley PT, DPT 9:04 AM, 12/21/17 [place], [place], [address] Phone: [phone]   Fax:  [phone]  Name: Danielle Gray MRN: [id number] Date of Birth: Aug 23, 1950

## 2017-12-23 ENCOUNTER — Encounter (HOSPITAL_COMMUNITY): Payer: Self-pay | Admitting: Physical Therapy

## 2017-12-23 ENCOUNTER — Ambulatory Visit (HOSPITAL_COMMUNITY): Payer: Managed Care, Other (non HMO) | Admitting: Physical Therapy

## 2017-12-23 DIAGNOSIS — M6281 Muscle weakness (generalized): Secondary | ICD-10-CM

## 2017-12-23 DIAGNOSIS — R2689 Other abnormalities of gait and mobility: Secondary | ICD-10-CM

## 2017-12-23 DIAGNOSIS — R29898 Other symptoms and signs involving the musculoskeletal system: Secondary | ICD-10-CM

## 2017-12-23 DIAGNOSIS — M25562 Pain in left knee: Secondary | ICD-10-CM | POA: Diagnosis not present

## 2017-12-23 DIAGNOSIS — M25662 Stiffness of left knee, not elsewhere classified: Secondary | ICD-10-CM

## 2017-12-23 NOTE — Patient Instructions (Signed)
  WALL SQUATS Leaning up against a wall or closed door on your back, slide your body downward and then return back to upright position. A door was used here because it was smoother and had less friction than the wall. Knees should bend in line with the 2nd toe and not pass the front of the foot. Repeat 10 Times Hold 5 Seconds Complete 1 Set Perform 1 Time(s) a Day   FORWARD LUNGE Start by standing with feet shoulder-width-apart. Next, take a step forward and slightly out to the side and allow your front knee to bend. Your back knee may bend as well. Then, return to original position, or you may walk and take a step forward and repeat with the other leg. Keep your pelvis level and straight the entire time. Your front knee should bend in line with the 2nd toe and not pass the front of the foot. Repeat 15 Times Hold 1 Second Complete 2 Sets Perform 3 Time(s) a Week

## 2017-12-23 NOTE — Therapy (Signed)
[facility] [address] [place], [place], [address] Phone: [phone]   Fax:  [phone]  Physical Therapy Treatment/ Progress Note/ Discharge Summary  Patient Details  Name: Danielle Gray MRN: [id number] Date of Birth: 1951-03-13 Referring Provider: Lyla Glassing   Encounter Date: 12/23/2017   Progress Note Reporting Period 11/29/17 to 12/23/17  See note below for Objective Data and Assessment of Progress/Goals.       PT End of Session - 12/23/17 0825    Visit Number  17    Number of Visits  19    Date for PT Re-Evaluation  12/21/17   Minireassess completed 11/29/17   Authorization Type  [employer] managed (based on medical necessity)    Authorization Time Period  11/09/17 - 12/21/17    Authorization - Visit Number  43    Authorization - Number of Visits  60    PT Start Time  0816    PT Stop Time  0850    PT Time Calculation (min)  34 min    Activity Tolerance  Patient tolerated treatment well    Behavior During Therapy  [facility] for tasks assessed/performed       Past Medical History:  Diagnosis Date  . Arthritis   . Back pain   . GERD (gastroesophageal reflux disease)   . 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 Hypertension    changed diet, weight loss, no longer prescribed medication  . Lumbar herniated disc   . Spinal stenosis   . Spondylosis     Past Surgical History:  Procedure Laterality Date  . BLADDER TACK  25 YRS AGO  . BREAST BIOPSY    . colonscopy     . JOINT REPLACEMENT  2006   RT TOTAL KNEE  . KNEE ARTHROPLASTY Left 10/28/2017   Procedure: LEFT TOTAL KNEE ARTHROPLASTY WITH COMPUTER NAVIGATION;  Surgeon: Rod Can, MD;  Location: [facility];  Service: Orthopedics;  Laterality: Left;  Needs RNFA  . LUMBAR LAMINECTOMY/DECOMPRESSION MICRODISCECTOMY Left 05/22/2015   Procedure: CENTRAL DECOMPRESSION LUMBAR LAMINECTOMY L3-4 AND L4-5, 2 LEVEL SPINAL STENOSIS WITH MICRODISCECTOMY L3-4 ON LEFT AND  FORAMINOTOMY L3-4 AND L4-5 LEFT;  Surgeon: Latanya Maudlin, MD;  Location: [facility];  Service: Orthopedics;  Laterality: Left;  . TOTAL HIP ARTHROPLASTY Left 03/26/2016   Procedure: LEFT TOTAL HIP ARTHROPLASTY ANTERIOR APPROACH;  Surgeon: Rod Can, MD;  Location: [facility];  Service: Orthopedics;  Laterality: Left;  [place] RNFA  . TOTAL HIP ARTHROPLASTY Right 12/03/2016   Procedure: RIGHT TOTAL HIP ARTHROPLASTY ANTERIOR APPROACH;  Surgeon: Rod Can, MD;  Location: [facility];  Service: Orthopedics;  Laterality: Right;  Needs RNFA  . TOTAL KNEE ARTHROPLASTY Right    Dr. Theda Sers  . TUBAL LIGATION    . UPPER GI ENDOSCOPY      There were no vitals filed for this visit.  Subjective Assessment - 12/23/17 0823    Subjective  Patient stated that she is not having any pain. She stated she is feeling like the last 2 weeks she has improved a lot and is ready for discharge.     Pertinent History  Left TKA 10/28/17, Right THA 12/03/16, Left THA 03/26/16, Central decompression lumbar laminectomy 2017, Right TKA 13 years ago    How long can you sit comfortably?  Not limited    How long can you stand comfortably?  1 hour    How long can you walk comfortably?  1 hour  or more    Patient Stated Goals  Get back to being able to walk without pain    Currently in Pain?  No/denies         [facility] PT Assessment - 12/23/17 0001      Assessment   Medical Diagnosis  Presence of Left artificial knee joint    Referring Provider  [place]    Onset Date/Surgical Date  12/13/17    Next MD Visit  6 months      [place] residence    Living Arrangements  Spouse/significant other    Type of [place] to enter    Entrance Stairs-Number of Steps  [place]  One level    [place] - 2 wheels;Cane - single point      Prior Function   Level of Independence  Independent;Independent with basic ADLs       Cognition   Overall Cognitive Status  Within Functional Limits for tasks assessed      Observation/Other Assessments   Focus on Therapeutic Outcomes (FOTO)   72% (28% limited)      Observation/Other Assessments-Edema    Edema  Circumferential      Circumferential Edema   Circumferential - Right  46.5 cm    Circumferential - Left   47   was 51.5 then 47 cm     AROM   Left Knee Extension  0    Left Knee Flexion  116      Strength   Right Hip Flexion  5/5    Right Hip Extension  5/5    Right Hip ABduction  5/5   was 4+/5   Left Hip Flexion  5/5   was 4+/5   Left Hip Extension  5/5   was 4+/5   Left Hip ABduction  5/5   was 4+/5   Right Knee Flexion  5/5    Right Knee Extension  5/5    Left Knee Flexion  5/5    Left Knee Extension  5/5   4+/5   Right Ankle Dorsiflexion  5/5    Left Ankle Dorsiflexion  5/5      Ambulation/Gait   Ambulation/Gait  Yes    Ambulation Distance (Feet)  938 Feet    Assistive device  None    Gait Pattern  Within Functional Limits      Static Standing Balance   Static Standing - Balance Support  No upper extremity supported    Static Standing Balance -  Activities   Single Leg Stance - Right Leg;Single Leg Stance - Left Leg    Static Standing - Comment/# of Minutes  Left: 9.34, Right: 10.54 seconds                   [facility] Adult PT Treatment/Exercise - 12/23/17 0001      Ambulation/Gait   Ambulation Surface  Level;Indoor    Gait velocity  1.6 m/s     Stairs  Yes    Stair Management Technique  One rail Right;Other (comment);Alternating pattern    Number of Stairs  8    Height of Stairs  7      Knee/Hip Exercises: Aerobic   Stationary Bike  4 minutes on stationary bike to warm up at beginning of session (not billed) no resistance seat 10  PT Education - 12/23/17 0901    Education Details  Discussed re-examination findings and discharge plans, updated HEP, and about attending the local gym.     Person(s)  Educated  Patient    Methods  Explanation;Handout    Comprehension  Verbalized understanding       PT Short Term Goals - 12/23/17 0856      PT SHORT TERM GOAL #1   Title  Patient will demonstrate understanding and report regular compliance with HEP in order to improve knee mobility, lower extremity strength, and overall functional mobility.     Baseline  12/23/17: Patient reported understanding and compliance with HEP    Time  3    Period  Weeks    Status  Achieved      PT SHORT TERM GOAL #2   Title  Patient will demonstrate improvement of 1/2 MMT grade in all deficient muscle groups to assist patient with improved mechanics with gait and stair ambulation.     Baseline  12/23/17: See MMT    Time  3    Period  Weeks    Status  Achieved      PT SHORT TERM GOAL #3   Title  Patient will demonstrate ability to maintain single limb stance for at least 3 seconds on the left lower extremity in order to assist with improved safety and independence with stair ambulation.     Baseline  12/23/17: See objective measures    Time  3    Period  Weeks    Status  Achieved      PT SHORT TERM GOAL #4   Title  Patient will demonstrate left knee extension/flexion active range of motion of at least 0-95 degrees to assist with more normalized gait pattern and stair ambulation.    Baseline  12/23/17: 0-116 degrees    Time  3    Period  Weeks    Status  Achieved        PT Long Term Goals - 12/23/17 0857      PT LONG TERM GOAL #1   Title  Patient will demonstrate left knee extension/flexion active range of motion of at least 0-115 degrees to assist with more normalized gait pattern and stair ambulation.    Baseline  12/23/17: 0-116    Time  6    Period  Weeks    Status  Achieved      PT LONG TERM GOAL #2   Title  Patient will demonstrate ability to ambulate at a gait velocity of at least 1.2 m/s on the 3MWT with LRAD to improve patient's safety with ambulation at home and in the community.     Baseline   12/23/17: ambulating 1.6 m/s without AD    Time  6    Period  Weeks    Status  Achieved      PT LONG TERM GOAL #3   Title  Patient will demonstrate ability to ascend and descend 8 stairs with reciprocal gait pattern indicating improved safety and confidence with stair ambulation for patient's stairs at home.     Baseline  12/23/17: Patient performed with reciprocal pattern and stated improved confidence with stairs.     Time  6    Period  Weeks    Status  Achieved      PT LONG TERM GOAL #4   Title  Patient will demonstrate at least 4+/5 MMT strength in all tested muscle groups in order to improve patient's ease of completing  work activities and help patient return to [place].     Baseline  12/23/17: See MMT.    Time  6    Period  Weeks    Status  Achieved            Plan - 12/23/17 0901    Clinical Impression Statement  This session performed a re-assessment of patient's progress towards goals. She achieved all short term and all long term goals. She demonstrated improvement in gait velocity, ROM, strength, and functional mobility since her last re-assessment. Patient also reported feeling ready for discharge. The remainder of the session discussed with the patient continuing her exercises, educated patient about updated HEP. Explained to patient that she should contact the clinic with any future questions or concerns. Patient is being discharged at this time as she has made good progress and has met all of her rehabilitation goals.     Rehab Potential  Good    Clinical Impairments Affecting Rehab Potential  Positive: Social support, positive response from therapy previously. Negative: Multiple lower extremity surgeries    PT Frequency  3x / week    PT Duration  6 weeks    PT Treatment/Interventions  ADLs/Self Care Home Management;Aquatic Therapy;Cryotherapy;Moist Heat;DME Instruction;Gait training;Stair training;Functional mobility training;Therapeutic activities;Therapeutic exercise;Balance  training;Neuromuscular re-education;Patient/family education;Manual techniques;Manual lymph drainage;Scar mobilization;Passive range of motion;Dry needling;Energy conservation;Taping    PT Next Visit Plan  Discharged    PT Home Exercise Plan  HHPT exercises appropriate for now.  11/15/17: quad and gastroc stretches; 12/03/17: Vector stance forward, side, back 5 x 5 second holds each lower extremity 1x/day; 12/23/17: wall squat x10 5 second holds 1x/day, Forward lunge 2x15 3x/week        PHYSICAL THERAPY DISCHARGE SUMMARY  Visits from Start of Care: 17  Current functional level related to goals / functional outcomes: See above   Remaining deficits: See above   Education / Equipment: Upadated HEP, continuing exercise at a local gym  Plan: Patient agrees to discharge.  Patient goals were met. Patient is being discharged due to meeting the stated rehab goals.  ?????          Patient will benefit from skilled therapeutic intervention in order to improve the following deficits and impairments:  Abnormal gait, Decreased balance, Decreased endurance, Decreased mobility, Difficulty walking, Hypomobility, Decreased range of motion, Decreased scar mobility, Increased edema, Improper body mechanics, Decreased activity tolerance, Decreased strength, Increased fascial restricitons, Impaired flexibility, Pain  Visit Diagnosis: Acute pain of left knee  Other abnormalities of gait and mobility  Other symptoms and signs involving the musculoskeletal system  Stiffness of left knee, not elsewhere classified  Muscle weakness (generalized)     Problem List Patient Active Problem List   Diagnosis Date Noted  . Osteoarthritis of left knee 10/28/2017  . Primary osteoarthritis of right hip 12/03/2016  . Osteoarthritis of right hip 12/03/2016  . Spinal stenosis, lumbar region, with neurogenic claudication 05/22/2015   Clarene Critchley PT, DPT 9:11 AM, 12/23/17 [place], [place], [address] Phone: [phone]   Fax:  [phone]  Name: Danielle Gray MRN: [id number] Date of Birth: [birth year]

## 2018-01-31 ENCOUNTER — Other Ambulatory Visit (HOSPITAL_COMMUNITY): Payer: Self-pay | Admitting: Unknown Physician Specialty

## 2018-01-31 DIAGNOSIS — Z1231 Encounter for screening mammogram for malignant neoplasm of breast: Secondary | ICD-10-CM

## 2018-02-16 ENCOUNTER — Ambulatory Visit (HOSPITAL_COMMUNITY)
Admission: RE | Admit: 2018-02-16 | Discharge: 2018-02-16 | Disposition: A | Discharge status: Home / Self Care | Payer: Managed Care, Other (non HMO) | Source: Ambulatory Visit | Attending: Unknown Physician Specialty | Admitting: Unknown Physician Specialty

## 2018-02-16 DIAGNOSIS — Z1231 Encounter for screening mammogram for malignant neoplasm of breast: Secondary | ICD-10-CM | POA: Insufficient documentation

## 2019-09-26 ENCOUNTER — Encounter: Payer: Self-pay | Admitting: *Deleted

## 2019-11-22 ENCOUNTER — Encounter (INDEPENDENT_AMBULATORY_CARE_PROVIDER_SITE_OTHER): Payer: Self-pay | Admitting: *Deleted

## 2019-12-05 ENCOUNTER — Ambulatory Visit: Payer: Managed Care, Other (non HMO)

## 2019-12-18 ENCOUNTER — Other Ambulatory Visit (INDEPENDENT_AMBULATORY_CARE_PROVIDER_SITE_OTHER): Payer: Self-pay | Admitting: *Deleted

## 2020-01-03 ENCOUNTER — Other Ambulatory Visit (INDEPENDENT_AMBULATORY_CARE_PROVIDER_SITE_OTHER): Payer: Self-pay | Admitting: *Deleted

## 2020-01-03 DIAGNOSIS — Z800 Family history of malignant neoplasm of digestive organs: Secondary | ICD-10-CM

## 2020-01-03 DIAGNOSIS — Z1211 Encounter for screening for malignant neoplasm of colon: Secondary | ICD-10-CM

## 2020-01-10 ENCOUNTER — Ambulatory Visit: Payer: Self-pay

## 2020-02-05 ENCOUNTER — Telehealth (INDEPENDENT_AMBULATORY_CARE_PROVIDER_SITE_OTHER): Payer: Self-pay | Admitting: *Deleted

## 2020-02-05 ENCOUNTER — Encounter (INDEPENDENT_AMBULATORY_CARE_PROVIDER_SITE_OTHER): Payer: Self-pay | Admitting: *Deleted

## 2020-02-05 MED ORDER — SUTAB 1479-225-188 MG PO TABS: 1.0000 | ORAL_TABLET | Freq: Once | ORAL | 0 refills | Status: AC

## 2020-02-05 NOTE — Telephone Encounter (Signed)
Patient needs [employer] (copay card) ° °

## 2020-02-05 NOTE — Telephone Encounter (Signed)
Referring MD/PCP: daniel   Procedure: tcs  Reason/Indication:  Screening, fam hx colon ca  Has patient had this procedure before?  Yes, 10-12 yrs ago  If so, when, by whom and where?    Is there a family history of colon cancer?  Yes, mother & grandmother  Who?  What age when diagnosed?    Is patient diabetic?   no      Does patient have prosthetic heart valve or mechanical valve?  no  Do you have a pacemaker/defibrillator?  no  Has patient ever had endocarditis/atrial fibrillation? no  Does patient use oxygen? no  Has patient had joint replacement within last 12 months?  no  Is patient constipated or do they take laxatives? no  Does patient have a history of alcohol/drug use?  no  Is patient on blood thinner such as Coumadin, Plavix and/or Aspirin? no  Medications: lisinopril 10 mg 1/2 tab daily  Allergies: nkda  Medication Adjustment per Dr Rehman/Dr Jenetta Downer   Procedure date & time: 03/07/20

## 2020-03-05 ENCOUNTER — Other Ambulatory Visit (HOSPITAL_COMMUNITY)
Admission: RE | Admit: 2020-03-05 | Discharge: 2020-03-05 | Disposition: A | Discharge status: Home / Self Care | Payer: Managed Care, Other (non HMO) | Source: Ambulatory Visit | Attending: Internal Medicine | Admitting: Internal Medicine

## 2020-03-05 ENCOUNTER — Other Ambulatory Visit: Payer: Self-pay

## 2020-03-05 DIAGNOSIS — Z20822 Contact with and (suspected) exposure to covid-19: Secondary | ICD-10-CM | POA: Insufficient documentation

## 2020-03-05 DIAGNOSIS — Z01812 Encounter for preprocedural laboratory examination: Secondary | ICD-10-CM | POA: Insufficient documentation

## 2020-03-06 LAB — SARS CORONAVIRUS 2 (TAT 6-24 HRS): SARS Coronavirus 2: NEGATIVE

## 2020-03-07 ENCOUNTER — Ambulatory Visit (HOSPITAL_COMMUNITY)
Admission: RE | Admit: 2020-03-07 | Discharge: 2020-03-07 | Disposition: A | Discharge status: Home / Self Care | Payer: Managed Care, Other (non HMO) | Attending: Internal Medicine | Admitting: Internal Medicine

## 2020-03-07 ENCOUNTER — Other Ambulatory Visit: Payer: Self-pay

## 2020-03-07 ENCOUNTER — Encounter (HOSPITAL_COMMUNITY)
Admission: RE | Disposition: A | Discharge status: Home / Self Care | Payer: Self-pay | Source: Home / Self Care | Attending: Internal Medicine

## 2020-03-07 ENCOUNTER — Encounter (HOSPITAL_COMMUNITY): Payer: Self-pay | Admitting: Internal Medicine

## 2020-03-07 DIAGNOSIS — D123 Benign neoplasm of transverse colon: Secondary | ICD-10-CM | POA: Diagnosis not present

## 2020-03-07 DIAGNOSIS — Z800 Family history of malignant neoplasm of digestive organs: Secondary | ICD-10-CM | POA: Insufficient documentation

## 2020-03-07 DIAGNOSIS — Z79899 Other long term (current) drug therapy: Secondary | ICD-10-CM | POA: Insufficient documentation

## 2020-03-07 DIAGNOSIS — K644 Residual hemorrhoidal skin tags: Secondary | ICD-10-CM | POA: Insufficient documentation

## 2020-03-07 DIAGNOSIS — D122 Benign neoplasm of ascending colon: Secondary | ICD-10-CM | POA: Insufficient documentation

## 2020-03-07 DIAGNOSIS — Z885 Allergy status to narcotic agent status: Secondary | ICD-10-CM | POA: Diagnosis not present

## 2020-03-07 DIAGNOSIS — K5730 Diverticulosis of large intestine without perforation or abscess without bleeding: Secondary | ICD-10-CM | POA: Insufficient documentation

## 2020-03-07 DIAGNOSIS — D124 Benign neoplasm of descending colon: Secondary | ICD-10-CM | POA: Diagnosis not present

## 2020-03-07 DIAGNOSIS — Z1211 Encounter for screening for malignant neoplasm of colon: Secondary | ICD-10-CM

## 2020-03-07 HISTORY — PX: POLYPECTOMY: SHX5525

## 2020-03-07 HISTORY — PX: COLONOSCOPY: SHX5424

## 2020-03-07 LAB — HM COLONOSCOPY

## 2020-03-07 SURGERY — COLONOSCOPY
Anesthesia: Moderate Sedation

## 2020-03-07 MED ORDER — STERILE WATER FOR IRRIGATION IR SOLN
Status: DC | PRN
  Administered 2020-03-07: 1.5 mL

## 2020-03-07 MED ORDER — MEPERIDINE HCL 50 MG/ML IJ SOLN
INTRAMUSCULAR | Status: DC | PRN
  Administered 2020-03-07 (×2): 25 mg via INTRAVENOUS

## 2020-03-07 MED ORDER — MIDAZOLAM HCL 5 MG/5ML IJ SOLN
INTRAMUSCULAR | Status: DC | PRN
  Administered 2020-03-07 (×3): 2 mg via INTRAVENOUS
  Administered 2020-03-07: 1 mg via INTRAVENOUS

## 2020-03-07 MED ORDER — MEPERIDINE HCL 50 MG/ML IJ SOLN
INTRAMUSCULAR | Status: AC
  Filled 2020-03-07: qty 1

## 2020-03-07 MED ORDER — SODIUM CHLORIDE 0.9 % IV SOLN: INTRAVENOUS | Status: DC

## 2020-03-07 MED ORDER — MIDAZOLAM HCL 5 MG/5ML IJ SOLN
INTRAMUSCULAR | Status: AC
  Filled 2020-03-07: qty 10

## 2020-03-07 NOTE — Discharge Instructions (Signed)
Colon Polyps  Polyps are tissue growths inside the body. Polyps can grow in many places, including the large intestine (colon). A polyp may be a round bump or a mushroom-shaped growth. You could have one polyp or several. Most colon polyps are noncancerous (benign). However, some colon polyps can become cancerous over time. Finding and removing the polyps early can help prevent this. What are the causes? The exact cause of colon polyps is not known. What increases the risk? You are more likely to develop this condition if you:  Have a family history of colon cancer or colon polyps.  Are older than 80 or older than 45 if you are African American.  Have inflammatory bowel disease, such as ulcerative colitis or Crohn's disease.  Have certain hereditary conditions, such as: ? Familial adenomatous polyposis. ? Lynch syndrome. ? Turcot syndrome. ? Peutz-Jeghers syndrome.  Are overweight.  Smoke cigarettes.  Do not get enough exercise.  Drink too much alcohol.  Eat a diet that is high in fat and red meat and low in fiber.  Had childhood cancer that was treated with abdominal radiation. What are the signs or symptoms? Most polyps do not cause symptoms. If you have symptoms, they may include:  Blood coming from your rectum when having a bowel movement.  Blood in your stool. The stool may look dark red or black.  Abdominal pain.  A change in bowel habits, such as constipation or diarrhea. How is this diagnosed? This condition is diagnosed with a colonoscopy. This is a procedure in which a lighted, flexible scope is inserted into the anus and then passed into the colon to examine the area. Polyps are sometimes found when a colonoscopy is done as part of routine cancer screening tests. How is this treated? Treatment for this condition involves removing any polyps that are found. Most polyps can be removed during a colonoscopy. Those polyps will then be tested for cancer. Additional  treatment may be needed depending on the results of testing. Follow these instructions at home: Lifestyle  Maintain a healthy weight, or lose weight if recommended by your health care provider.  Exercise every day or as told by your health care provider.  Do not use any products that contain nicotine or tobacco, such as cigarettes and e-cigarettes. If you need help quitting, ask your health care provider.  If you drink alcohol, limit how much you have: ? 0-1 drink a day for women. ? 0-2 drinks a day for men.  Be aware of how much alcohol is in your drink. In the [place] one drink equals one 12 oz bottle of beer (355 mL), one 5 oz glass of wine (148 mL), or one 1 oz shot of hard liquor (44 mL). Eating and drinking   Eat foods that are high in fiber, such as fruits, vegetables, and whole grains.  Eat foods that are high in calcium and vitamin D, such as milk, cheese, yogurt, eggs, liver, fish, and broccoli.  Limit foods that are high in fat, such as fried foods and desserts.  Limit the amount of red meat and processed meat you eat, such as hot dogs, sausage, bacon, and lunch meats. General instructions  Keep all follow-up visits as told by your health care provider. This is important. ? This includes having regularly scheduled colonoscopies. ? Talk to your health care provider about when you need a colonoscopy. Contact a health care provider if:  You have new or worsening bleeding during a bowel movement.  You  have new or increased blood in your stool.  You have a change in bowel habits.  You lose weight for no known reason. Summary  Polyps are tissue growths inside the body. Polyps can grow in many places, including the colon.  Most colon polyps are noncancerous (benign), but some can become cancerous over time.  This condition is diagnosed with a colonoscopy.  Treatment for this condition involves removing any polyps that are found. Most polyps can be removed during a  colonoscopy. This information is not intended to replace advice given to you by your health care provider. Make sure you discuss any questions you have with your health care provider. Document Revised: 07/22/2017 Document Reviewed: 07/22/2017 Elsevier Patient Education  [place]. Colonoscopy, Adult, Care After This sheet gives you information about how to care for yourself after your procedure. Your health care provider may also give you more specific instructions. If you have problems or questions, contact your health care provider. What can I expect after the procedure? After the procedure, it is common to have:  A small amount of blood in your stool for 24 hours after the procedure.  Some gas.  Mild cramping or bloating of your abdomen. Follow these instructions at home: Eating and drinking   Drink enough fluid to keep your urine pale yellow.  Follow instructions from your health care provider about eating or drinking restrictions.  Resume your normal diet as instructed by your health care provider. Avoid heavy or fried foods that are hard to digest. Activity  Rest as told by your health care provider.  Avoid sitting for a long time without moving. Get up to take short walks every 1-2 hours. This is important to improve blood flow and breathing. Ask for help if you feel weak or unsteady.  Return to your normal activities as told by your health care provider. Ask your health care provider what activities are safe for you. Managing cramping and bloating   Try walking around when you have cramps or feel bloated.  Apply heat to your abdomen as told by your health care provider. Use the heat source that your health care provider recommends, such as a moist heat pack or a heating pad. ? Place a towel between your skin and the heat source. ? Leave the heat on for 20-30 minutes. ? Remove the heat if your skin turns bright red. This is especially important if you are unable  to feel pain, heat, or cold. You may have a greater risk of getting burned. General instructions  For the first 24 hours after the procedure: ? Do not drive or use machinery. ? Do not sign important documents. ? Do not drink alcohol. ? Do your regular daily activities at a slower pace than normal. ? Eat soft foods that are easy to digest.  Take over-the-counter and prescription medicines only as told by your health care provider.  Keep all follow-up visits as told by your health care provider. This is important. Contact a health care provider if:  You have blood in your stool 2-3 days after the procedure. Get help right away if you have:  More than a small spotting of blood in your stool.  Large blood clots in your stool.  Swelling of your abdomen.  Nausea or vomiting.  A fever.  Increasing pain in your abdomen that is not relieved with medicine. Summary  After the procedure, it is common to have a small amount of blood in your stool. You may  also have mild cramping and bloating of your abdomen.  For the first 24 hours after the procedure, do not drive or use machinery, sign important documents, or drink alcohol.  Get help right away if you have a lot of blood in your stool, nausea or vomiting, a fever, or increased pain in your abdomen. This information is not intended to replace advice given to you by your health care provider. Make sure you discuss any questions you have with your health care provider. Document Revised: 10/31/2018 Document Reviewed: 10/31/2018 Elsevier Patient Education  [place]. No aspirin or NSAIDs for 24 hours. Resume usual medications as before High-fiber diet. No driving for 24 hours. Physician will call with biopsy results.

## 2020-03-07 NOTE — Op Note (Signed)
[facility] Patient Name: Danielle Gray Procedure Date: 03/07/2020 8:20 AM MRN: [id number] Date of Birth: April 26, 1950 Attending MD: Hildred Laser , MD CSN: [id number] Age: 69 Admit Type: Outpatient Procedure:                Colonoscopy Indications:              Screening patient at increased risk: Family history                            of 1st-degree relative with colorectal cancer at                            age 77 years (or older) Providers:                Hildred Laser, MD, Otis Peak B. Sharon Seller, RN, Lambert Mody, Randa Spike, Technician Referring MD:             Mitzie Na. Quillian Quince, MD Medicines:                Meperidine 50 mg IV, Midazolam 7 mg IV Complications:            No immediate complications. Estimated Blood Loss:     Estimated blood loss was minimal. Procedure:                Pre-Anesthesia Assessment:                           - Prior to the procedure, a History and Physical                            was performed, and patient medications and                            allergies were reviewed. The patient's tolerance of                            previous anesthesia was also reviewed. The risks                            and benefits of the procedure and the sedation                            options and risks were discussed with the patient.                            All questions were answered, and informed consent                            was obtained. Prior Anticoagulants: The patient has                            taken no previous anticoagulant or antiplatelet  agents. ASA Grade Assessment: II - A patient with                            mild systemic disease. After reviewing the risks                            and benefits, the patient was deemed in                            satisfactory condition to undergo the procedure.                           After obtaining informed consent, the  colonoscope                            was passed under direct vision. Throughout the                            procedure, the patient's blood pressure, pulse, and                            oxygen saturations were monitored continuously. The                            [id number]-[id number] ([id number]) scope was introduced through                            the anus and advanced to the the cecum, identified                            by appendiceal orifice and ileocecal valve. The                            colonoscopy was performed without difficulty. The                            patient tolerated the procedure well. The quality                            of the bowel preparation was adequate. The                            ileocecal valve, appendiceal orifice, and rectum                            were photographed. Scope In: 8:40:31 AM Scope Out: 9:10:51 AM Scope Withdrawal Time: 0 hours 27 minutes 3 seconds  Total Procedure Duration: 0 hours 30 minutes 20 seconds  Findings:      The perianal and digital rectal examinations were normal.      Five sessile polyps were found in the hepatic flexure and ascending       colon. The polyps were small in size. These polyps were removed with a       cold snare. Resection and retrieval were complete.  The pathology       specimen was placed into Bottle Number 1.      Two polyps were found in the hepatic flexure and ascending colon. The       polyps were diminutive in size. These were biopsied with a cold forceps       for histology. The pathology specimen was placed into Bottle Number 1.      Three sessile polyps were found in the descending colon, splenic flexure       and transverse colon. The polyps were small in size. These polyps were       removed with a cold snare. Resection and retrieval were complete. The       pathology specimen was placed into Bottle Number 2.      A diminutive polyp was found in the descending colon. Biopsies were       taken  with a cold forceps for histology. The pathology specimen was       placed into Bottle Number 2.      A few diverticula were found in the sigmoid colon.      External hemorrhoids were found during retroflexion. The hemorrhoids       were small. Impression:               - Five small polyps at the hepatic flexure and in                            the ascending colon, removed with a cold snare.                            Resected and retrieved.                           - Two diminutive polyps at the hepatic flexure and                            in the ascending colon. Biopsied.                           - Three small polyps in the descending colon, at                            the splenic flexure and in the transverse colon,                            removed with a cold snare. Resected and retrieved.                           - One diminutive polyp in the descending colon.                            Biopsied.                           - Diverticulosis in the sigmoid colon.                           - External hemorrhoids.  Comment: Patient had 11 polyps altogether. Moderate Sedation:      Moderate (conscious) sedation was administered by the endoscopy nurse       and supervised by the endoscopist. The following parameters were       monitored: oxygen saturation, heart rate, blood pressure, CO2       capnography and response to care. Total physician intraservice time was       38 minutes. Recommendation:           - Patient has a contact number available for                            emergencies. The signs and symptoms of potential                            delayed complications were discussed with the                            patient. Return to normal activities tomorrow.                            Written discharge instructions were provided to the                            patient.                           - High fiber diet today.                            - Continue present medications.                           - No aspirin, ibuprofen, naproxen, or other                            non-steroidal anti-inflammatory drugs for 1 day.                           - Await pathology results.                           - Repeat colonoscopy is recommended. The                            colonoscopy date will be determined after pathology                            results from today's exam become available for                            review. Procedure Code(s):        --- Professional ---                           [phone], Colonoscopy, flexible; with removal of  tumor(s), polyp(s), or other lesion(s) by snare                            technique                           [id number], 59, Colonoscopy, flexible; with biopsy,                            single or multiple                           99153, Moderate sedation; each additional 15                            minutes intraservice time                           99153, Moderate sedation; each additional 15                            minutes intraservice time                           G0500, Moderate sedation services provided by the                            same physician or other qualified health care                            professional performing a gastrointestinal                            endoscopic service that sedation supports,                            requiring the presence of an independent trained                            observer to assist in the monitoring of the                            patient's level of consciousness and physiological                            status; initial 15 minutes of intra-service time;                            patient age 25 years or older (additional time may                            be reported with [phone], as appropriate) Diagnosis Code(s):        --- Professional ---                           K63.5, Polyp of colon  Z80.0, Family history of malignant neoplasm of                            digestive organs                           K64.4, Residual hemorrhoidal skin tags                           K57.30, Diverticulosis of large intestine without                            perforation or abscess without bleeding CPT copyright 2019 American Medical Association. All rights reserved. The codes documented in this report are preliminary and upon coder review may  be revised to meet current compliance requirements. Hildred Laser, MD Hildred Laser, MD 03/07/2020 9:25:04 AM This report has been signed electronically. Number of Addenda: 0

## 2020-03-07 NOTE — H&P (Signed)
Danielle Gray is an 69 y.o. female.   Chief Complaint: Patient is here for colonoscopy. HPI: Patient is 69 year old Caucasian female who is here for screening colonoscopy last exam was 10 or 12 years ago and was normal.  She denies abdominal pain change in bowel habits or rectal bleeding. History significant for colon carcinoma and her mother who who was 79 at the time of diagnosis.  Maternal grandmother also had colon carcinoma and she was possibly in her late 33s. Patient does not take aspirin or anticoagulants.  Past Medical History:  Diagnosis Date  . Arthritis   . Back pain   . GERD (gastroesophageal reflux disease)   . 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 Hypertension    changed diet, weight loss, no longer prescribed medication  . Lumbar herniated disc   . Spinal stenosis   . Spondylosis     Past Surgical History:  Procedure Laterality Date  . BLADDER TACK  25 YRS AGO  . BREAST BIOPSY    . colonscopy     . JOINT REPLACEMENT  2006   RT TOTAL KNEE  . KNEE ARTHROPLASTY Left 10/28/2017   Procedure: LEFT TOTAL KNEE ARTHROPLASTY WITH COMPUTER NAVIGATION;  Surgeon: Rod Can, MD;  Location: [facility];  Service: Orthopedics;  Laterality: Left;  Needs RNFA  . LUMBAR LAMINECTOMY/DECOMPRESSION MICRODISCECTOMY Left 05/22/2015   Procedure: CENTRAL DECOMPRESSION LUMBAR LAMINECTOMY L3-4 AND L4-5, 2 LEVEL SPINAL STENOSIS WITH MICRODISCECTOMY L3-4 ON LEFT AND FORAMINOTOMY L3-4 AND L4-5 LEFT;  Surgeon: Latanya Maudlin, MD;  Location: [facility];  Service: Orthopedics;  Laterality: Left;  . TOTAL HIP ARTHROPLASTY Left 03/26/2016   Procedure: LEFT TOTAL HIP ARTHROPLASTY ANTERIOR APPROACH;  Surgeon: Rod Can, MD;  Location: [facility];  Service: Orthopedics;  Laterality: Left;  Nedds RNFA  . TOTAL HIP ARTHROPLASTY Right 12/03/2016   Procedure: RIGHT TOTAL HIP ARTHROPLASTY ANTERIOR APPROACH;  Surgeon: Rod Can, MD;  Location: [facility];  Service: Orthopedics;  Laterality:  Right;  Needs RNFA  . TOTAL KNEE ARTHROPLASTY Right    Dr. Theda Sers  . TUBAL LIGATION    . UPPER GI ENDOSCOPY      History reviewed. No pertinent family history. Social History:  reports that she has never smoked. She has never used smokeless tobacco. She reports current alcohol use. She reports that she does not use drugs.  Allergies:  Allergies  Allergen Reactions  . Hydrocodone Rash    Medications Prior to Admission  Medication Sig Dispense Refill  . acetaminophen (TYLENOL) 500 MG tablet Take 1,000 mg by mouth every 6 (six) hours as needed for moderate pain or headache.    . Ascorbic Acid (VITAMIN C) 1000 MG tablet Take 1,000 mg by mouth daily.    . Cholecalciferol (DIALYVITE VITAMIN D 5000) 125 MCG (5000 UT) capsule Take 5,000 Units by mouth daily.    . Cyanocobalamin (B-12 PO) Take 1 tablet by mouth daily.    Marland Kitchen lisinopril (ZESTRIL) 10 MG tablet Take 5 mg by mouth daily.    . Turmeric (QC TUMERIC COMPLEX PO) Take by mouth.    Marland Kitchen apixaban (ELIQUIS) 2.5 MG TABS tablet Take 1 tablet (2.5 mg total) by mouth every 12 (twelve) hours. (Patient not taking: Reported on 03/04/2020) 60 tablet 0    Results for orders placed or performed during the hospital encounter of 03/05/20 (from the past 48 hour(s))  SARS CORONAVIRUS 2 (TAT 6-24 HRS) Nasopharyngeal Nasopharyngeal Swab     Status: None  Collection Time: 03/05/20  2:00 PM   Specimen: Nasopharyngeal Swab  Result Value Ref Range   SARS Coronavirus 2 NEGATIVE NEGATIVE    Comment: (NOTE) SARS-CoV-2 target nucleic acids are NOT DETECTED.  The SARS-CoV-2 RNA is generally detectable in upper and lower respiratory specimens during the acute phase of infection. Negative results do not preclude SARS-CoV-2 infection, do not rule out co-infections with other pathogens, and should not be used as the sole basis for treatment or other patient management decisions. Negative results must be combined with clinical observations, patient history, and  epidemiological information. The expected result is Negative.  Fact Sheet for Patients: SugarRoll.be  Fact Sheet for Healthcare Providers: https://www.woods-mathews.com/  This test is not yet approved or cleared by the [place] FDA and  has been authorized for detection and/or diagnosis of SARS-CoV-2 by FDA under an Emergency Use Authorization (EUA). This EUA will remain  in effect (meaning this test can be used) for the duration of the COVID-19 declaration under Se ction 564(b)(1) of the Act, 21 U.S.C. section 360bbb-3(b)(1), unless the authorization is terminated or revoked sooner.  Performed at [facility], [place] [address], [place], [place] [address]    No results found.  Review of Systems  Blood pressure 129/74, pulse 66, temperature 97.9 F (36.6 C), temperature source Oral, resp. rate 18, height 5\' 6"  (1.676 m), weight 102.1 kg, SpO2 100 %. Physical Exam HENT:     Mouth/Throat:     Mouth: Mucous membranes are moist.     Pharynx: Oropharynx is clear.  Eyes:     General: No scleral icterus.    Conjunctiva/sclera: Conjunctivae normal.  Cardiovascular:     Rate and Rhythm: Normal rate and regular rhythm.     Heart sounds: Normal heart sounds. No murmur heard.   Pulmonary:     Effort: Pulmonary effort is normal.     Breath sounds: Normal breath sounds.  Abdominal:     General: There is no distension.     Palpations: Abdomen is soft. There is no mass.     Tenderness: There is no abdominal tenderness.  Musculoskeletal:        General: No swelling.     Cervical back: Neck supple.  Lymphadenopathy:     Cervical: No cervical adenopathy.  Skin:    General: Skin is warm and dry.  Neurological:     Mental Status: She is alert.      Assessment/Plan Screening for colorectal carcinoma. Family history of CRC first and second-degree relative at late onset.  Hildred Laser, MD 03/07/2020, 8:28 AM

## 2020-03-08 LAB — SURGICAL PATHOLOGY

## 2020-03-12 ENCOUNTER — Encounter (HOSPITAL_COMMUNITY): Payer: Self-pay | Admitting: Internal Medicine

## 2020-04-04 ENCOUNTER — Encounter (INDEPENDENT_AMBULATORY_CARE_PROVIDER_SITE_OTHER): Payer: Self-pay | Admitting: *Deleted

## 2021-02-19 ENCOUNTER — Other Ambulatory Visit (HOSPITAL_COMMUNITY): Payer: Self-pay | Admitting: Family Medicine

## 2021-02-19 DIAGNOSIS — Z1231 Encounter for screening mammogram for malignant neoplasm of breast: Secondary | ICD-10-CM

## 2021-02-27 ENCOUNTER — Ambulatory Visit (HOSPITAL_COMMUNITY): Payer: Managed Care, Other (non HMO)

## 2021-03-05 ENCOUNTER — Ambulatory Visit (HOSPITAL_COMMUNITY)
Admission: RE | Admit: 2021-03-05 | Discharge: 2021-03-05 | Disposition: A | Discharge status: Home / Self Care | Payer: Managed Care, Other (non HMO) | Source: Ambulatory Visit | Attending: Family Medicine | Admitting: Family Medicine

## 2021-03-05 ENCOUNTER — Other Ambulatory Visit: Payer: Self-pay

## 2021-03-05 DIAGNOSIS — Z1231 Encounter for screening mammogram for malignant neoplasm of breast: Secondary | ICD-10-CM | POA: Diagnosis not present

## 2022-02-16 ENCOUNTER — Other Ambulatory Visit (HOSPITAL_COMMUNITY): Payer: Self-pay | Admitting: Family Medicine

## 2022-02-16 DIAGNOSIS — Z1231 Encounter for screening mammogram for malignant neoplasm of breast: Secondary | ICD-10-CM

## 2022-03-09 ENCOUNTER — Ambulatory Visit (HOSPITAL_COMMUNITY)
Admission: RE | Admit: 2022-03-09 | Discharge: 2022-03-09 | Disposition: A | Discharge status: Home / Self Care | Payer: Managed Care, Other (non HMO) | Source: Ambulatory Visit | Attending: Family Medicine | Admitting: Family Medicine

## 2022-03-09 DIAGNOSIS — Z1231 Encounter for screening mammogram for malignant neoplasm of breast: Secondary | ICD-10-CM | POA: Insufficient documentation

## 2023-02-02 ENCOUNTER — Encounter (INDEPENDENT_AMBULATORY_CARE_PROVIDER_SITE_OTHER): Payer: Self-pay | Admitting: *Deleted

## 2023-02-16 ENCOUNTER — Other Ambulatory Visit (HOSPITAL_COMMUNITY): Payer: Self-pay | Admitting: Family Medicine

## 2023-02-16 DIAGNOSIS — Z1231 Encounter for screening mammogram for malignant neoplasm of breast: Secondary | ICD-10-CM

## 2023-03-01 ENCOUNTER — Telehealth (INDEPENDENT_AMBULATORY_CARE_PROVIDER_SITE_OTHER): Payer: Self-pay | Admitting: *Deleted

## 2023-03-01 NOTE — Telephone Encounter (Signed)
Who is your primary care physician: Almond Lint  Reasons for the colonoscopy: 3 yr recall  Have you had a colonoscopy before?  yes  Do you have family history of colon cancer? yes  Previous colonoscopy with polyps removed? yes  Do you have a history colorectal cancer?   no  Are you diabetic? If yes, Type 1 or Type 2?    no  Do you have a prosthetic or mechanical heart valve? no  Do you have a pacemaker/defibrillator?   no  Have you had endocarditis/atrial fibrillation? no  Have you had joint replacement within the last 12 months?  no  Do you tend to be constipated or have to use laxatives? no  Do you have any history of drugs or alchohol?  no  Do you use supplemental oxygen?  no  Have you had a stroke or heart attack within the last 6 months? no  Do you take weight loss medication?  no  For female patients: have you had a hysterectomy?  no                                     are you post menopausal?       no                                            do you still have your menstrual cycle? no      Do you take any blood-thinning medications such as: (aspirin, warfarin, Plavix, Aggrenox)  no  If yes we need the name, milligram, dosage and who is prescribing doctor  Current Outpatient Medications on File Prior to Visit  Medication Sig Dispense Refill   Cholecalciferol (DIALYVITE VITAMIN D 5000) 125 MCG (5000 UT) capsule Take 5,000 Units by mouth daily.     lisinopril (ZESTRIL) 5 MG tablet Take 5 mg by mouth daily.     Turmeric (QC TUMERIC COMPLEX PO) Take by mouth.     No current facility-administered medications on file prior to visit.    Allergies  Allergen Reactions   Hydrocodone Rash     Pharmacy: [facility] [place]

## 2023-03-05 MED ORDER — NA SULFATE-K SULFATE-MG SULF 17.5-3.13-1.6 GM/177ML PO SOLN: 1.0000 | Freq: Once | ORAL | 0 refills | Status: AC

## 2023-03-05 NOTE — Addendum Note (Signed)
Addended by: Armstead Peaks on: 03/05/2023 11:05 AM   Modules accepted: Orders

## 2023-03-05 NOTE — Telephone Encounter (Addendum)
Spoke with pt. Scheduled for 12/19. Aware will send instructions to her and rx to pharmacy  Checked [employer] and no PA required for TCS.

## 2023-03-08 NOTE — Telephone Encounter (Signed)
Questionnaire from recall, no referral needed  

## 2023-03-12 ENCOUNTER — Ambulatory Visit (HOSPITAL_COMMUNITY)
Admission: RE | Admit: 2023-03-12 | Discharge: 2023-03-12 | Disposition: A | Discharge status: Home / Self Care | Payer: Managed Care, Other (non HMO) | Source: Ambulatory Visit | Attending: Family Medicine | Admitting: Family Medicine

## 2023-03-12 ENCOUNTER — Encounter (HOSPITAL_COMMUNITY): Payer: Self-pay

## 2023-03-12 DIAGNOSIS — Z1231 Encounter for screening mammogram for malignant neoplasm of breast: Secondary | ICD-10-CM | POA: Diagnosis present

## 2023-04-05 ENCOUNTER — Encounter (HOSPITAL_COMMUNITY)
Admission: RE | Admit: 2023-04-05 | Discharge: 2023-04-05 | Disposition: A | Discharge status: Home / Self Care | Payer: Managed Care, Other (non HMO) | Source: Ambulatory Visit | Attending: Gastroenterology | Admitting: Gastroenterology

## 2023-04-05 ENCOUNTER — Encounter (HOSPITAL_COMMUNITY): Payer: Self-pay

## 2023-04-08 ENCOUNTER — Encounter (INDEPENDENT_AMBULATORY_CARE_PROVIDER_SITE_OTHER): Payer: Self-pay | Admitting: *Deleted

## 2023-04-08 ENCOUNTER — Encounter (HOSPITAL_COMMUNITY)
Admission: RE | Disposition: A | Discharge status: Home / Self Care | Payer: Self-pay | Source: Ambulatory Visit | Attending: Gastroenterology

## 2023-04-08 ENCOUNTER — Ambulatory Visit (HOSPITAL_COMMUNITY): Payer: Managed Care, Other (non HMO) | Admitting: Anesthesiology

## 2023-04-08 ENCOUNTER — Encounter (HOSPITAL_COMMUNITY): Payer: Self-pay

## 2023-04-08 ENCOUNTER — Ambulatory Visit (HOSPITAL_COMMUNITY)
Admission: RE | Admit: 2023-04-08 | Discharge: 2023-04-08 | Disposition: A | Discharge status: Home / Self Care | Payer: Managed Care, Other (non HMO) | Source: Ambulatory Visit | Attending: Gastroenterology | Admitting: Gastroenterology

## 2023-04-08 DIAGNOSIS — D120 Benign neoplasm of cecum: Secondary | ICD-10-CM | POA: Diagnosis not present

## 2023-04-08 DIAGNOSIS — K219 Gastro-esophageal reflux disease without esophagitis: Secondary | ICD-10-CM | POA: Insufficient documentation

## 2023-04-08 DIAGNOSIS — K635 Polyp of colon: Secondary | ICD-10-CM

## 2023-04-08 DIAGNOSIS — D125 Benign neoplasm of sigmoid colon: Secondary | ICD-10-CM | POA: Diagnosis not present

## 2023-04-08 DIAGNOSIS — Z1211 Encounter for screening for malignant neoplasm of colon: Secondary | ICD-10-CM

## 2023-04-08 DIAGNOSIS — Z86718 Personal history of other venous thrombosis and embolism: Secondary | ICD-10-CM | POA: Diagnosis not present

## 2023-04-08 DIAGNOSIS — D122 Benign neoplasm of ascending colon: Secondary | ICD-10-CM | POA: Diagnosis not present

## 2023-04-08 DIAGNOSIS — Z800 Family history of malignant neoplasm of digestive organs: Secondary | ICD-10-CM | POA: Insufficient documentation

## 2023-04-08 DIAGNOSIS — I10 Essential (primary) hypertension: Secondary | ICD-10-CM | POA: Diagnosis not present

## 2023-04-08 DIAGNOSIS — K5730 Diverticulosis of large intestine without perforation or abscess without bleeding: Secondary | ICD-10-CM | POA: Insufficient documentation

## 2023-04-08 HISTORY — PX: COLONOSCOPY WITH PROPOFOL: SHX5780

## 2023-04-08 HISTORY — PX: POLYPECTOMY: SHX5525

## 2023-04-08 LAB — HM COLONOSCOPY

## 2023-04-08 SURGERY — COLONOSCOPY WITH PROPOFOL
Anesthesia: General

## 2023-04-08 MED ORDER — PROPOFOL 500 MG/50ML IV EMUL
INTRAVENOUS | Status: DC | PRN
  Administered 2023-04-08: 125 ug/kg/min via INTRAVENOUS
  Administered 2023-04-08: 60 mg via INTRAVENOUS

## 2023-04-08 MED ORDER — LACTATED RINGERS IV SOLN: INTRAVENOUS | Status: DC | PRN

## 2023-04-08 MED ORDER — STERILE WATER FOR IRRIGATION IR SOLN
Status: DC | PRN
  Administered 2023-04-08: 120 mL

## 2023-04-08 NOTE — Anesthesia Preprocedure Evaluation (Signed)
Anesthesia Evaluation  Patient identified by MRN, date of birth, ID band Patient awake    Reviewed: Allergy & Precautions, H&P , NPO status , Patient's Chart, lab work & pertinent test results, reviewed documented beta blocker date and time   Airway Mallampati: II  TM Distance: >3 FB Neck ROM: full    Dental no notable dental hx.    Pulmonary neg pulmonary ROS   Pulmonary exam normal breath sounds clear to auscultation       Cardiovascular Exercise Tolerance: Good hypertension, negative cardio ROS  Rhythm:regular Rate:Normal     Neuro/Psych negative neurological ROS  negative psych ROS   GI/Hepatic negative GI ROS, Neg liver ROS,GERD  ,,  Endo/Other  negative endocrine ROS    Renal/GU negative Renal ROS  negative genitourinary   Musculoskeletal   Abdominal   Peds  Hematology negative hematology ROS (+)   Anesthesia Other Findings   Reproductive/Obstetrics negative OB ROS                             Anesthesia Physical Anesthesia Plan  ASA: 2  Anesthesia Plan: General   Post-op Pain Management:    Induction:   PONV Risk Score and Plan: Propofol infusion  Airway Management Planned:   Additional Equipment:   Intra-op Plan:   Post-operative Plan:   Informed Consent: I have reviewed the patients History and Physical, chart, labs and discussed the procedure including the risks, benefits and alternatives for the proposed anesthesia with the patient or authorized representative who has indicated his/her understanding and acceptance.     Dental Advisory Given  Plan Discussed with: CRNA  Anesthesia Plan Comments:        Anesthesia Quick Evaluation  

## 2023-04-08 NOTE — Op Note (Signed)
[facility] Patient Name: Danielle Gray Procedure Date: 04/08/2023 9:37 AM MRN: [id number] Date of Birth: Jan 09, 1951 Attending MD: Sanjuan Dame , MD, [id number] CSN: [id number] Age: 72 Admit Type: Outpatient Procedure:                Colonoscopy Indications:              Screening patient at increased risk: Family history                            of 1st-degree relative with colorectal cancer at                            age 30 years (or older), High risk colon cancer                            surveillance: Personal history of colonic polyps Providers:                Sanjuan Dame, MD, Francoise Ceo RN, RN, Zena Amos Referring MD:              Medicines:                Monitored Anesthesia Care Complications:            No immediate complications. Estimated Blood Loss:     Estimated blood loss was minimal. Procedure:                Pre-Anesthesia Assessment:                           - Prior to the procedure, a History and Physical                            was performed, and patient medications and                            allergies were reviewed. The patient's tolerance of                            previous anesthesia was also reviewed. The risks                            and benefits of the procedure and the sedation                            options and risks were discussed with the patient.                            All questions were answered, and informed consent                            was obtained. Prior Anticoagulants: The patient has  taken no anticoagulant or antiplatelet agents. ASA                            Grade Assessment: II - A patient with mild systemic                            disease. After reviewing the risks and benefits,                            the patient was deemed in satisfactory condition to                            undergo the procedure.                           After  obtaining informed consent, the colonoscope                            was passed under direct vision. Throughout the                            procedure, the patient's blood pressure, pulse, and                            oxygen saturations were monitored continuously. The                            [id number]) scope was introduced through the                            anus and advanced to the the terminal ileum. The                            colonoscopy was performed without difficulty. The                            patient tolerated the procedure well. The quality                            of the bowel preparation was evaluated using the                            BBPS [facility] Bowel Preparation Scale) with scores                            of: Right Colon = 2 (minor amount of residual                            staining, small fragments of stool and/or opaque                            liquid, but mucosa seen well), Transverse Colon = 2                            (  minor amount of residual staining, small fragments                            of stool and/or opaque liquid, but mucosa seen                            well) and Left Colon = 2 (minor amount of residual                            staining, small fragments of stool and/or opaque                            liquid, but mucosa seen well). The total BBPS score                            equals 6. The terminal ileum, ileocecal valve,                            appendiceal orifice, and rectum were photographed. Scope In: 9:51:24 AM Scope Out: 10:12:19 AM Scope Withdrawal Time: 0 hours 16 minutes 47 seconds  Total Procedure Duration: 0 hours 20 minutes 55 seconds  Findings:      The perianal and digital rectal examinations were normal.      Three sessile polyps were found in the sigmoid colon, ascending colon       and cecum. The polyps were 3 to 5 mm in size. These polyps were removed       with a cold snare. Resection  and retrieval were complete.      A few diverticula were found in the left colon.      Copious quantities of stool was found in the entire colon, precluding       visualization. Lavage of the area was performed using a large amount of       sterile water, resulting in clearance with good visualization. Impression:               - Three 3 to 5 mm polyps in the sigmoid colon, in                            the ascending colon and in the cecum, removed with                            a cold snare. Resected and retrieved.                           - Diverticulosis in the left colon.                           - Stool in the entire examined colon. Moderate Sedation:      Per Anesthesia Care Recommendation:           - Patient has a contact number available for                            emergencies. The signs and symptoms of potential  delayed complications were discussed with the                            patient. Return to normal activities tomorrow.                            Written discharge instructions were provided to the                            patient.                           - Resume previous diet.                           - Continue present medications.                           - Await pathology results.                           - Repeat colonoscopy in 3 years for surveillance                            given history of high polyp burden and fair prep                            here . ( Next time with extended bowel prep )                           - Return to primary care physician as previously                            scheduled.                           - Patient will let us know if she would like to be                            reffered for Genetic testing given high polyp                            burden ( >12 TA) and family history of colon cancer.                           -All pictures did not tranfer over from the tower                             to the computer given techinical difficulties Procedure Code(s):        --- Professional ---                           [id number], Colonoscopy, flexible; with removal of  tumor(s), polyp(s), or other lesion(s) by snare                            technique Diagnosis Code(s):        --- Professional ---                           Z80.0, Family history of malignant neoplasm of                            digestive organs                           Z86.010, Personal history of colonic polyps                           D12.5, Benign neoplasm of sigmoid colon                           D12.2, Benign neoplasm of ascending colon                           D12.0, Benign neoplasm of cecum                           K57.30, Diverticulosis of large intestine without                            perforation or abscess without bleeding CPT copyright 2022 American Medical Association. All rights reserved. The codes documented in this report are preliminary and upon coder review may  be revised to meet current compliance requirements. Sanjuan Dame, MD Sanjuan Dame, MD 04/08/2023 10:21:28 AM This report has been signed electronically. Number of Addenda: 0

## 2023-04-08 NOTE — Transfer of Care (Signed)
Immediate Anesthesia Transfer of Care Note  Patient: Danielle Gray  Procedure(s) Performed: COLONOSCOPY WITH PROPOFOL POLYPECTOMY  Patient Location: PACU  Anesthesia Type:General  Level of Consciousness: awake, alert , and oriented  Airway & Oxygen Therapy: Patient Spontanous Breathing  Post-op Assessment: Report given to RN, Post -op Vital signs reviewed and stable, and Patient moving all extremities X 4  Post vital signs: Reviewed and stable  Last Vitals:  Vitals Value Taken Time  BP 96/47 04/08/23 1016  Temp 98.2   Pulse 76 04/08/23 1018  Resp 15 04/08/23 1018  SpO2 100 % 04/08/23 1018  Vitals shown include unfiled device data.  Last Pain:  Vitals:   04/08/23 0947  TempSrc:   PainSc: 0-No pain         Complications: No notable events documented.

## 2023-04-08 NOTE — H&P (Signed)
Primary Care Physician:  Richardean Chimera, MD Primary Gastroenterologist:  Dr. Tasia Catchings  Pre-Procedure History & Physical: HPI:  Danielle Gray is a 72 y.o. female is here for a colonoscopy for colon cancer screening purposes.  History significant for colon carcinoma and her mother who who was 54 at the time of diagnosis. Maternal grandmother also had colon carcinoma and she was possibly in her late 4s.    No melena or hematochezia.  No abdominal pain or unintentional weight loss.  No change in bowel habits.  Overall feels well from a GI standpoint.  2021  - Five small polyps at the hepatic flexure and in the ascending colon, removed with a cold snare. Resected and retrieved. - Two diminutive polyps at the hepatic flexure and in the ascending colon. Biopsied. - Three small polyps in the descending colon, at the splenic flexure and in the transverse colon, removed with a cold snare. Resected and retrieved. - One diminutive polyp in the descending colon. Biopsied. - Diverticulosis in the sigmoid colon. - External hemorrhoids.  Comment: Patient had 11 polyps altogether.  Patient had 11 small polyps removed and they are all tubular adenomas Next colonoscopy in 3 years.  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   right    Hypertension    changed diet, weight loss, no longer prescribed medication   Lumbar herniated disc    Spinal stenosis    Spondylosis     Past Surgical History:  Procedure Laterality Date   BLADDER TACK  25 YRS AGO   BREAST BIOPSY     COLONOSCOPY N/A 03/07/2020   Procedure: COLONOSCOPY;  Surgeon: Malissa Hippo, MD;  Location: [facility];  Service: Endoscopy;  Laterality: N/A;  830   colonscopy      JOINT REPLACEMENT  2006   RT TOTAL KNEE   KNEE ARTHROPLASTY Left 10/28/2017   Procedure: LEFT TOTAL KNEE ARTHROPLASTY WITH COMPUTER NAVIGATION;  Surgeon:  Samson Frederic, MD;  Location: [facility];  Service: Orthopedics;  Laterality: Left;  Needs RNFA   LUMBAR LAMINECTOMY/DECOMPRESSION MICRODISCECTOMY Left 05/22/2015   Procedure: CENTRAL DECOMPRESSION LUMBAR LAMINECTOMY L3-4 AND L4-5, 2 LEVEL SPINAL STENOSIS WITH MICRODISCECTOMY L3-4 ON LEFT AND FORAMINOTOMY L3-4 AND L4-5 LEFT;  Surgeon: Ranee Gosselin, MD;  Location: [facility];  Service: Orthopedics;  Laterality: Left;   POLYPECTOMY  03/07/2020   Procedure: POLYPECTOMY;  Surgeon: Malissa Hippo, MD;  [facility]: [facility];  Service: Endoscopy;;   TOTAL HIP ARTHROPLASTY Left 03/26/2016   Procedure: LEFT TOTAL HIP ARTHROPLASTY ANTERIOR APPROACH;  Surgeon: Samson Frederic, MD;  Location: [facility];  Service: Orthopedics;  Laterality: Left;  Nedds RNFA   TOTAL HIP ARTHROPLASTY Right 12/03/2016   Procedure: RIGHT TOTAL HIP ARTHROPLASTY ANTERIOR APPROACH;  Surgeon: Samson Frederic, MD;  Location: [facility];  Service: Orthopedics;  Laterality: Right;  Needs RNFA   TOTAL KNEE ARTHROPLASTY Right    Dr. Thomasena Edis   TUBAL LIGATION     UPPER GI ENDOSCOPY      Prior to Admission medications   Medication Sig Start Date End Date Taking? Authorizing Provider  Boswellia-Glucosamine-Vit D (OSTEO BI-FLEX ONE PER DAY PO) Take by mouth.   Yes [provider]  Cholecalciferol (DIALYVITE VITAMIN D 5000) 125 MCG (5000 UT) capsule Take 5,000 Units by mouth daily.   Yes [provider]  lisinopril (ZESTRIL)  5 MG tablet Take 5 mg by mouth daily.   Yes [provider]  Turmeric (QC TUMERIC COMPLEX PO) Take by mouth.   Yes [provider]  vitamin B-12 (CYANOCOBALAMIN) 50 MCG tablet Take 50 mcg by mouth daily.   Yes [provider]    Allergies as of 03/05/2023 - Review Complete 03/01/2023  Allergen Reaction Noted   Hydrocodone Rash 03/04/2020    History reviewed. No pertinent family history.  Social History   Socioeconomic History   Marital status: Married    Spouse name: Not on  file   Number of children: Not on file   Years of education: Not on file   Highest education level: Not on file  Occupational History   Not on file  Tobacco Use   Smoking status: Never   Smokeless tobacco: Never  Vaping Use   Vaping status: Never Used  Substance and Sexual Activity   Alcohol use: Yes    Comment: OCCASIONAL   Drug use: No   Sexual activity: Not on file  Other Topics Concern   Not on file  Social History Narrative   Not on file   Social Drivers of Health   Financial Resource Strain: Not on file  Food Insecurity: Not on file  Transportation Needs: Not on file  Physical Activity: Not on file  Stress: Not on file  Social Connections: Not on file  Intimate Partner Violence: Not on file    Review of Systems: See HPI, otherwise negative ROS  Physical Exam: Vital signs in last 24 hours: Temp:  [98.2 F (36.8 C)] 98.2 F (36.8 C) (12/19 0841) Pulse Rate:  [88] 88 (12/19 0841) Resp:  [21] 21 (12/19 0841) BP: (111)/(50) 111/50 (12/19 0841) SpO2:  [96 %] 96 % (12/19 0841) Weight:  [87.9 kg] 87.9 kg (12/19 0841)   General:   Alert,  Well-developed, well-nourished, pleasant and cooperative in NAD Head:  Normocephalic and atraumatic. Eyes:  Sclera clear, no icterus.   Conjunctiva pink. Ears:  Normal auditory acuity. Nose:  No deformity, discharge,  or lesions. Msk:  Symmetrical without gross deformities. Normal posture. Extremities:  Without clubbing or edema. Neurologic:  Alert and  oriented x4;  grossly normal neurologically. Skin:  Intact without significant lesions or rashes. Psych:  Alert and cooperative. Normal mood and affect.  Impression/Plan: Danielle Gray is here for a colonoscopy to be performed for colon cancer screening purposes.  The risks of the procedure including infection, bleed, or perforation as well as benefits, limitations, alternatives and imponderables have been reviewed with the patient. Questions have been answered. All parties  agreeable.

## 2023-04-08 NOTE — Discharge Instructions (Signed)

## 2023-04-09 LAB — SURGICAL PATHOLOGY

## 2023-04-09 NOTE — Anesthesia Postprocedure Evaluation (Signed)
Anesthesia Post Note  Patient: Danielle Gray  Procedure(s) Performed: COLONOSCOPY WITH PROPOFOL POLYPECTOMY  Patient location during evaluation: Phase II Anesthesia Type: General Level of consciousness: awake Pain management: pain level controlled Vital Signs Assessment: post-procedure vital signs reviewed and stable Respiratory status: spontaneous breathing and respiratory function stable Cardiovascular status: blood pressure returned to baseline and stable Postop Assessment: no headache and no apparent nausea or vomiting Anesthetic complications: no Comments: Late entry   No notable events documented.   Last Vitals:  Vitals:   04/08/23 1016 04/08/23 1021  BP: (!) 96/47 (!) 119/52  Pulse: 72 68  Resp: 15 13  Temp: 36.8 C   SpO2: 100%     Last Pain:  Vitals:   04/08/23 1016  TempSrc: Oral  PainSc: 0-No pain                 Windell Norfolk

## 2023-04-15 ENCOUNTER — Encounter (INDEPENDENT_AMBULATORY_CARE_PROVIDER_SITE_OTHER): Payer: Self-pay | Admitting: *Deleted

## 2023-04-15 NOTE — Progress Notes (Signed)
I reviewed the pathology results. [name], can you send her a letter with the findings as described below please?  Repeat colonoscopy in 3 years  Thanks,  [name], MD Gastroenterology and Hepatology [facility]  ---------------------------------------------------------------------------------------------  [facility] [address], [address], [place], [place] [address] Phone:  [phone]   04/15/23 [name], [place]   Dear Danielle Gray,  I am writing to inform you that the biopsies taken during your recent endoscopic examination showed:  I am writing to let you know the results of your recent colonoscopy.  You had a total of 3 polyps removed. 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  Given these findings, it is recommended that your next colonoscopy be performed in 3 years.  Also I recommend genetic testing given family history of colon cancer and your yourself having high burden of colon polyps . As discussed let us know if you would like to be referred for this   Also I value your feedback , so if you get a survey , please take the time to fill it out and thank you for choosing [facility]  Please call us at [phone] if you have persistent problems or have questions about your condition that have not been fully answered at this time.  Sincerely,  [name], MD Gastroenterology and Hepatology

## 2023-04-28 IMAGING — MG MM DIGITAL SCREENING BILAT W/ TOMO AND CAD
6 of 12 series · 6 of 36 positions shown · non-contrast
Comparison: Previous exam(s).

CLINICAL DATA: Screening.

EXAM:
DIGITAL SCREENING BILATERAL MAMMOGRAM WITH TOMOSYNTHESIS AND CAD
TECHNIQUE: Bilateral screening digital craniocaudal and mediolateral oblique
mammograms were obtained. Bilateral screening digital breast
tomosynthesis was performed. The images were evaluated with
computer-aided detection.

[R MLO synth-2D (1 of 3)]
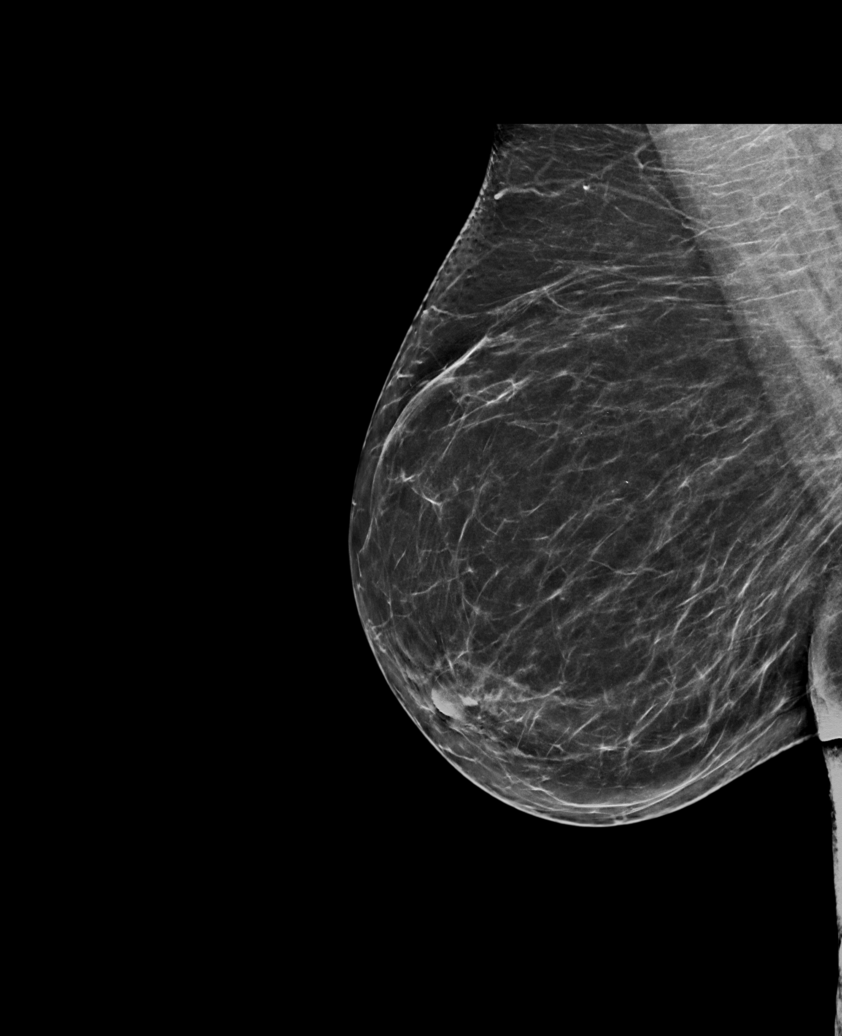

[R MLO synth-2D (2 of 3)]
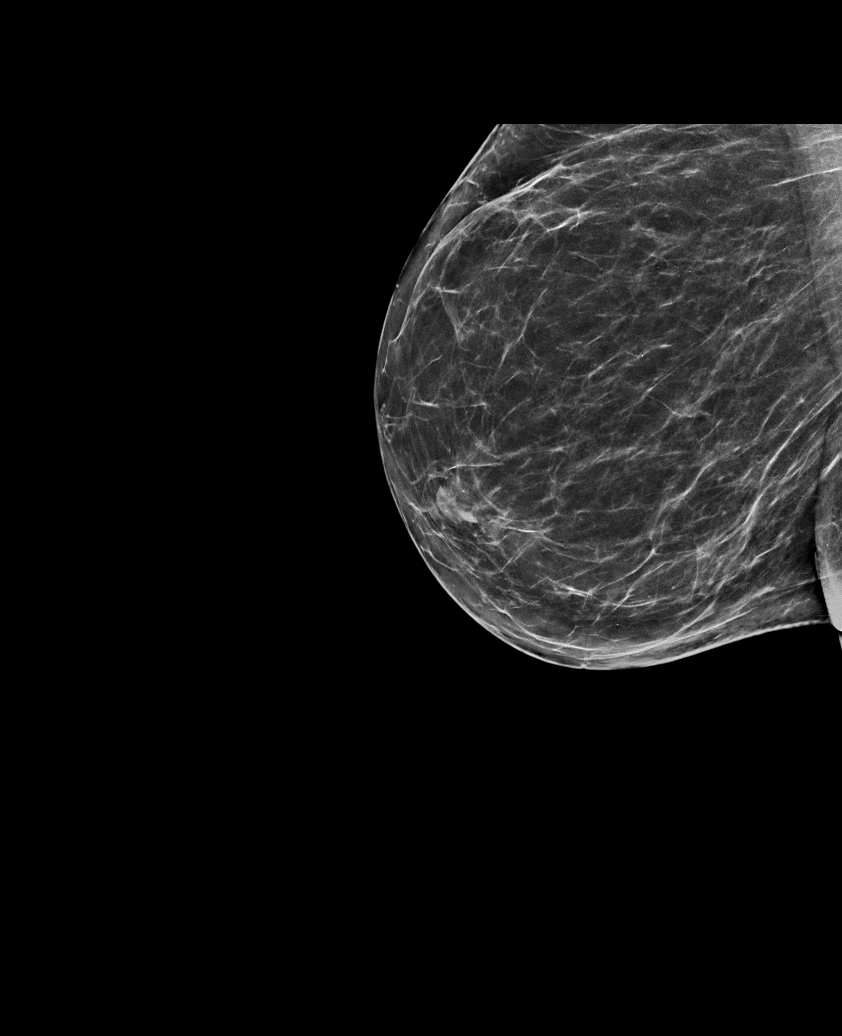

[L MLO synth-2D]
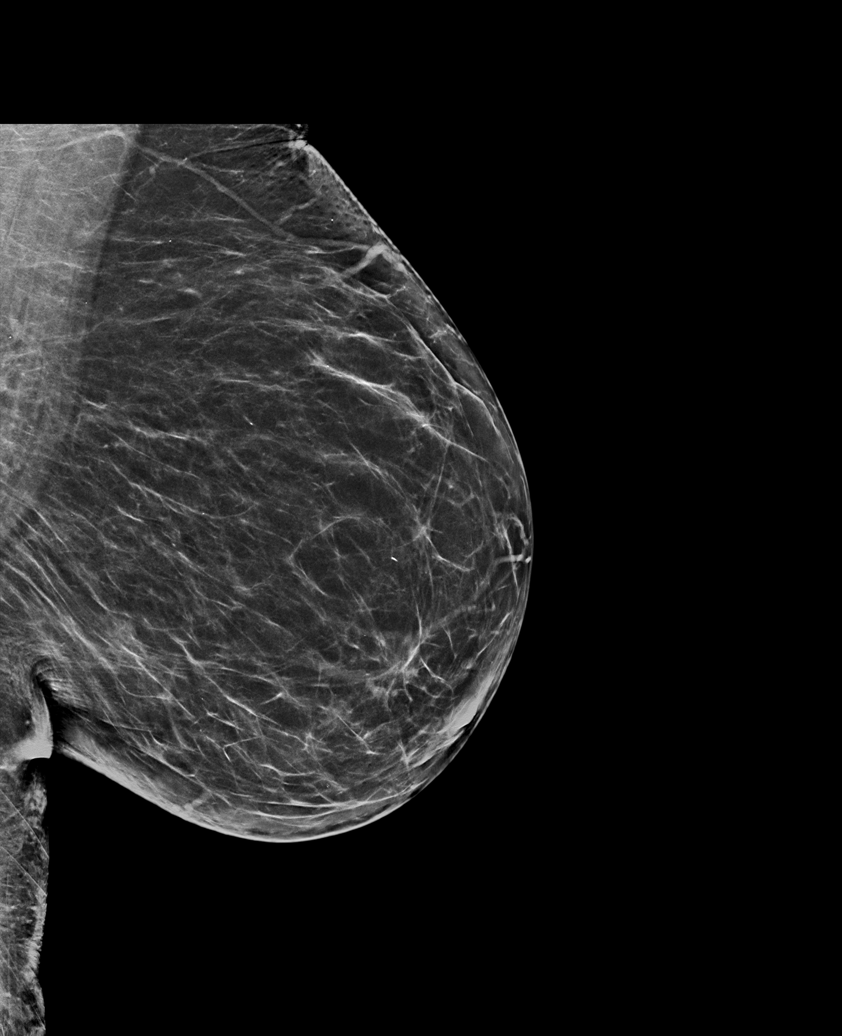

[L CC synth-2D]
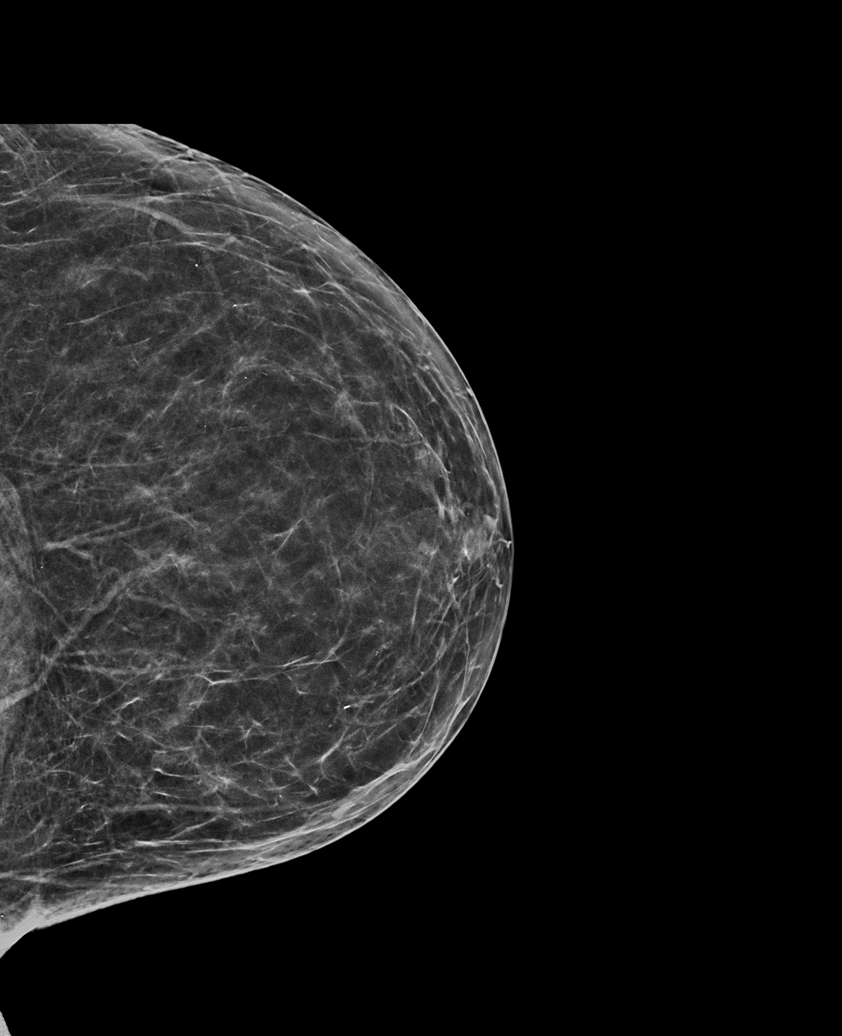

[R MLO synth-2D (3 of 3)]
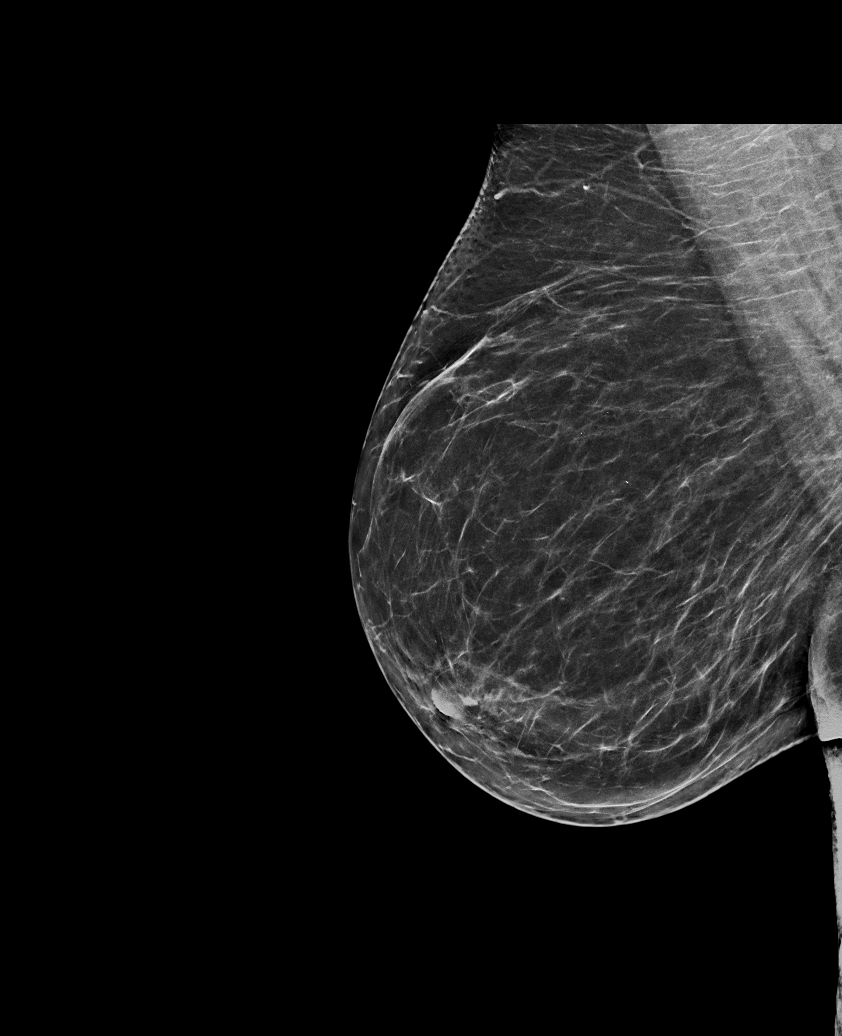

[R CC synth-2D]
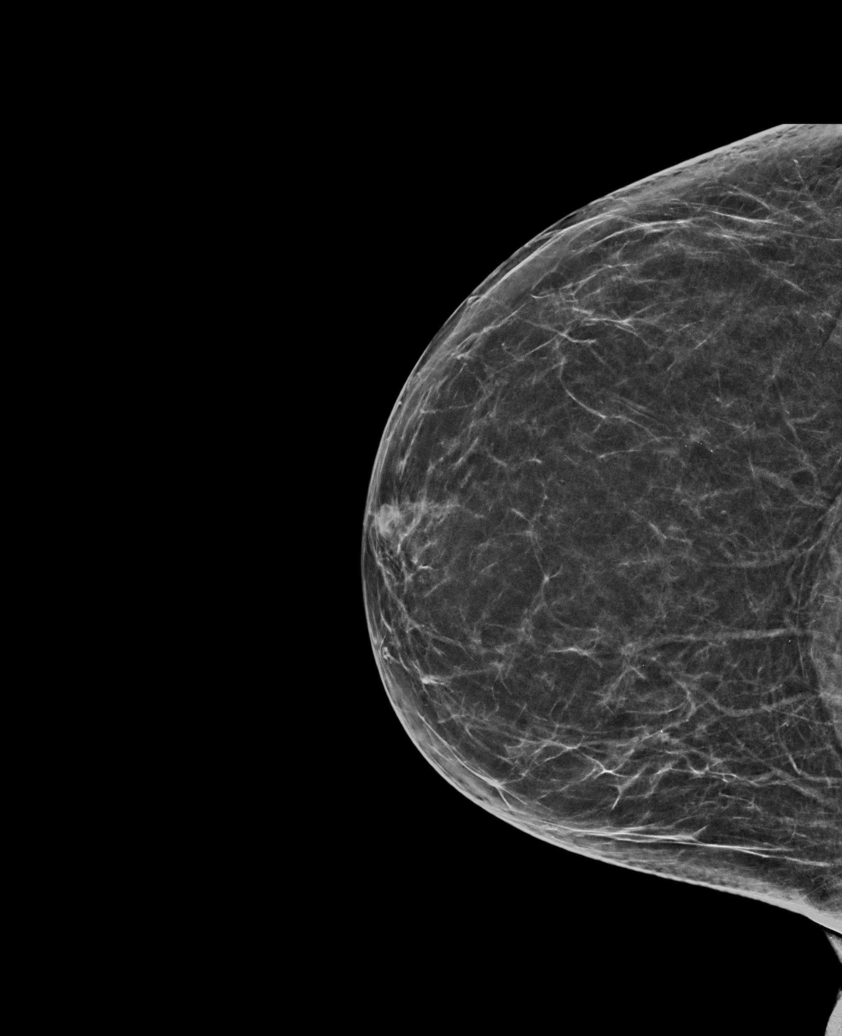

[6 of 36 positions shown; findings below may reference images not displayed]

ACR Breast Density Category b: There are scattered areas of
fibroglandular density.
FINDINGS: There are no findings suspicious for malignancy.
IMPRESSION: No mammographic evidence of malignancy. A result letter of this
screening mammogram will be mailed directly to the patient.

RECOMMENDATION:
Screening mammogram in one year. (Code:51-O-LD2)

BI-RADS CATEGORY  1: Negative.

## 2023-04-29 ENCOUNTER — Encounter (HOSPITAL_COMMUNITY): Payer: Self-pay | Admitting: Gastroenterology

## 2023-07-05 DIAGNOSIS — L988 Other specified disorders of the skin and subcutaneous tissue: Secondary | ICD-10-CM | POA: Diagnosis not present

## 2023-09-15 DIAGNOSIS — E559 Vitamin D deficiency, unspecified: Secondary | ICD-10-CM | POA: Diagnosis not present

## 2023-09-15 DIAGNOSIS — E039 Hypothyroidism, unspecified: Secondary | ICD-10-CM | POA: Diagnosis not present

## 2023-09-15 DIAGNOSIS — Z0001 Encounter for general adult medical examination with abnormal findings: Secondary | ICD-10-CM | POA: Diagnosis not present

## 2023-09-15 DIAGNOSIS — I10 Essential (primary) hypertension: Secondary | ICD-10-CM | POA: Diagnosis not present

## 2023-09-15 DIAGNOSIS — E7849 Other hyperlipidemia: Secondary | ICD-10-CM | POA: Diagnosis not present

## 2023-09-21 ENCOUNTER — Other Ambulatory Visit (HOSPITAL_COMMUNITY): Payer: Self-pay | Admitting: Family Medicine

## 2023-09-21 DIAGNOSIS — M810 Age-related osteoporosis without current pathological fracture: Secondary | ICD-10-CM

## 2023-09-21 DIAGNOSIS — Z0001 Encounter for general adult medical examination with abnormal findings: Secondary | ICD-10-CM | POA: Diagnosis not present

## 2023-09-21 DIAGNOSIS — Z1389 Encounter for screening for other disorder: Secondary | ICD-10-CM | POA: Diagnosis not present

## 2023-09-21 DIAGNOSIS — I10 Essential (primary) hypertension: Secondary | ICD-10-CM | POA: Diagnosis not present

## 2023-09-21 DIAGNOSIS — Z1231 Encounter for screening mammogram for malignant neoplasm of breast: Secondary | ICD-10-CM

## 2023-09-21 DIAGNOSIS — E7849 Other hyperlipidemia: Secondary | ICD-10-CM | POA: Diagnosis not present

## 2023-09-21 DIAGNOSIS — E782 Mixed hyperlipidemia: Secondary | ICD-10-CM | POA: Diagnosis not present

## 2023-11-02 DIAGNOSIS — H5203 Hypermetropia, bilateral: Secondary | ICD-10-CM | POA: Diagnosis not present

## 2023-11-02 DIAGNOSIS — H35031 Hypertensive retinopathy, right eye: Secondary | ICD-10-CM | POA: Diagnosis not present

## 2023-12-30 DIAGNOSIS — M180 Bilateral primary osteoarthritis of first carpometacarpal joints: Secondary | ICD-10-CM | POA: Diagnosis not present

## 2023-12-30 DIAGNOSIS — M65331 Trigger finger, right middle finger: Secondary | ICD-10-CM | POA: Diagnosis not present

## 2023-12-30 DIAGNOSIS — M79641 Pain in right hand: Secondary | ICD-10-CM | POA: Diagnosis not present

## 2023-12-30 DIAGNOSIS — M65332 Trigger finger, left middle finger: Secondary | ICD-10-CM | POA: Diagnosis not present

## 2023-12-30 DIAGNOSIS — M1811 Unilateral primary osteoarthritis of first carpometacarpal joint, right hand: Secondary | ICD-10-CM | POA: Diagnosis not present

## 2024-02-04 DIAGNOSIS — M65331 Trigger finger, right middle finger: Secondary | ICD-10-CM | POA: Diagnosis not present

## 2024-02-04 DIAGNOSIS — M19041 Primary osteoarthritis, right hand: Secondary | ICD-10-CM | POA: Diagnosis not present

## 2024-03-13 ENCOUNTER — Ambulatory Visit (HOSPITAL_COMMUNITY)
Admission: RE | Admit: 2024-03-13 | Discharge: 2024-03-13 | Disposition: A | Discharge status: Home / Self Care | Source: Ambulatory Visit | Attending: Family Medicine | Admitting: Family Medicine

## 2024-03-13 ENCOUNTER — Ambulatory Visit (HOSPITAL_COMMUNITY)
Admission: RE | Admit: 2024-03-13 | Discharge: 2024-03-13 | Disposition: A | Source: Ambulatory Visit | Attending: Family Medicine | Admitting: Family Medicine

## 2024-03-13 DIAGNOSIS — M810 Age-related osteoporosis without current pathological fracture: Secondary | ICD-10-CM

## 2024-03-13 DIAGNOSIS — Z1231 Encounter for screening mammogram for malignant neoplasm of breast: Secondary | ICD-10-CM | POA: Insufficient documentation
